# Patient Record
Sex: Male | Born: 1965
Health system: Southern US, Community
[De-identification: ages and names within clinical notes are randomized; demographics above are authoritative.]

## PROBLEM LIST (undated history)

## (undated) DIAGNOSIS — I509 Heart failure, unspecified: Secondary | ICD-10-CM

## (undated) DIAGNOSIS — G4730 Sleep apnea, unspecified: Secondary | ICD-10-CM

## (undated) DIAGNOSIS — I10 Essential (primary) hypertension: Secondary | ICD-10-CM

## (undated) DIAGNOSIS — N419 Inflammatory disease of prostate, unspecified: Secondary | ICD-10-CM

## (undated) DIAGNOSIS — F329 Major depressive disorder, single episode, unspecified: Secondary | ICD-10-CM

## (undated) DIAGNOSIS — I743 Embolism and thrombosis of arteries of the lower extremities: Secondary | ICD-10-CM

## (undated) DIAGNOSIS — J40 Bronchitis, not specified as acute or chronic: Secondary | ICD-10-CM

## (undated) DIAGNOSIS — M109 Gout, unspecified: Secondary | ICD-10-CM

## (undated) DIAGNOSIS — J302 Other seasonal allergic rhinitis: Secondary | ICD-10-CM

## (undated) DIAGNOSIS — F32A Depression, unspecified: Secondary | ICD-10-CM

## (undated) DIAGNOSIS — J45909 Unspecified asthma, uncomplicated: Secondary | ICD-10-CM

## (undated) HISTORY — DX: Unspecified asthma, uncomplicated: J45.909

## (undated) HISTORY — DX: Embolism and thrombosis of arteries of the lower extremities: I74.3

## (undated) HISTORY — DX: Sleep apnea, unspecified: G47.30

## (undated) HISTORY — DX: Morbid (severe) obesity due to excess calories: E66.01

---

## 2013-01-11 DIAGNOSIS — M25559 Pain in unspecified hip: Secondary | ICD-10-CM | POA: Insufficient documentation

## 2013-01-11 DIAGNOSIS — H6692 Otitis media, unspecified, left ear: Secondary | ICD-10-CM | POA: Insufficient documentation

## 2013-01-11 DIAGNOSIS — B070 Plantar wart: Secondary | ICD-10-CM | POA: Insufficient documentation

## 2014-07-24 ENCOUNTER — Emergency Department (HOSPITAL_COMMUNITY): Payer: Medicaid Other

## 2014-07-24 ENCOUNTER — Inpatient Hospital Stay (HOSPITAL_COMMUNITY)
Admission: EM | Admit: 2014-07-24 | Discharge: 2014-08-05 | DRG: 253 | Disposition: A | Discharge status: Home / Self Care | Payer: Medicaid Other | Attending: Internal Medicine | Admitting: Internal Medicine

## 2014-07-24 ENCOUNTER — Encounter (HOSPITAL_COMMUNITY): Payer: Self-pay

## 2014-07-24 DIAGNOSIS — I743 Embolism and thrombosis of arteries of the lower extremities: Principal | ICD-10-CM | POA: Diagnosis present

## 2014-07-24 DIAGNOSIS — E876 Hypokalemia: Secondary | ICD-10-CM | POA: Diagnosis present

## 2014-07-24 DIAGNOSIS — N419 Inflammatory disease of prostate, unspecified: Secondary | ICD-10-CM | POA: Diagnosis present

## 2014-07-24 DIAGNOSIS — M1000 Idiopathic gout, unspecified site: Secondary | ICD-10-CM

## 2014-07-24 DIAGNOSIS — J302 Other seasonal allergic rhinitis: Secondary | ICD-10-CM | POA: Diagnosis present

## 2014-07-24 DIAGNOSIS — M109 Gout, unspecified: Secondary | ICD-10-CM | POA: Diagnosis present

## 2014-07-24 DIAGNOSIS — R52 Pain, unspecified: Secondary | ICD-10-CM

## 2014-07-24 DIAGNOSIS — Z79899 Other long term (current) drug therapy: Secondary | ICD-10-CM

## 2014-07-24 DIAGNOSIS — Z7951 Long term (current) use of inhaled steroids: Secondary | ICD-10-CM

## 2014-07-24 DIAGNOSIS — R339 Retention of urine, unspecified: Secondary | ICD-10-CM | POA: Diagnosis not present

## 2014-07-24 DIAGNOSIS — N179 Acute kidney failure, unspecified: Secondary | ICD-10-CM

## 2014-07-24 DIAGNOSIS — I739 Peripheral vascular disease, unspecified: Secondary | ICD-10-CM | POA: Diagnosis present

## 2014-07-24 DIAGNOSIS — R1031 Right lower quadrant pain: Secondary | ICD-10-CM

## 2014-07-24 DIAGNOSIS — N411 Chronic prostatitis: Secondary | ICD-10-CM | POA: Insufficient documentation

## 2014-07-24 DIAGNOSIS — I2699 Other pulmonary embolism without acute cor pulmonale: Secondary | ICD-10-CM

## 2014-07-24 DIAGNOSIS — F329 Major depressive disorder, single episode, unspecified: Secondary | ICD-10-CM | POA: Diagnosis present

## 2014-07-24 DIAGNOSIS — Z6841 Body Mass Index (BMI) 40.0 and over, adult: Secondary | ICD-10-CM

## 2014-07-24 DIAGNOSIS — J40 Bronchitis, not specified as acute or chronic: Secondary | ICD-10-CM | POA: Diagnosis present

## 2014-07-24 DIAGNOSIS — G4733 Obstructive sleep apnea (adult) (pediatric): Secondary | ICD-10-CM | POA: Diagnosis present

## 2014-07-24 DIAGNOSIS — J45909 Unspecified asthma, uncomplicated: Secondary | ICD-10-CM | POA: Diagnosis present

## 2014-07-24 DIAGNOSIS — M79661 Pain in right lower leg: Secondary | ICD-10-CM | POA: Diagnosis present

## 2014-07-24 DIAGNOSIS — Z9989 Dependence on other enabling machines and devices: Secondary | ICD-10-CM

## 2014-07-24 DIAGNOSIS — R1030 Lower abdominal pain, unspecified: Secondary | ICD-10-CM | POA: Diagnosis present

## 2014-07-24 DIAGNOSIS — I10 Essential (primary) hypertension: Secondary | ICD-10-CM | POA: Diagnosis present

## 2014-07-24 HISTORY — DX: Depression, unspecified: F32.A

## 2014-07-24 HISTORY — DX: Gout, unspecified: M10.9

## 2014-07-24 HISTORY — DX: Inflammatory disease of prostate, unspecified: N41.9

## 2014-07-24 HISTORY — DX: Bronchitis, not specified as acute or chronic: J40

## 2014-07-24 HISTORY — DX: Other seasonal allergic rhinitis: J30.2

## 2014-07-24 HISTORY — DX: Major depressive disorder, single episode, unspecified: F32.9

## 2014-07-24 HISTORY — DX: Essential (primary) hypertension: I10

## 2014-07-24 LAB — COMPREHENSIVE METABOLIC PANEL
ALBUMIN: 3.5 g/dL (ref 3.5–5.2)
ALT: 21 U/L (ref 0–53)
AST: 22 U/L (ref 0–37)
Alkaline Phosphatase: 70 U/L (ref 39–117)
Anion gap: 9 (ref 5–15)
BUN: 19 mg/dL (ref 6–23)
CALCIUM: 8.6 mg/dL (ref 8.4–10.5)
CO2: 30 mmol/L (ref 19–32)
CREATININE: 1.19 mg/dL (ref 0.50–1.35)
Chloride: 103 mmol/L (ref 96–112)
GFR calc Af Amer: 81 mL/min — ABNORMAL LOW (ref >90)
GFR calc non Af Amer: 70 mL/min — ABNORMAL LOW (ref >90)
GLUCOSE: 155 mg/dL — AB (ref 70–99)
POTASSIUM: 3.2 mmol/L — AB (ref 3.5–5.1)
Sodium: 142 mmol/L (ref 135–145)
Total Bilirubin: 0.6 mg/dL (ref 0.3–1.2)
Total Protein: 7.2 g/dL (ref 6.0–8.3)

## 2014-07-24 LAB — CBC WITH DIFFERENTIAL/PLATELET
BASOS ABS: 0 10*3/uL (ref 0.0–0.1)
Basophils Relative: 0 % (ref 0–1)
Eosinophils Absolute: 0.3 10*3/uL (ref 0.0–0.7)
Eosinophils Relative: 3 % (ref 0–5)
HCT: 39.9 % (ref 39.0–52.0)
Hemoglobin: 12.3 g/dL — ABNORMAL LOW (ref 13.0–17.0)
Lymphocytes Relative: 15 % (ref 12–46)
Lymphs Abs: 1.7 10*3/uL (ref 0.7–4.0)
MCH: 27.8 pg (ref 26.0–34.0)
MCHC: 30.8 g/dL (ref 30.0–36.0)
MCV: 90.3 fL (ref 78.0–100.0)
MONO ABS: 0.8 10*3/uL (ref 0.1–1.0)
Monocytes Relative: 7 % (ref 3–12)
NEUTROS ABS: 9 10*3/uL — AB (ref 1.7–7.7)
Neutrophils Relative %: 75 % (ref 43–77)
Platelets: 242 10*3/uL (ref 150–400)
RBC: 4.42 MIL/uL (ref 4.22–5.81)
RDW: 15 % (ref 11.5–15.5)
WBC: 11.9 10*3/uL — AB (ref 4.0–10.5)

## 2014-07-24 LAB — URINE MICROSCOPIC-ADD ON

## 2014-07-24 LAB — URINALYSIS, ROUTINE W REFLEX MICROSCOPIC
GLUCOSE, UA: NEGATIVE mg/dL
Hgb urine dipstick: NEGATIVE
Ketones, ur: NEGATIVE mg/dL
LEUKOCYTES UA: NEGATIVE
Nitrite: NEGATIVE
PH: 5.5 (ref 5.0–8.0)
Protein, ur: 30 mg/dL — AB
Specific Gravity, Urine: 1.028 (ref 1.005–1.030)
Urobilinogen, UA: 1 mg/dL (ref 0.0–1.0)

## 2014-07-24 LAB — LIPASE, BLOOD: Lipase: 27 U/L (ref 11–59)

## 2014-07-24 LAB — I-STAT TROPONIN, ED: Troponin i, poc: 0 ng/mL (ref 0.00–0.08)

## 2014-07-24 MED ORDER — SODIUM CHLORIDE 0.9 % IV SOLN
Freq: Once | INTRAVENOUS | Status: AC
  Administered 2014-07-24: 22:00:00 via INTRAVENOUS

## 2014-07-24 MED ORDER — KETOROLAC TROMETHAMINE 30 MG/ML IJ SOLN
30.0000 mg | Freq: Once | INTRAMUSCULAR | Status: AC
  Administered 2014-07-24: 30 mg via INTRAVENOUS
  Filled 2014-07-24: qty 1

## 2014-07-24 MED ORDER — ONDANSETRON HCL 4 MG/2ML IJ SOLN
4.0000 mg | Freq: Once | INTRAMUSCULAR | Status: AC
  Administered 2014-07-24: 4 mg via INTRAVENOUS
  Filled 2014-07-24: qty 2

## 2014-07-24 MED ORDER — IOHEXOL 300 MG/ML  SOLN
125.0000 mL | Freq: Once | INTRAMUSCULAR | Status: AC | PRN
  Administered 2014-07-24: 125 mL via INTRAVENOUS

## 2014-07-24 NOTE — ED Notes (Signed)
Patient arrives by EMS with complaints of RLQ pain and groin pain/pain with urination.  Symptoms started last Sunday.

## 2014-07-24 NOTE — ED Notes (Signed)
Bed: WA01 Expected date:  Expected time:  Means of arrival:  Comments: EMS male abdominal pain

## 2014-07-24 NOTE — ED Notes (Signed)
Provider did not want EKG done.  Stored if other symptoms develop.

## 2014-07-24 NOTE — ED Provider Notes (Addendum)
CSN: [id number]     Arrival date & time 07/24/14  2056 History   First MD Initiated Contact with Patient 07/24/14 2104     Chief Complaint  Patient presents with  . Groin Pain  . Abdominal Pain     (Consider location/radiation/quality/duration/timing/severity/associated sxs/prior Treatment) HPI Patient presents with new right inguinal pain.  Pain began 4 days ago, without clear precipitant.  Patient is a somewhat similar episodes in the past, though with less severe pain, and without similar exacerbating factors, such as standing, bending. Pain is severe, sore, occasionally radiating inferiorly towards the scrotum. There is associated nausea, but no vomiting. No dysuria, though there is a pressure sensation. No scrotal changes. Patient does acknowledge a history of prostatitis in the distant past, denies a history of other GU pathology. Patient has had some relief with ibuprofen.  Past Medical History  Diagnosis Date  . Hypertension   . Gout   . Prostatitis   . Bronchitis   . Depression   . Seasonal allergies    History reviewed. No pertinent past surgical history. No family history on file. History  Substance Use Topics  . Smoking status: Never Smoker   . Smokeless tobacco: Not on file  . Alcohol Use: No    Review of Systems  Constitutional:       Per HPI, otherwise negative  HENT:       Per HPI, otherwise negative  Respiratory:       Per HPI, otherwise negative  Cardiovascular:       Per HPI, otherwise negative  Gastrointestinal: Positive for nausea. Negative for vomiting.  Endocrine:       Negative aside from HPI  Genitourinary:       Neg aside from HPI   Musculoskeletal:       Per HPI, otherwise negative  Skin: Negative.   Neurological: Negative for syncope.      Allergies  Review of patient's allergies indicates no known allergies.  Home Medications   Prior to Admission medications   Medication Sig Start Date End Date Taking? Authorizing Provider   allopurinol (ZYLOPRIM) 300 MG tablet Take 300 mg by mouth daily.   Yes Historical Provider, MD  amLODipine (NORVASC) 10 MG tablet Take 10 mg by mouth daily.   Yes Historical Provider, MD  desloratadine (CLARINEX) 5 MG tablet Take 5 mg by mouth daily.   Yes Historical Provider, MD  doxazosin (CARDURA) 4 MG tablet Take 4 mg by mouth daily.   Yes Historical Provider, MD  Fluticasone-Salmeterol (ADVAIR) 100-50 MCG/DOSE AEPB Inhale 1 puff into the lungs 2 (two) times daily.   Yes Historical Provider, MD  hydrochlorothiazide (HYDRODIURIL) 25 MG tablet Take 25 mg by mouth daily.   Yes Historical Provider, MD  lisinopril (PRINIVIL,ZESTRIL) 40 MG tablet Take 40 mg by mouth daily.   Yes Historical Provider, MD  metoprolol (LOPRESSOR) 100 MG tablet Take 100 mg by mouth 2 (two) times daily.   Yes Historical Provider, MD  mometasone (NASONEX) 50 MCG/ACT nasal spray Place 1 spray into the nose 2 (two) times daily.   Yes Historical Provider, MD  Multiple Vitamin (MULTIVITAMIN WITH MINERALS) TABS tablet Take 1 tablet by mouth daily.   Yes Historical Provider, MD  naproxen sodium (ANAPROX) 220 MG tablet Take 220 mg by mouth 2 (two) times daily with a meal.   Yes Historical Provider, MD  potassium chloride SA (K-DUR,KLOR-CON) 20 MEQ tablet Take 20 mEq by mouth daily.   Yes Historical Provider, MD  sertraline (ZOLOFT)  100 MG tablet Take 200 mg by mouth daily.   Yes Historical Provider, MD   BP 154/100 mmHg  Pulse 80  Temp(Src) 97.8 F (36.6 C) (Oral)  Resp 18  SpO2 97% Physical Exam  Constitutional: He is oriented to person, place, and time. He appears well-developed. No distress.  Large male resting in gurney, no distress  HENT:  Head: Normocephalic and atraumatic.  Eyes: Conjunctivae and EOM are normal.  Cardiovascular: Normal rate and regular rhythm.   Pulmonary/Chest: Effort normal. No stridor. No respiratory distress.  Abdominal: He exhibits no distension.  Genitourinary:     Musculoskeletal: He  exhibits no edema.       Legs: Neurological: He is alert and oriented to person, place, and time.  Skin: Skin is warm and dry.  Psychiatric: He has a normal mood and affect.  Nursing note and vitals reviewed.   ED Course  Procedures (including critical care time) Labs Review Labs Reviewed  CBC WITH DIFFERENTIAL/PLATELET - Abnormal; Notable for the following:    WBC 11.9 (*)    Hemoglobin 12.3 (*)    Neutro Abs 9.0 (*)    All other components within normal limits  COMPREHENSIVE METABOLIC PANEL - Abnormal; Notable for the following:    Potassium 3.2 (*)    Glucose, Bld 155 (*)    GFR calc non Af Amer 70 (*)    GFR calc Af Amer 81 (*)    All other components within normal limits  URINALYSIS, ROUTINE W REFLEX MICROSCOPIC - Abnormal; Notable for the following:    Color, Urine AMBER (*)    Bilirubin Urine SMALL (*)    Protein, ur 30 (*)    All other components within normal limits  URINE MICROSCOPIC-ADD ON - Abnormal; Notable for the following:    Bacteria, UA FEW (*)    Casts HYALINE CASTS (*)    All other components within normal limits  LIPASE, BLOOD  I-STAT TROPOININ, ED    Imaging Review Ct Abdomen Pelvis Wo Contrast  07/24/2014   CLINICAL DATA:  Right inguinal pain for 5 days.  EXAM: CT ABDOMEN AND PELVIS WITHOUT CONTRAST  TECHNIQUE: Multidetector CT imaging of the abdomen and pelvis was performed following the standard protocol without IV contrast.  COMPARISON:  None.  FINDINGS: No urinary calculi are evident. There is no hydronephrosis or ureteral dilatation. There is an upper pole right renal cyst measuring 3.3 cm, not characterized in the absence of intravenous contrast.  The appendix is normal.  There is inflammatory stranding surrounding the right common femoral artery and vein in the inguinal region, extending proximally around the right external iliac artery and vein and continuing all the way up to the level of the aortic bifurcation. This inflammatory stranding is not  accompanied by a drainable fluid collection or soft tissue gas. No adenopathy is evident. There is no hernia. No other inflammatory changes are evident elsewhere in the abdomen or pelvis.  There are grossly unremarkable unenhanced appearances of the liver, spleen, pancreas and adrenals. Mesentery and bowel appear unremarkable except for uncomplicated colonic diverticulosis. No significant abnormalities are evident in the lower chest.  IMPRESSION: 1. Inflammatory-appearing stranding around the right common femoral artery and vein in the inguinal region, extending proximally up to the level of the aortic bifurcation. This might represent a thrombophlebitis. 2. No evidence of an inguinal hernia, adenopathy or mass. 3. No urinary calculi. No hydronephrosis or ureteral dilatation. Normal appendix.   Electronically Signed   By: Rosey Bathaniel R Mitchell M.D.  On: 07/24/2014 22:01    Update: With abnormal results I discussed the patient's findings with our vascular surgery team. Dr.Chen recommends symptomatic management, without initiation of abx or heparin currently.  On repeat exam the patient appears calm. Subsequently reviewed the images myself, again discuss them with our radiology colleagues. We agreed to perform CT with contrast to better delineate whether there is intravascular occlusion or not.   Ct Pelvis W Contrast  07/25/2014   CLINICAL DATA:  Right inguinal pain.  EXAM: CT PELVIS WITH CONTRAST  TECHNIQUE: Multidetector CT imaging of the pelvis was performed using the standard protocol following the bolus administration of intravenous contrast.  CONTRAST:  OMNIPAQUE IOHEXOL 300 MG/ML  SOLN  COMPARISON:  Unenhanced CT performed earlier this evening  FINDINGS: A 65 second delay was used for contrast timing, intending to scanned during venous phase of the pelvis. Unfortunately, there is good opacification of the arteries but the veins are not opacified. There continues to be inflammatory -appearing  stranding around the right common femoral vein and right external iliac vein. This may represent a thrombophlebitis but venous patency is not conclusive given the lack of opacification of any of the veins in the area.  A few small nodes are visible in the region, likely reactive.  IMPRESSION: Inflammatory-appearing stranding opacities around the right common femoral vein. This may represent thrombophlebitis. Unfortunately we did not achieve good venous opacification to assess the patency of the vein.   Electronically Signed   By: Ellery Plunk M.D.   On: 07/25/2014 00:00    Following return of CT with contrast results I discussed this case with our radiology team again. Does not overtly appear as though there is vascular occlusion, though the study was suboptimal. With need to decisively exclude the possibility of perivascular or deep space pathology, radiology recommends additional imaging. Given the patient's hyperglycemia, decreased GFR, he does not seem eligible for repeat contrast currently, but will be admitted for hydration, repeat labs, consideration of additional imaging modalities tomorrow, possibly including MRI.   12:05 AM Patient aware of all results.  He states that he feels slightly better.   Cardiac 80 sinus rhythm normal Pulse oximetry 97% room air normal   MDM  Patient presents with ongoing right lower abdominal pain.  Patient has no history of prior abdominal intervention, though his initial evaluation suggests an inflammatory condition in the perivascular space in the right femoral area. Patient had improvement in his pain here. Given the need for definitive imaging, and the patient's ineligibility to receive additional contrast currently, the patient was admitted, with fluid resuscitation, planned repeat evaluation in the morning, consideration of additional imaging, including other available modalities to define his pathology.   Gerhard Munch, MD 07/25/14  [id number]  Gerhard Munch, MD 07/25/14 [phone]

## 2014-07-25 ENCOUNTER — Encounter (HOSPITAL_COMMUNITY): Payer: Self-pay | Admitting: Internal Medicine

## 2014-07-25 ENCOUNTER — Inpatient Hospital Stay (HOSPITAL_COMMUNITY): Payer: Medicaid Other

## 2014-07-25 DIAGNOSIS — Z9989 Dependence on other enabling machines and devices: Secondary | ICD-10-CM

## 2014-07-25 DIAGNOSIS — I739 Peripheral vascular disease, unspecified: Secondary | ICD-10-CM | POA: Diagnosis present

## 2014-07-25 DIAGNOSIS — I743 Embolism and thrombosis of arteries of the lower extremities: Secondary | ICD-10-CM | POA: Diagnosis present

## 2014-07-25 DIAGNOSIS — E876 Hypokalemia: Secondary | ICD-10-CM | POA: Diagnosis present

## 2014-07-25 DIAGNOSIS — M79661 Pain in right lower leg: Secondary | ICD-10-CM | POA: Diagnosis not present

## 2014-07-25 DIAGNOSIS — R1030 Lower abdominal pain, unspecified: Secondary | ICD-10-CM | POA: Diagnosis present

## 2014-07-25 DIAGNOSIS — J45909 Unspecified asthma, uncomplicated: Secondary | ICD-10-CM | POA: Diagnosis present

## 2014-07-25 DIAGNOSIS — Z6841 Body Mass Index (BMI) 40.0 and over, adult: Secondary | ICD-10-CM | POA: Diagnosis not present

## 2014-07-25 DIAGNOSIS — I10 Essential (primary) hypertension: Secondary | ICD-10-CM | POA: Diagnosis present

## 2014-07-25 DIAGNOSIS — N411 Chronic prostatitis: Secondary | ICD-10-CM | POA: Insufficient documentation

## 2014-07-25 DIAGNOSIS — J40 Bronchitis, not specified as acute or chronic: Secondary | ICD-10-CM | POA: Diagnosis present

## 2014-07-25 DIAGNOSIS — F329 Major depressive disorder, single episode, unspecified: Secondary | ICD-10-CM | POA: Diagnosis present

## 2014-07-25 DIAGNOSIS — Z9889 Other specified postprocedural states: Secondary | ICD-10-CM | POA: Diagnosis not present

## 2014-07-25 DIAGNOSIS — R1031 Right lower quadrant pain: Secondary | ICD-10-CM | POA: Diagnosis not present

## 2014-07-25 DIAGNOSIS — M109 Gout, unspecified: Secondary | ICD-10-CM | POA: Diagnosis present

## 2014-07-25 DIAGNOSIS — Z79899 Other long term (current) drug therapy: Secondary | ICD-10-CM | POA: Diagnosis not present

## 2014-07-25 DIAGNOSIS — M79609 Pain in unspecified limb: Secondary | ICD-10-CM

## 2014-07-25 DIAGNOSIS — G4733 Obstructive sleep apnea (adult) (pediatric): Secondary | ICD-10-CM | POA: Diagnosis present

## 2014-07-25 DIAGNOSIS — I70211 Atherosclerosis of native arteries of extremities with intermittent claudication, right leg: Secondary | ICD-10-CM | POA: Diagnosis not present

## 2014-07-25 DIAGNOSIS — N419 Inflammatory disease of prostate, unspecified: Secondary | ICD-10-CM

## 2014-07-25 DIAGNOSIS — J302 Other seasonal allergic rhinitis: Secondary | ICD-10-CM | POA: Diagnosis present

## 2014-07-25 DIAGNOSIS — M1000 Idiopathic gout, unspecified site: Secondary | ICD-10-CM

## 2014-07-25 DIAGNOSIS — Z7951 Long term (current) use of inhaled steroids: Secondary | ICD-10-CM | POA: Diagnosis not present

## 2014-07-25 DIAGNOSIS — R339 Retention of urine, unspecified: Secondary | ICD-10-CM | POA: Diagnosis not present

## 2014-07-25 DIAGNOSIS — N179 Acute kidney failure, unspecified: Secondary | ICD-10-CM | POA: Diagnosis not present

## 2014-07-25 DIAGNOSIS — I472 Ventricular tachycardia: Secondary | ICD-10-CM | POA: Diagnosis not present

## 2014-07-25 LAB — CBC
HCT: 38.5 % — ABNORMAL LOW (ref 39.0–52.0)
HEMOGLOBIN: 11.6 g/dL — AB (ref 13.0–17.0)
MCH: 27.4 pg (ref 26.0–34.0)
MCHC: 30.1 g/dL (ref 30.0–36.0)
MCV: 91 fL (ref 78.0–100.0)
PLATELETS: 253 10*3/uL (ref 150–400)
RBC: 4.23 MIL/uL (ref 4.22–5.81)
RDW: 15.1 % (ref 11.5–15.5)
WBC: 12 10*3/uL — ABNORMAL HIGH (ref 4.0–10.5)

## 2014-07-25 LAB — COMPREHENSIVE METABOLIC PANEL
ALT: 21 U/L (ref 0–53)
AST: 17 U/L (ref 0–37)
Albumin: 3.2 g/dL — ABNORMAL LOW (ref 3.5–5.2)
Alkaline Phosphatase: 68 U/L (ref 39–117)
Anion gap: 7 (ref 5–15)
BUN: 19 mg/dL (ref 6–23)
CO2: 31 mmol/L (ref 19–32)
CREATININE: 1.08 mg/dL (ref 0.50–1.35)
Calcium: 8.4 mg/dL (ref 8.4–10.5)
Chloride: 104 mmol/L (ref 96–112)
GFR calc non Af Amer: 79 mL/min — ABNORMAL LOW (ref >90)
Glucose, Bld: 107 mg/dL — ABNORMAL HIGH (ref 70–99)
Potassium: 3.7 mmol/L (ref 3.5–5.1)
Sodium: 142 mmol/L (ref 135–145)
Total Bilirubin: 0.5 mg/dL (ref 0.3–1.2)
Total Protein: 6.5 g/dL (ref 6.0–8.3)

## 2014-07-25 LAB — APTT: aPTT: 38 seconds — ABNORMAL HIGH (ref 24–37)

## 2014-07-25 LAB — PROTIME-INR
INR: 1.12 (ref 0.00–1.49)
Prothrombin Time: 14.5 seconds (ref 11.6–15.2)

## 2014-07-25 LAB — GLUCOSE, CAPILLARY: GLUCOSE-CAPILLARY: 101 mg/dL — AB (ref 70–99)

## 2014-07-25 MED ORDER — LISINOPRIL 40 MG PO TABS
40.0000 mg | ORAL_TABLET | Freq: Every day | ORAL | Status: DC
  Administered 2014-07-25 – 2014-08-03 (×9): 40 mg via ORAL
  Filled 2014-07-25 (×11): qty 1

## 2014-07-25 MED ORDER — DOXAZOSIN MESYLATE 4 MG PO TABS
4.0000 mg | ORAL_TABLET | Freq: Every day | ORAL | Status: DC
  Administered 2014-07-25 – 2014-08-04 (×9): 4 mg via ORAL
  Filled 2014-07-25 (×13): qty 1

## 2014-07-25 MED ORDER — ONDANSETRON HCL 4 MG/2ML IJ SOLN: 4.0000 mg | Freq: Three times a day (TID) | INTRAMUSCULAR | Status: DC | PRN

## 2014-07-25 MED ORDER — ALLOPURINOL 300 MG PO TABS
300.0000 mg | ORAL_TABLET | Freq: Every day | ORAL | Status: DC
  Administered 2014-07-25 – 2014-08-05 (×11): 300 mg via ORAL
  Filled 2014-07-25 (×13): qty 1

## 2014-07-25 MED ORDER — IOHEXOL 300 MG/ML  SOLN: 25.0000 mL | INTRAMUSCULAR | Status: AC

## 2014-07-25 MED ORDER — SERTRALINE HCL 100 MG PO TABS
200.0000 mg | ORAL_TABLET | Freq: Every day | ORAL | Status: DC
  Administered 2014-07-25 – 2014-08-04 (×11): 200 mg via ORAL
  Filled 2014-07-25 (×15): qty 2

## 2014-07-25 MED ORDER — ADULT MULTIVITAMIN W/MINERALS CH
1.0000 | ORAL_TABLET | Freq: Every day | ORAL | Status: DC
  Administered 2014-07-25 – 2014-08-05 (×11): 1 via ORAL
  Filled 2014-07-25 (×13): qty 1

## 2014-07-25 MED ORDER — INFLUENZA VAC SPLIT QUAD 0.5 ML IM SUSY
0.5000 mL | PREFILLED_SYRINGE | INTRAMUSCULAR | Status: AC
  Administered 2014-07-26: 0.5 mL via INTRAMUSCULAR
  Filled 2014-07-25 (×2): qty 0.5

## 2014-07-25 MED ORDER — LORATADINE 10 MG PO TABS
10.0000 mg | ORAL_TABLET | Freq: Every day | ORAL | Status: DC
  Administered 2014-07-25 – 2014-08-05 (×12): 10 mg via ORAL
  Filled 2014-07-25 (×13): qty 1

## 2014-07-25 MED ORDER — FLUTICASONE PROPIONATE 50 MCG/ACT NA SUSP
1.0000 | Freq: Every day | NASAL | Status: DC
  Administered 2014-07-25 – 2014-08-04 (×9): 1 via NASAL
  Filled 2014-07-25 (×3): qty 16

## 2014-07-25 MED ORDER — SODIUM CHLORIDE 0.9 % IV SOLN
INTRAVENOUS | Status: DC
  Administered 2014-07-25 – 2014-07-28 (×3): via INTRAVENOUS

## 2014-07-25 MED ORDER — MORPHINE SULFATE 2 MG/ML IJ SOLN
2.0000 mg | INTRAMUSCULAR | Status: DC | PRN
  Administered 2014-07-25 – 2014-07-28 (×4): 2 mg via INTRAVENOUS
  Filled 2014-07-25 (×4): qty 1

## 2014-07-25 MED ORDER — IOHEXOL 350 MG/ML SOLN
125.0000 mL | Freq: Once | INTRAVENOUS | Status: AC | PRN
  Administered 2014-07-25: 125 mL via INTRAVENOUS

## 2014-07-25 MED ORDER — SODIUM CHLORIDE 0.9 % IV SOLN
INTRAVENOUS | Status: AC
  Administered 2014-07-25: 02:00:00 via INTRAVENOUS

## 2014-07-25 MED ORDER — HYDRALAZINE HCL 20 MG/ML IJ SOLN: 5.0000 mg | INTRAMUSCULAR | Status: DC | PRN

## 2014-07-25 MED ORDER — METOPROLOL TARTRATE 100 MG PO TABS
100.0000 mg | ORAL_TABLET | Freq: Two times a day (BID) | ORAL | Status: DC
  Administered 2014-07-25 – 2014-08-02 (×16): 100 mg via ORAL
  Filled 2014-07-25 (×23): qty 1

## 2014-07-25 MED ORDER — HEPARIN SODIUM (PORCINE) 5000 UNIT/ML IJ SOLN
5000.0000 [IU] | Freq: Three times a day (TID) | INTRAMUSCULAR | Status: DC
  Administered 2014-07-25 – 2014-07-27 (×7): 5000 [IU] via SUBCUTANEOUS
  Filled 2014-07-25 (×11): qty 1

## 2014-07-25 MED ORDER — AMLODIPINE BESYLATE 10 MG PO TABS
10.0000 mg | ORAL_TABLET | Freq: Every day | ORAL | Status: DC
  Administered 2014-07-25 – 2014-08-03 (×9): 10 mg via ORAL
  Filled 2014-07-25 (×11): qty 1

## 2014-07-25 MED ORDER — MOMETASONE FURO-FORMOTEROL FUM 100-5 MCG/ACT IN AERO
2.0000 | INHALATION_SPRAY | Freq: Two times a day (BID) | RESPIRATORY_TRACT | Status: DC
  Administered 2014-07-25 – 2014-08-04 (×18): 2 via RESPIRATORY_TRACT
  Filled 2014-07-25 (×4): qty 8.8

## 2014-07-25 NOTE — H&P (Addendum)
[facility] History and Physical  Rickey Calderon ZOX:[id number] DOB: June 05, 1966 DOA: 07/24/2014  Referring physician: ED physician PCP: No PCP Per Patient  Specialists:   Chief Complaint:  Right groin pain  HPI: Rickey Calderon is a 49 y.o. male with past medical history hypertension, gout, chronic prostatitis, bronchitis, depression, seasonal allergy, OSA, who presents with right groin pain.  Patient reports that he started having right groin pain 4 days ago. It is constant, 8 out of 10 in severity, radiating to the scrotum. It is aggravated by coughing or straining. Patient does not have fever or chills. He has mild nausea, no vomiting or diarrhea.  Patient also reports having tenderness over right calf area, without swelling. No recent history of long distance traveling.  Of note, patient has chronic prostatitis. He was treated with antibiotics a year ago, but his symptoms has never resolved completely. He still has dysuria, but no discharge. Still has pain over scrotum area pain upon coughing or abdominal straining. Patient denies fever, chills, fatigue, headaches, cough, chest pain, SOB, skin rashes, joint pain or leg swelling.  Work up in the ED demonstrates inflammatory-appearing stranding opacities around the right common femoral vein. This may represent thrombophlebitis per radiologist. Unfortunately was uable to achieve good venous opacification to assess the patency of the vein per radiologist. Leukocytosis with WBC 11.9. Troponin negative. Urinalysis negative. Potassium 3.2. Patient is admitted to inpatient for further evaluation and treatment.  Review of Systems: As presented in the history of presenting illness, rest negative.  Where does patient live?  Currently lives in hotel (just moved out from apartment due to lease expired) Can patient participate in ADLs? Yes  Allergy: No Known Allergies  Past Medical History  Diagnosis Date  . Hypertension   . Gout   .  Prostatitis   . Bronchitis   . Depression   . Seasonal allergies     History reviewed. No pertinent past surgical history.  Social History:  reports that he has never smoked. He does not have any smokeless tobacco history on file. He reports that he does not drink alcohol. His drug history is not on file.  Family History:  Family History  Problem Relation Age of Onset  . Diabetes Father      Prior to Admission medications   Medication Sig Start Date End Date Taking? Authorizing Provider  allopurinol (ZYLOPRIM) 300 MG tablet Take 300 mg by mouth daily.   Yes Historical Provider, MD  amLODipine (NORVASC) 10 MG tablet Take 10 mg by mouth daily.   Yes Historical Provider, MD  desloratadine (CLARINEX) 5 MG tablet Take 5 mg by mouth daily.   Yes Historical Provider, MD  doxazosin (CARDURA) 4 MG tablet Take 4 mg by mouth daily.   Yes Historical Provider, MD  Fluticasone-Salmeterol (ADVAIR) 100-50 MCG/DOSE AEPB Inhale 1 puff into the lungs 2 (two) times daily.   Yes Historical Provider, MD  hydrochlorothiazide (HYDRODIURIL) 25 MG tablet Take 25 mg by mouth daily.   Yes Historical Provider, MD  lisinopril (PRINIVIL,ZESTRIL) 40 MG tablet Take 40 mg by mouth daily.   Yes Historical Provider, MD  metoprolol (LOPRESSOR) 100 MG tablet Take 100 mg by mouth 2 (two) times daily.   Yes Historical Provider, MD  mometasone (NASONEX) 50 MCG/ACT nasal spray Place 1 spray into the nose 2 (two) times daily.   Yes Historical Provider, MD  Multiple Vitamin (MULTIVITAMIN WITH MINERALS) TABS tablet Take 1 tablet by mouth daily.   Yes Historical Provider, MD  naproxen sodium (  ANAPROX) 220 MG tablet Take 220 mg by mouth 2 (two) times daily with a meal.   Yes Historical Provider, MD  potassium chloride SA (K-DUR,KLOR-CON) 20 MEQ tablet Take 20 mEq by mouth daily.   Yes Historical Provider, MD  sertraline (ZOLOFT) 100 MG tablet Take 200 mg by mouth daily.   Yes Historical Provider, MD    Physical Exam: Filed  Vitals:   07/24/14 2100 07/24/14 2326 07/25/14 0043  BP: 154/100 140/86 145/92  Pulse: 80 71 73  Temp: 97.8 F (36.6 C)  98.9 F (37.2 C)  TempSrc: Oral  Oral  Resp: SpO2: 97% 94% 97%   General: Not in acute distress HEENT:       Eyes: PERRL, EOMI, no scleral icterus       ENT: No discharge from the ears and nose, no pharynx injection, no tonsillar enlargement.        Neck: No JVD, no bruit, no mass felt. Cardiac: S1/S2, RRR, No murmurs, No gallops or rubs Pulm: Good air movement bilaterally. Clear to auscultation bilaterally. No rales, wheezing, rhonchi or rubs. Abd: Soft, nondistended, nontender, no rebound pain, no organomegaly, BS present GU:  There is tenderness over the right groin area. No obvious abnormalities over scrotum and penis. Ext: No edema bilaterally. 2+DP/PT pulse bilaterally. There is tenderness over right calf area, no swelling.  Musculoskeletal: No joint deformities, erythema, or stiffness, ROM full Skin: No rashes.  Neuro: Alert and oriented X3, cranial nerves II-XII grossly intact, muscle strength 5/5 in all extremeties, sensation to light touch intact.  Psych: Patient is not psychotic, no suicidal or hemocidal ideation.  Labs on Admission:  Basic Metabolic Panel:  Recent Labs Lab 07/24/14 2110  NA 142  K 3.2*  CL 103  CO2 30  GLUCOSE 155*  BUN 19  CREATININE 1.19  CALCIUM 8.6   Liver Function Tests:  Recent Labs Lab 07/24/14 2110  AST 22  ALT 21  ALKPHOS 70  BILITOT 0.6  PROT 7.2  ALBUMIN 3.5    Recent Labs Lab 07/24/14 2110  LIPASE 27   No results for input(s): AMMONIA in the last 168 hours. CBC:  Recent Labs Lab 07/24/14 2110  WBC 11.9*  NEUTROABS 9.0*  HGB 12.3*  HCT 39.9  MCV 90.3  PLT 242   Cardiac Enzymes: No results for input(s): CKTOTAL, CKMB, CKMBINDEX, TROPONINI in the last 168 hours.  BNP (last 3 results) No results for input(s): PROBNP in the last 8760 hours. CBG: No results for input(s):  GLUCAP in the last 168 hours.  Radiological Exams on Admission: Ct Abdomen Pelvis Wo Contrast  07/24/2014   CLINICAL DATA:  Right inguinal pain for 5 days.  EXAM: CT ABDOMEN AND PELVIS WITHOUT CONTRAST  TECHNIQUE: Multidetector CT imaging of the abdomen and pelvis was performed following the standard protocol without IV contrast.  COMPARISON:  None.  FINDINGS: No urinary calculi are evident. There is no hydronephrosis or ureteral dilatation. There is an upper pole right renal cyst measuring 3.3 cm, not characterized in the absence of intravenous contrast.  The appendix is normal.  There is inflammatory stranding surrounding the right common femoral artery and vein in the inguinal region, extending proximally around the right external iliac artery and vein and continuing all the way up to the level of the aortic bifurcation. This inflammatory stranding is not accompanied by a drainable fluid collection or soft tissue gas. No adenopathy is evident. There is no hernia. No other inflammatory changes are  evident elsewhere in the abdomen or pelvis.  There are grossly unremarkable unenhanced appearances of the liver, spleen, pancreas and adrenals. Mesentery and bowel appear unremarkable except for uncomplicated colonic diverticulosis. No significant abnormalities are evident in the lower chest.  IMPRESSION: 1. Inflammatory-appearing stranding around the right common femoral artery and vein in the inguinal region, extending proximally up to the level of the aortic bifurcation. This might represent a thrombophlebitis. 2. No evidence of an inguinal hernia, adenopathy or mass. 3. No urinary calculi. No hydronephrosis or ureteral dilatation. Normal appendix.   Electronically Signed   By: Ellery Plunkaniel R Mitchell M.D.   On: 07/24/2014 22:01   Ct Pelvis W Contrast  07/25/2014   CLINICAL DATA:  Right inguinal pain.  EXAM: CT PELVIS WITH CONTRAST  TECHNIQUE: Multidetector CT imaging of the pelvis was performed using the standard  protocol following the bolus administration of intravenous contrast.  CONTRAST:  125mL OMNIPAQUE IOHEXOL 300 MG/ML  SOLN  COMPARISON:  Unenhanced CT performed earlier this evening  FINDINGS: A 65 second delay was used for contrast timing, intending to scanned during venous phase of the pelvis. Unfortunately, there is good opacification of the arteries but the veins are not opacified. There continues to be inflammatory -appearing stranding around the right common femoral vein and right external iliac vein. This may represent a thrombophlebitis but venous patency is not conclusive given the lack of opacification of any of the veins in the area.  A few small nodes are visible in the region, likely reactive.  IMPRESSION: Inflammatory-appearing stranding opacities around the right common femoral vein. This may represent thrombophlebitis. Unfortunately we did not achieve good venous opacification to assess the patency of the vein.   Electronically Signed   By: Ellery Plunkaniel R Mitchell M.D.   On: 07/25/2014 00:00    EKG: not done  Assessment/Plan Principal Problem:   Groin pain Active Problems:   Hypokalemia   Right calf pain   Hypertension   Gout   Prostatitis   Bronchitis   Seasonal allergies   OSA on CPAP   Right groin pain: CT-abd/pelve showed possible thrombophlebitis in right common femoral vein, but not very confirmative. ED discussed with vascular surgeon, Dr. Imogene Burnhen, who  suggested to repeat image (CT or MRI with contrast) for definitive diagnosis. Since patient already received contrast today, will hydrate patient tonight and repeat imagine tomorrow after AM BMP. Per Dr. Imogene Burnhen, no need to start antibiotics or anticoagulant at this moment.  -Admit patient to MedSurg bed -IVF: NS 125 cc/h and hold HCTZ  -Symptomatic treatment: Zofran for nausea and morphine for pain - BMP in AM  Chronic prostatitis: Treated with antibiotics in the past. still has dysuria, no discharge. -get urine culture, blood  culture -HIV ab -GC/chalamydia Prob  Hypertension:  -continue lisinopril, amlodipine, metoprolol -Hold HCTZ since patient needs fluid - hydralazine prn  Cout: Stable -Continue allopurinol -Hold naproxen tonight to protect renal fx after constrast  Tenderness over right calf area: -Lower extremity Doppler to rule out DVT  Hypokalemia: Potassium 6.2 -Repleted  Bronchitis: Patient carries diagnosis of bronchitis. It is most likely due to asthma. Patient described that he was diagnosed with bronchitis as he was a child. He often has wheezing. Currently he is using Advair inhaler at home. He does not have cough, shortness of breath. -Continue home medications.  OSA: -CPAP   DVT ppx: SQ Heparin      Code Status: Full code Family Communication: None at bed side.      Disposition Plan:  Admit to inpatient   Date of Service 07/25/2014    Lorretta Harp [facility] Pager [phone]  If 7PM-7AM, please contact night-coverage [email or web address] Password [email or web address] 07/25/2014, 1:12 AM

## 2014-07-25 NOTE — Progress Notes (Signed)
[facility]  Reviewed admission note, examined patient and discussed plan.   Principal Problem:   Groin pain - possibility of thrombophlebitis in right groin- CT unclear- plan to repeat CT with contrast vs MRI after hydration as recommended by vasc surgery  Active Problems:   Hypokalemia - resolved    Right calf pain - lower extermity duplex has been ordered by the admitting physician    Hypertension - cont Lisinopril, Doxazosin, Amlodipine, Metoprolol and Hydralazine PRN    Gout - cont Allopurinol    Prostatitis, chronic - c/o dysuria- GC Chlamydia probe ordered by admitting doctor- further f/u as outpt    OSA on CPAP   Calvert CantorSaima Uldine Fuster, MD

## 2014-07-25 NOTE — Progress Notes (Signed)
VASCULAR LAB PRELIMINARY  PRELIMINARY  PRELIMINARY  PRELIMINARY  Right lower extremity venous duplex completed.    Preliminary report:  Right:  No evidence of DVT, superficial thrombosis, or Baker's cyst.  Rickey Calderon, RVS 07/25/2014, 4:01 PM

## 2014-07-25 NOTE — Progress Notes (Signed)
Pt stated that he would self administer CPAP when ready for bed.  Current settings are Auto CPAP 8-20 CMH20 via [facility].  Pt instructed to call RT if any complications should arise.  RT to monitor and assess as needed.

## 2014-07-25 NOTE — Progress Notes (Signed)
Provided patient with the affordable care act enrollment phone number and informed him he had until 07/27/14 to enroll for health insurance as stated on the website [email or web address].  Barrie LymeVance, Fremon Zacharia E RN 10:05 PM 07/25/2014

## 2014-07-25 NOTE — Progress Notes (Signed)
Pt stated that he would self administer CPAP when ready for bed.  Current settings are Auto CPAP 8-20 CMh20 via [facility].  Pt advised to call is any assistance is required.  RT to monitor and assess as needed.

## 2014-07-25 NOTE — Care Management Note (Addendum)
    Page 1 of 2   08/05/2014     4:29:38 PM CARE MANAGEMENT NOTE 08/05/2014  Patient:  Rickey Calderon,Rickey Calderon   Account Number:  [id number]  Date Initiated:  07/25/2014  Documentation initiated by:  Lorenda IshiharaPEELE,SUZANNE  Subjective/Objective Assessment:   49 yo male admitted with groin/abd pain. PTA lived at home with spouse, currently living in hotel, [place]     Action/Plan:   Home when stable   Anticipated DC Date:  08/06/2014   Anticipated DC Plan:  HOME/SELF CARE  In-house referral  Clinical Social Leisure centre managerWorker  Financial Counselor      DC Planning Services  Medication Assistance  CM consult  PCP issues      Choice offered to / List presented to:     DME arranged  [facility]      DME agency  [facility]        Status of service:  Completed, signed off Medicare Important Message given?   (If response is "NO", the following Medicare IM given date fields will be blank) Date Medicare IM given:   Medicare IM given by:   Date Additional Medicare IM given:   Additional Medicare IM given by:    Discharge Disposition:  HOME/SELF CARE  Per UR Regulation:  Reviewed for med. necessity/level of care/duration of stay  If discussed at Long Length of Stay Meetings, dates discussed:   08/05/2014    Comments:  08/05/14- 1130- Donn PieriniKristi Reyan Helle RN, BSN [phone] Pt for d/c home today- f/u appointment and [facility] 08/07/14 at 9:00 adn 10:00 respectively- spoke with pt at bedside- and given info on clinic and appointments also discussed meds- per pt he has enough meds to get him by until the appointment on Thurs. have explained to pt that he can take his scripts to the clinic to get filled either when he is discharged or on Thurs. when he goes to the appointment- explained if there where any new meds pt needed to go ahead and go by clinic- pt informed that CM is unable to assist with any pain meds. - [facility]- RW to be delivered to room prior to d/c.  07-25-14 Lorenda IshiharaSuzanne Peele RN CM 1100 Spoke with patient at bedside. [place], currently living in hotel with spouse and son. Son has autism and ADD, has disability and on Medicaid. Looking for resources for son, CSW consulted. Discussed with patient medications and PCP coverage. Provided patient with resources for primary care. Patient states he has some medications but has been taking antihypertensive every other day. Takes several antihypertensive meds, inhaler, gout meds, and Zoloft. Provided patient with coupons for most meds, some where $4 without coupons, discussed Match program. Patient would pay $21 with Match vs $35 with coupons provided. Match is only available every 12 months. Patient to review material and discuss with MD to decide how he would like to proceed with medication needs. Will continue to follow for d/c needs.

## 2014-07-26 ENCOUNTER — Ambulatory Visit (HOSPITAL_COMMUNITY)
Admit: 2014-07-26 | Discharge: 2014-07-26 | Disposition: A | Discharge status: Home / Self Care | Payer: Medicaid Other | Source: Ambulatory Visit | Attending: Internal Medicine | Admitting: Internal Medicine

## 2014-07-26 DIAGNOSIS — I776 Arteritis, unspecified: Secondary | ICD-10-CM

## 2014-07-26 LAB — URINE CULTURE: Colony Count: 6000

## 2014-07-26 LAB — GLUCOSE, CAPILLARY: Glucose-Capillary: 86 mg/dL (ref 70–99)

## 2014-07-26 MED ORDER — GADOBENATE DIMEGLUMINE 529 MG/ML IV SOLN: 20.0000 mL | Freq: Once | INTRAVENOUS | Status: AC | PRN

## 2014-07-26 NOTE — Progress Notes (Signed)
RT went to patient room to check with patient about CPAP. Patient was not ready yet and stated he would self administer. RT added water to water chamber for humidification. RT checked settings. Settings are auto ( 8-20 cmH2O). RT will assess and monitor as needed.

## 2014-07-26 NOTE — Progress Notes (Signed)
[facility] Progress Note   Rickey Calderon VWU:[id number] DOB: 20-Dec-1965 DOA: 07/24/2014 PCP: No PCP Per Patient  Brief narrative: Rickey Calderon is a 49 y.o. male admitted for right groin pain.    Subjective: The patient states that the pain in his right groin is less today than it was yesterday. His last dose of pain medication was 8 PM last night. He has no other complaints.  Assessment/Plan:  Groin pain/mild leukocytosis - possibility of thrombophlebitis in right groin- CT unclear-  repeat CT with contrast performed however it is still difficult to determine where the inflammatory changes are arising from. I have reviewed the films with vascular surgery and with radiology and it has recommended that an MRI be performed. As a scanner for his weight is not available at [facility] the patient is going to be transferred to [facility].  -According to the patient the pain has slightly improved today and he is not asking for pain medications. He has not ambulated either today to note if the pain is worse with ambulation.  Active Problems:  Hypokalemia - resolved   Right calf pain - lower extermity duplex is negative   Hypertension - cont Lisinopril, Doxazosin, Amlodipine, Metoprolol and Hydralazine PRN   Gout - cont Allopurinol   Prostatitis, chronic - No complaint anal or dysuria today   OSA on CPAP     Code Status: Full code Family Communication:  Disposition Plan: Home when stable DVT prophylaxis: Heparin  Consultants: None- phone discussions with vascular surgery only  Procedures: None  Antibiotics: Anti-infectives    None         Objective: Filed Weights   07/25/14 0117  Weight: 170 kg (374 lb 12.5 oz)    Intake/Output Summary (Last 24 hours) at 07/26/14 1329 Last data filed at 07/26/14 1000  Gross per 24 hour  Intake 1439.84 ml  Output   1700 ml  Net -260.16 ml     Vitals Filed Vitals:   07/25/14 2119 07/26/14 0525 07/26/14 0909  07/26/14 1100  BP: 145/93 131/90  134/80  Pulse: 67 57  69  Temp: 98.6 F (37 C) 98.4 F (36.9 C)  98.1 F (36.7 C)  TempSrc: Axillary Axillary  Oral  Resp: 18 21    Height:      Weight:      SpO2: 98% 94% 96% 94%    Exam: General: Weight alert oriented 3 No acute respiratory distress Lungs: Clear to auscultation bilaterally without wheezes or crackles Cardiovascular: Regular rate and rhythm without murmur gallop or rub normal S1 and S2 Abdomen: Mild tenderness in right groin without any other noted abnormality, nondistended, soft, bowel sounds positive, no rebound, no ascites, no appreciable mass Extremities: No significant cyanosis, clubbing, or edema bilateral lower extremities  Data Reviewed: Basic Metabolic Panel:  Recent Labs Lab 07/24/14 2110 07/25/14 0313  NA 142 142  K 3.2* 3.7  CL 103 104  CO2 30 31  GLUCOSE 155* 107*  BUN 19 19  CREATININE 1.19 1.08  CALCIUM 8.6 8.4   Liver Function Tests:  Recent Labs Lab 07/24/14 2110 07/25/14 0313  AST 22 17  ALT 21 21  ALKPHOS 70 68  BILITOT 0.6 0.5  PROT 7.2 6.5  ALBUMIN 3.5 3.2*    Recent Labs Lab 07/24/14 2110  LIPASE 27   No results for input(s): AMMONIA in the last 168 hours. CBC:  Recent Labs Lab 07/24/14 2110 07/25/14 0157  WBC 11.9* 12.0*  NEUTROABS 9.0*  --  HGB 12.3* 11.6*  HCT 39.9 38.5*  MCV 90.3 91.0  PLT 242 253   Cardiac Enzymes: No results for input(s): CKTOTAL, CKMB, CKMBINDEX, TROPONINI in the last 168 hours. BNP (last 3 results) No results for input(s): PROBNP in the last 8760 hours. CBG:  Recent Labs Lab 07/25/14 0802 07/26/14 0750  GLUCAP 101* 86    No results found for this or any previous visit (from the past 240 hour(s)).   Studies:  Recent x-ray studies have been reviewed in detail by the Attending Physician  Scheduled Meds:  Scheduled Meds: . allopurinol  300 mg Oral Daily  . amLODipine  10 mg Oral Daily  . doxazosin  4 mg Oral Daily  .  fluticasone  1 spray Each Nare Daily  . heparin  5,000 Units Subcutaneous 3 times per day  . lisinopril  40 mg Oral Daily  . loratadine  10 mg Oral Daily  . metoprolol  100 mg Oral BID  . mometasone-formoterol  2 puff Inhalation BID  . multivitamin with minerals  1 tablet Oral Daily  . sertraline  200 mg Oral Daily   Continuous Infusions: . sodium chloride 10 mL/hr at 07/25/14 2314    Time spent on care of this patient: 35 min   Leonard Hendler, MD 07/26/2014, 1:29 PM  LOS: 2 days   [facility] Office  [phone] Pager - Text Page per [email or web address]  If 7PM-7AM, please contact night-coverage [email or web address]

## 2014-07-26 NOTE — Clinical Social Work Psychosocial (Signed)
Clinical Social Work Department BRIEF PSYCHOSOCIAL ASSESSMENT 07/26/2014  Patient:  Rickey Calderon,Rickey Calderon     Account Number:  [id number]     Admit date:  07/24/2014  Clinical Social Worker:  Rickey BubaKujawa,Rian Koon, CLINICAL SOCIAL WORKER  Date/Time:  07/26/2014 03:57 PM  Referred by:  Physician  Date Referred:  07/25/2014 Referred for  Other - See comment  Other - See comment   Other Referral:   Interview type:  Patient Other interview type:    PSYCHOSOCIAL DATA Living Status:  FAMILY Admitted from facility:   Level of care:   Primary support name:  Rickey Calderon Primary support relationship to patient:  SPOUSE Degree of support available:   medium, pt's wife does not work but has congestive heart failure    CURRENT CONCERNS  Other Concerns:    SOCIAL WORK ASSESSMENT / PLAN CSW prompted pt to discuss history and need for services  CSW provided active listening and explored pt's support system in the community  CSW discussed services that are available to pt and his family in the community   Assessment/plan status:  [facility] to [facility] Other assessment/ plan:   Information/referral to community resources:   CSW will provide pt's wife with a list of doctors that are available to provide medication management to his son who has Autism and ADHD    PATIENT'S/FAMILY'S RESPONSE TO PLAN OF CARE: Pt very pleasant and cooperative with CSW  Pt [place] with a promise of an apartment only to come and have apartment state that the residents were not leaving the apartment and they could not accommodate him with another one  Pt stated that since December 14th he, his wife and son have been living in a hotel ([facility]).  Pt stated that his wife does not work and home schools his son.  Pt stated that his wife has congestive heart failure and is going to school on line.  Pt stated that his son has autism and adhd and can be aggressive so they have home schooled him  for the past year.  Pt stated that he currently has no vehicle to get around and no job and that he was  looking for employment before being admitted to the hospital. Pt stated that it has been hard for him and his wife because they are not experts with son's diagnoses and he is becoming aggressive with both him and his wife   Rickey Bubaegina Zale Marcotte, LCSW [facility] Clinical Social Worker - Weekend Coverage cell #: [phone]

## 2014-07-27 DIAGNOSIS — I70221 Atherosclerosis of native arteries of extremities with rest pain, right leg: Secondary | ICD-10-CM

## 2014-07-27 LAB — SEDIMENTATION RATE: Sed Rate: 51 mm/hr — ABNORMAL HIGH (ref 0–16)

## 2014-07-27 LAB — HEPARIN LEVEL (UNFRACTIONATED): HEPARIN UNFRACTIONATED: 0.26 [IU]/mL — AB (ref 0.30–0.70)

## 2014-07-27 MED ORDER — HEPARIN BOLUS VIA INFUSION
2500.0000 [IU] | Freq: Once | INTRAVENOUS | Status: AC
  Administered 2014-07-27: 2500 [IU] via INTRAVENOUS
  Filled 2014-07-27 (×2): qty 2500

## 2014-07-27 MED ORDER — HEPARIN (PORCINE) IN NACL 100-0.45 UNIT/ML-% IJ SOLN
1700.0000 [IU]/h | INTRAMUSCULAR | Status: DC
  Filled 2014-07-27 (×3): qty 250

## 2014-07-27 MED ORDER — HEPARIN (PORCINE) IN NACL 100-0.45 UNIT/ML-% IJ SOLN
1900.0000 [IU]/h | INTRAMUSCULAR | Status: DC
  Administered 2014-07-28 (×3): 1900 [IU]/h via INTRAVENOUS
  Filled 2014-07-27 (×2): qty 250

## 2014-07-27 NOTE — Progress Notes (Addendum)
[facility] Progress Note   Rickey Calderon ZOX:[id number] DOB: 1966/03/08 DOA: 07/24/2014 PCP: No PCP Per Patient  Brief narrative: Rickey Calderon is a 49 y.o. male admitted for right groin pain.    Subjective: Continues to have right hip pain.   Assessment/Plan:  Groin pain/mild leukocytosis - possibility of thrombophlebitis with thrombus in right groin- have asked for vascular surgery- will start Heparin infusion -According to the patient the pain has slightly improved   Active Problems:  Hypokalemia - resolved   Right calf pain - lower extermity duplex is negative   Hypertension - cont Lisinopril, Doxazosin, Amlodipine, Metoprolol and Hydralazine PRN   Gout - cont Allopurinol   Prostatitis, chronic - No complaint of dysuria    OSA on CPAP     Code Status: Full code Family Communication:  Disposition Plan: Home when stable DVT prophylaxis: Heparin  Consultants: None- phone discussions with vascular surgery only  Procedures: None  Antibiotics: Anti-infectives    None         Objective: Filed Weights   07/25/14 0117  Weight: 170 kg (374 lb 12.5 oz)    Intake/Output Summary (Last 24 hours) at 07/27/14 1359 Last data filed at 07/27/14 0908  Gross per 24 hour  Intake 557.17 ml  Output   1175 ml  Net -617.83 ml     Vitals Filed Vitals:   07/26/14 2230 07/27/14 0604 07/27/14 0829 07/27/14 1033  BP: 138/95 145/98  130/90  Pulse: 77 78  65  Temp: 99.3 F (37.4 C) 98 F (36.7 C)    TempSrc: Oral Oral    Resp: 18 18    Height:      Weight:      SpO2: 92% 94% 100%     Exam: General: Weight alert oriented 3 No acute respiratory distress Lungs: Clear to auscultation bilaterally without wheezes or crackles Cardiovascular: Regular rate and rhythm without murmur gallop or rub normal S1 and S2 Abdomen: Mild tenderness in right groin without any other noted abnormality, nondistended, soft, bowel sounds positive, no rebound, no  ascites, no appreciable mass Extremities: No significant cyanosis, clubbing, or edema bilateral lower extremities  Data Reviewed: Basic Metabolic Panel:  Recent Labs Lab 07/24/14 2110 07/25/14 0313  NA 142 142  K 3.2* 3.7  CL 103 104  CO2 30 31  GLUCOSE 155* 107*  BUN 19 19  CREATININE 1.19 1.08  CALCIUM 8.6 8.4   Liver Function Tests:  Recent Labs Lab 07/24/14 2110 07/25/14 0313  AST 22 17  ALT 21 21  ALKPHOS 70 68  BILITOT 0.6 0.5  PROT 7.2 6.5  ALBUMIN 3.5 3.2*    Recent Labs Lab 07/24/14 2110  LIPASE 27   No results for input(s): AMMONIA in the last 168 hours. CBC:  Recent Labs Lab 07/24/14 2110 07/25/14 0157  WBC 11.9* 12.0*  NEUTROABS 9.0*  --   HGB 12.3* 11.6*  HCT 39.9 38.5*  MCV 90.3 91.0  PLT 242 253   Cardiac Enzymes: No results for input(s): CKTOTAL, CKMB, CKMBINDEX, TROPONINI in the last 168 hours. BNP (last 3 results) No results for input(s): PROBNP in the last 8760 hours. CBG:  Recent Labs Lab 07/25/14 0802 07/26/14 0750  GLUCAP 101* 86    Recent Results (from the past 240 hour(s))  Culture, blood (routine x 2)     Status: None (Preliminary result)   Collection Time: 07/25/14  1:19 AM  Result Value Ref Range Status   Specimen Description BLOOD RIGHT HAND  Final  Special Requests BOTTLES DRAWN AEROBIC ONLY 3CC  Final   Culture   Final           BLOOD CULTURE RECEIVED NO GROWTH TO DATE CULTURE WILL BE HELD FOR 5 DAYS BEFORE ISSUING A FINAL NEGATIVE REPORT Performed at [facility]    Report Status PENDING  Incomplete  Culture, blood (routine x 2)     Status: None (Preliminary result)   Collection Time: 07/25/14  1:54 AM  Result Value Ref Range Status   Specimen Description BLOOD RIGHT ARM  Final   Special Requests BOTTLES DRAWN AEROBIC ONLY 10CC  Final   Culture   Final           BLOOD CULTURE RECEIVED NO GROWTH TO DATE CULTURE WILL BE HELD FOR 5 DAYS BEFORE ISSUING A FINAL NEGATIVE REPORT Performed at [facility]    Report Status PENDING  Incomplete  Urine culture     Status: None   Collection Time: 07/25/14  8:25 AM  Result Value Ref Range Status   Specimen Description URINE, CLEAN CATCH  Final   Special Requests NONE  Final   Colony Count   Final    6,000 COLONIES/ML Performed at [facility]    Culture   Final    INSIGNIFICANT GROWTH Performed at [facility]    Report Status 07/26/2014 FINAL  Final     Studies:  Recent x-ray studies have been reviewed in detail by the Attending Physician  Scheduled Meds:  Scheduled Meds: . allopurinol  300 mg Oral Daily  . amLODipine  10 mg Oral Daily  . doxazosin  4 mg Oral Daily  . fluticasone  1 spray Each Nare Daily  . heparin  5,000 Units Subcutaneous 3 times per day  . lisinopril  40 mg Oral Daily  . loratadine  10 mg Oral Daily  . metoprolol  100 mg Oral BID  . mometasone-formoterol  2 puff Inhalation BID  . multivitamin with minerals  1 tablet Oral Daily  . sertraline  200 mg Oral Daily   Continuous Infusions: . sodium chloride 10 mL/hr at 07/25/14 2314    Time spent on care of this patient: 35 min   Diangelo Radel, MD 07/27/2014, 1:59 PM  LOS: 3 days   [facility] Office  [phone] Pager - Text Page per [email or web address]  If 7PM-7AM, please contact night-coverage [email or web address]

## 2014-07-27 NOTE — Progress Notes (Signed)
PHARMACY - HEPARIN (brief note)  IV heparin infusing @ 1700 units/hr for treatment of possible SFA thrombus  Heparin level = 0.26 (Goal 0.3-0.7) No complications of therapy noted  Plan:  Increase heparin to 1900 units/hr            Check heparin level 6 hr after rate increase  Terrilee FilesLeann Laquesha Holcomb, PharmD 07/27/14 @ 22:56

## 2014-07-27 NOTE — Consult Note (Signed)
Consult Note  Patient name: Rickey Calderon MRN: [id number] DOB: 11-23-1965 Sex: male  Consulting Physician:  Hospital Service  Reason for Consult:  Chief Complaint  Patient presents with  . Groin Pain  . Abdominal Pain    HISTORY OF PRESENT ILLNESS: This is a 49 yo obese male who began having right groin pain last Sunday.  He stated that he also had calf cramping when walking approximately 1 block.  It is constant, 8 out of 10 in severity, radiating to the scrotum. It is aggravated by coughing or straining.  Work up in the ED demonstrates inflammatory-appearing stranding opacities around the right common femoral vein. This may represent thrombophlebitis per radiologist. Unfortunately was uable to achieve good venous opacification to assess the patency of the vein per radiologist. Leukocytosis with WBC 11.9. Troponin negative. Urinalysis negative.  Vascular u/s was negative for DVT.  MRI for myositis suggests arteritis of right femoral vessels and possible thrombus in proximal SFA.  Heparin has been started.  Patient has history HTN and gout as well as morbid obesity.  He takes OTC ibuprofen occasionally.  He is a non-smoker  Past Medical History  Diagnosis Date  . Hypertension   . Gout   . Prostatitis   . Bronchitis   . Depression   . Seasonal allergies     History reviewed. No pertinent past surgical history.  History   Social History  . Marital Status: Married    Spouse Name: N/A    Number of Children: N/A  . Years of Education: N/A   Occupational History  . Not on file.   Social History Main Topics  . Smoking status: Never Smoker   . Smokeless tobacco: Not on file  . Alcohol Use: No  . Drug Use: Not on file  . Sexual Activity: Not on file   Other Topics Concern  . Not on file   Social History Narrative    Family History  Problem Relation Age of Onset  . Diabetes Father     Allergies as of 07/24/2014  . (No Known Allergies)    No current  facility-administered medications on file prior to encounter.   No current outpatient prescriptions on file prior to encounter.     REVIEW OF SYSTEMS: See HPI, o/w negative  PHYSICAL EXAMINATION: General: The patient appears their stated age.  Vital signs are BP 132/82 mmHg  Pulse 64  Temp(Src) 98.5 F (36.9 C) (Oral)  Resp 18  Ht $R'5\' 7"'rP$  (1.702 m)  Wt 374 lb 12.5 oz (170 kg)  BMI 58.69 kg/m2  SpO2 96% Pulmonary: Respirations are non-labored HEENT:  No gross abnormalities Abdomen: Soft and non-tender  Musculoskeletal: There are no major deformities.   Neurologic: No focal weakness or paresthesias are detected, Skin: There are no ulcer or rashes noted. Psychiatric: The patient has normal affect. Cardiovascular: There is a regular rate and rhythm without significant murmur appreciated.  Palpable femoral pulses, can not palpate right pedal  Diagnostic Studies: I have reviewed the following with radiology:  MRI: 1. 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 2. Possible thrombus in the proximal SFA with indeterminate degree of occlusion. Correlate with ABIs and clinical symptomatology.  CTA: 1. There is suboptimal venous opacification. There is persistent expansion at the junction of the right common femoral vein and right external iliac vein with surrounding inflammatory changes, which may reflect thrombophlebitis     Assessment:  Possible arteritis vs  thrombus in right femoral vessels Plan: Imaging to date has been sub-optimal, likely secondary to body habitus. I am concerned that the patienet has an occlusive process in his right groin.  I think the best way to further evaluate this is with angiography via a left femoral approach.  I discussed this with the patient.  If a percutaneous treatment is available, this would be ideal, given the patients risk from a groin incision.  [facility] be [facility] for  angiography.  I am going to send of a CRP and ESR.  Continue heparin.     Eldridge Abrahams, M.D. [facility] Office: [phone] Pager:  [phone]

## 2014-07-27 NOTE — Progress Notes (Signed)
ANTICOAGULATION CONSULT NOTE - Initial Consult  Pharmacy Consult for heparin Indication: possible proximal SFA thrombus  No Known Allergies  Patient Measurements: Height: 5\' 7"  (170.2 cm) Weight: (!) 374 lb 12.5 oz (170 kg) IBW/kg (Calculated) : 66.1 Heparin Dosing Weight: 108.8kg  Vital Signs: Temp: 98 F (36.7 C) (01/31 0604) Temp Source: Oral (01/31 0604) BP: 130/90 mmHg (01/31 1033) Pulse Rate: 65 (01/31 1033)  Labs:  Recent Labs  07/24/14 2110 07/25/14 0154 07/25/14 0157 07/25/14 0313  HGB 12.3*  --  11.6*  --   HCT 39.9  --  38.5*  --   PLT 242  --  253  --   APTT  --  38*  --   --   LABPROT  --  14.5  --   --   INR  --  1.12  --   --   CREATININE 1.19  --   --  1.08    Estimated Creatinine Clearance: 126 mL/min (by C-G formula based on Cr of 1.08).   Medical History: Past Medical History  Diagnosis Date  . Hypertension   . Gout   . Prostatitis   . Bronchitis   . Depression   . Seasonal allergies     Medications:  Scheduled:  . allopurinol  300 mg Oral Daily  . amLODipine  10 mg Oral Daily  . doxazosin  4 mg Oral Daily  . fluticasone  1 spray Each Nare Daily  . lisinopril  40 mg Oral Daily  . loratadine  10 mg Oral Daily  . metoprolol  100 mg Oral BID  . mometasone-formoterol  2 puff Inhalation BID  . multivitamin with minerals  1 tablet Oral Daily  . sertraline  200 mg Oral Daily   Infusions:  . sodium chloride 10 mL/hr at 07/25/14 2314    Assessment: 48 yoM, obese, admitted 1/29 w R groin pain. CT angio performed on admit showed possible thrombophlebitis. MRI showed possible SFA thrombus. Pharmacy is consulted to dose heparin for VTE.  Patient not on anticoagulation PTA  Baseline INR 1.12  Patient currently on heparin 5000 units SQ q8h for VTE prophylaxis, last dose given 1/31 at 0610  hgb slightly low on 1/29, platelets WNL  No bleeding has been noted  CrCl > 100 ml/min  Goal of Therapy:  Heparin level 0.3-0.7  units/ml Monitor platelets by anticoagulation protocol: Yes   Plan:  - heparin bolus via infusion 2500 units  - giving 1/2 of calculated loading dose due to heparin bolus this AM - heparin gtt to start at 1700 units/hr - 6 hour heparin level - daily heparin level and CBC - follow-up plans for long-term anticoagulation - pharmacy will follow up daily  Thank you for the consult.  Ross LudwigJesse Bogdan Vivona Akers, PharmD, BCPS Pager: [phone] Pharmacy: [phone] 07/27/2014 2:18 PM

## 2014-07-27 NOTE — Progress Notes (Signed)
Pt states he does not need any assistance with CPAP or mask application. Sterile water has already been placed in the humidifier. CPAP is set on auto titrate 8-20cmH2O on room air via ffm. Pt was encouraged to contact RT for assistance throughout the night if needed. RT will continue to monitor as needed.

## 2014-07-27 NOTE — Plan of Care (Signed)
Problem: Phase I Progression Outcomes Goal: Initial discharge plan identified Outcome: Not Progressing [facility].  Results pending.  MD requesting surgical consult.  Unable to plan d/c disposition until dx/tx determined.

## 2014-07-28 ENCOUNTER — Encounter (HOSPITAL_COMMUNITY)
Admission: EM | Disposition: A | Discharge status: Home / Self Care | Payer: Self-pay | Source: Home / Self Care | Attending: Internal Medicine

## 2014-07-28 DIAGNOSIS — I70211 Atherosclerosis of native arteries of extremities with intermittent claudication, right leg: Secondary | ICD-10-CM

## 2014-07-28 HISTORY — PX: LOWER EXTREMITY ANGIOGRAM: SHX5508

## 2014-07-28 HISTORY — PX: ABDOMINAL AORTAGRAM: SHX5454

## 2014-07-28 LAB — CBC
HCT: 40 % (ref 39.0–52.0)
HCT: 39.2 % (ref 39.0–52.0)
HEMOGLOBIN: 12 g/dL — AB (ref 13.0–17.0)
HEMOGLOBIN: 12 g/dL — AB (ref 13.0–17.0)
MCH: 27 pg (ref 26.0–34.0)
MCH: 27.1 pg (ref 26.0–34.0)
MCHC: 30 g/dL (ref 30.0–36.0)
MCHC: 30.6 g/dL (ref 30.0–36.0)
MCV: 90.1 fL (ref 78.0–100.0)
MCV: 88.5 fL (ref 78.0–100.0)
PLATELETS: 252 10*3/uL (ref 150–400)
Platelets: 272 10*3/uL (ref 150–400)
RBC: 4.44 MIL/uL (ref 4.22–5.81)
RBC: 4.43 MIL/uL (ref 4.22–5.81)
RDW: 14.7 % (ref 11.5–15.5)
RDW: 14.7 % (ref 11.5–15.5)
WBC: 9.8 10*3/uL (ref 4.0–10.5)
WBC: 8 10*3/uL (ref 4.0–10.5)

## 2014-07-28 LAB — BASIC METABOLIC PANEL
Anion gap: 7 (ref 5–15)
BUN: 14 mg/dL (ref 6–23)
CALCIUM: 8.8 mg/dL (ref 8.4–10.5)
CO2: 32 mmol/L (ref 19–32)
Chloride: 101 mmol/L (ref 96–112)
Creatinine, Ser: 1.12 mg/dL (ref 0.50–1.35)
GFR calc Af Amer: 87 mL/min — ABNORMAL LOW (ref >90)
GFR, EST NON AFRICAN AMERICAN: 75 mL/min — AB (ref >90)
GLUCOSE: 113 mg/dL — AB (ref 70–99)
POTASSIUM: 4.1 mmol/L (ref 3.5–5.1)
Sodium: 140 mmol/L (ref 135–145)

## 2014-07-28 LAB — HIV ANTIBODY (ROUTINE TESTING W REFLEX): HIV Screen 4th Generation wRfx: NONREACTIVE

## 2014-07-28 LAB — HEPARIN LEVEL (UNFRACTIONATED): HEPARIN UNFRACTIONATED: 0.5 [IU]/mL (ref 0.30–0.70)

## 2014-07-28 LAB — C-REACTIVE PROTEIN: CRP: 6.6 mg/dL — AB (ref <0.60)

## 2014-07-28 LAB — CREATININE, SERUM
Creatinine, Ser: 1.17 mg/dL (ref 0.50–1.35)
GFR calc Af Amer: 83 mL/min — ABNORMAL LOW (ref >90)
GFR, EST NON AFRICAN AMERICAN: 72 mL/min — AB (ref >90)

## 2014-07-28 LAB — GLUCOSE, CAPILLARY
Glucose-Capillary: 89 mg/dL (ref 70–99)
Glucose-Capillary: 104 mg/dL — ABNORMAL HIGH (ref 70–99)

## 2014-07-28 SURGERY — ABDOMINAL AORTAGRAM

## 2014-07-28 MED ORDER — HEPARIN (PORCINE) IN NACL 2-0.9 UNIT/ML-% IJ SOLN
INTRAMUSCULAR | Status: AC
  Filled 2014-07-28: qty 1000

## 2014-07-28 MED ORDER — ASPIRIN 81 MG PO CHEW
81.0000 mg | CHEWABLE_TABLET | Freq: Every day | ORAL | Status: DC
  Administered 2014-07-29 – 2014-08-05 (×7): 81 mg via ORAL
  Filled 2014-07-28 (×8): qty 1

## 2014-07-28 MED ORDER — ENOXAPARIN SODIUM 40 MG/0.4ML ~~LOC~~ SOLN: 40.0000 mg | SUBCUTANEOUS | Status: DC

## 2014-07-28 MED ORDER — MIDAZOLAM HCL 2 MG/2ML IJ SOLN
INTRAMUSCULAR | Status: AC
  Filled 2014-07-28: qty 2

## 2014-07-28 MED ORDER — FENTANYL CITRATE 0.05 MG/ML IJ SOLN
INTRAMUSCULAR | Status: AC
  Filled 2014-07-28: qty 2

## 2014-07-28 MED ORDER — ONDANSETRON HCL 4 MG/2ML IJ SOLN: 4.0000 mg | Freq: Four times a day (QID) | INTRAMUSCULAR | Status: DC | PRN

## 2014-07-28 MED ORDER — SODIUM CHLORIDE 0.9 % IV SOLN: INTRAVENOUS | Status: AC

## 2014-07-28 MED ORDER — LIDOCAINE HCL (PF) 1 % IJ SOLN
INTRAMUSCULAR | Status: AC
  Filled 2014-07-28: qty 30

## 2014-07-28 MED ORDER — ACETAMINOPHEN 325 MG PO TABS: 650.0000 mg | ORAL_TABLET | ORAL | Status: DC | PRN

## 2014-07-28 MED ORDER — ENOXAPARIN SODIUM 40 MG/0.4ML ~~LOC~~ SOLN
40.0000 mg | SUBCUTANEOUS | Status: DC
  Administered 2014-07-29 – 2014-07-31 (×3): 40 mg via SUBCUTANEOUS
  Filled 2014-07-28 (×4): qty 0.4

## 2014-07-28 NOTE — Progress Notes (Signed)
[facility] PROGRESS NOTE  Rickey Calderon ZOX:[id number] DOB: 1966-01-11 DOA: 07/24/2014 PCP: No PCP Per Patient  49 y/o obese male with OSA on CPAP,HTN, gout, and chr prostatitis admitted with rt groin pain and claudication. Pt transferred from [facility] to [facility] on 2/1 for Abdominal aortogram.   Assessment/Plan: Rt groin pain with claudication  placed on IV heparin. Seen by vascular ad underwent abdominal aortogram showing patent renal, bilateral common iliac, ext iliac and b/l hypogastric arteries. Flush occlusion of rt superficial femoral artery noted.  Occluded DP on right. Unclear if this is acute or chronic.  Dr Myra Gianotti discussed findings with Dr Butler Denmark and recommended 2D echo and CT angio of the chest. -dc IV heparin -pain control  HTN Continue amlodipine and metoprolol.  Gout  continue allopurinol  OSA  continue bedtime CPAP  DVT prophylaxis: sq lovenox  Diet: regular  Code Status: full Family Communication: none at bedside Disposition Plan: home once w/up completed   Consultants:  Vascular sx  Procedures:  Abdominal aortogram  Antibiotics:  none  HPI/Subjective: Seen and examined after returning from vascular lab. Denies any groin pain or claudication  Objective: Filed Vitals:   07/28/14 1307  BP: 165/106  Pulse:   Temp:   Resp:     Intake/Output Summary (Last 24 hours) at 07/28/14 1523 Last data filed at 07/28/14 0600  Gross per 24 hour  Intake   1038 ml  Output   2675 ml  Net  -1637 ml   Filed Weights   07/25/14 0117  Weight: 170 kg (374 lb 12.5 oz)    Exam:   General:  Middle aged obese male in NAD   HEENT: no pallor, moist mucosa  Chest: clear b/l, no added sounds  CVS: NS1&S2, no murmurs  GI: soft, NT, ND BS+  Ext: warm, no edema, absent rt Distal pulse  CNS: alert and oriented.       Data Reviewed: Basic Metabolic Panel:  Recent Labs Lab 07/24/14 2110 07/25/14 0313 07/28/14 0514  NA 142 142 140  K 3.2* 3.7  4.1  CL 103 104 101  CO2 30 31 32  GLUCOSE 155* 107* 113*  BUN CREATININE 1.19 1.08 1.12  CALCIUM 8.6 8.4 8.8   Liver Function Tests:  Recent Labs Lab 07/24/14 2110 07/25/14 0313  AST 22 17  ALT 21 21  ALKPHOS 70 68  BILITOT 0.6 0.5  PROT 7.2 6.5  ALBUMIN 3.5 3.2*    Recent Labs Lab 07/24/14 2110  LIPASE 27   No results for input(s): AMMONIA in the last 168 hours. CBC:  Recent Labs Lab 07/24/14 2110 07/25/14 0157 07/28/14 0514  WBC 11.9* 12.0* 9.8  NEUTROABS 9.0*  --   --   HGB 12.3* 11.6* 12.0*  HCT 39.9 38.5* 40.0  MCV 90.3 91.0 90.1  PLT 242 253 272   Cardiac Enzymes: No results for input(s): CKTOTAL, CKMB, CKMBINDEX, TROPONINI in the last 168 hours. BNP (last 3 results) No results for input(s): BNP in the last 8760 hours.  ProBNP (last 3 results) No results for input(s): PROBNP in the last 8760 hours.  CBG:  Recent Labs Lab 07/25/14 0802 07/26/14 0750 07/27/14 0737 07/28/14 0745  GLUCAP 101* 86 89 104*    Recent Results (from the past 240 hour(s))  Culture, blood (routine x 2)     Status: None (Preliminary result)   Collection Time: 07/25/14  1:19 AM  Result Value Ref Range Status   Specimen Description BLOOD RIGHT HAND  Final   Special Requests BOTTLES DRAWN AEROBIC ONLY 3CC  Final   Culture   Final           BLOOD CULTURE RECEIVED NO GROWTH TO DATE CULTURE WILL BE HELD FOR 5 DAYS BEFORE ISSUING A FINAL NEGATIVE REPORT Performed at [facility]    Report Status PENDING  Incomplete  Culture, blood (routine x 2)     Status: None (Preliminary result)   Collection Time: 07/25/14  1:54 AM  Result Value Ref Range Status   Specimen Description BLOOD RIGHT ARM  Final   Special Requests BOTTLES DRAWN AEROBIC ONLY 10CC  Final   Culture   Final           BLOOD CULTURE RECEIVED NO GROWTH TO DATE CULTURE WILL BE HELD FOR 5 DAYS BEFORE ISSUING A FINAL NEGATIVE REPORT Performed at [facility]    Report Status PENDING   Incomplete  Urine culture     Status: None   Collection Time: 07/25/14  8:25 AM  Result Value Ref Range Status   Specimen Description URINE, CLEAN CATCH  Final   Special Requests NONE  Final   Colony Count   Final    6,000 COLONIES/ML Performed at [facility]    Culture   Final    INSIGNIFICANT GROWTH Performed at [facility]    Report Status 07/26/2014 FINAL  Final     Studies: Mr Hip Right W Wo Contrast  07/27/2014   CLINICAL DATA:  Right groin pain. Edematous/inflammatory process around the common femoral vessels on CT.  EXAM: MRI OF THE RIGHT HIP WITHOUT AND WITH CONTRAST  TECHNIQUE: Multiplanar, multisequence MR imaging was performed both before and after administration of intravenous contrast.  CONTRAST:  20 mL MultiHance IV  COMPARISON:  CT 07/25/2014 and earlier studies  FINDINGS: Bones: Unremarkable  Articular cartilage and labrum  Articular cartilage:  Negative  Labrum:  Negative  Joint or bursal effusion  Joint effusion:  None  Bursae:  Negative  Muscles and tendons  Muscles and tendons:  Negative  Other findings  There is eccentric wall thickening in the distal common femoral artery, and more marked circumferential wall thickening in the proximal superficial femoral artery. There are surrounding edematous/inflammatory changes with circumferential perivascular enhancement after IV contrast administration. Signal in the proximal SFA suggesting intraluminal thrombus or plaque, with an indeterminate degree of luminal occlusion. Normal flow void in the adjacent common femoral vein.  IMPRESSION: 1. Negative right hip. 2. 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 3. Possible thrombus in the proximal SFA with indeterminate degree of occlusion. Correlate with ABIs and clinical symptomatology.   Electronically Signed   By: Oley Balmaniel  Hassell M.D.   On: 07/27/2014 13:04    Scheduled Meds: . allopurinol  300 mg Oral Daily  .  amLODipine  10 mg Oral Daily  . aspirin  81 mg Oral Daily  . doxazosin  4 mg Oral Daily  . [START ON 07/29/2014] enoxaparin (LOVENOX) injection  40 mg Subcutaneous Q24H  . fluticasone  1 spray Each Nare Daily  . lisinopril  40 mg Oral Daily  . loratadine  10 mg Oral Daily  . metoprolol  100 mg Oral BID  . mometasone-formoterol  2 puff Inhalation BID  . multivitamin with minerals  1 tablet Oral Daily  . sertraline  200 mg Oral Daily   Continuous Infusions: . sodium chloride 10 mL/hr at 07/28/14 0635  . sodium chloride  Time spent: 25 minutes    Rickey Calderon  [facility] Pager [phone] If 7PM-7AM, please contact night-coverage at [email or web address], password [facility] 07/28/2014, 3:23 PM  LOS: 4 days

## 2014-07-28 NOTE — H&P (View-Only) (Signed)
Consult Note  Patient name: Rickey Calderon MRN: [id number] DOB: 1965/12/04 Sex: male  Consulting Physician:  Hospital Service  Reason for Consult:  Chief Complaint  Patient presents with  . Groin Pain  . Abdominal Pain    HISTORY OF PRESENT ILLNESS: This is a 49 yo obese male who began having right groin pain last Sunday.  He stated that he also had calf cramping when walking approximately 1 block.  It is constant, 8 out of 10 in severity, radiating to the scrotum. It is aggravated by coughing or straining.  Work up in the ED demonstrates inflammatory-appearing stranding opacities around the right common femoral vein. This may represent thrombophlebitis per radiologist. Unfortunately was uable to achieve good venous opacification to assess the patency of the vein per radiologist. Leukocytosis with WBC 11.9. Troponin negative. Urinalysis negative.  Vascular u/s was negative for DVT.  MRI for myositis suggests arteritis of right femoral vessels and possible thrombus in proximal SFA.  Heparin has been started.  Patient has history HTN and gout as well as morbid obesity.  He takes OTC ibuprofen occasionally.  He is a non-smoker  Past Medical History  Diagnosis Date  . Hypertension   . Gout   . Prostatitis   . Bronchitis   . Depression   . Seasonal allergies     History reviewed. No pertinent past surgical history.  History   Social History  . Marital Status: Married    Spouse Name: N/A    Number of Children: N/A  . Years of Education: N/A   Occupational History  . Not on file.   Social History Main Topics  . Smoking status: Never Smoker   . Smokeless tobacco: Not on file  . Alcohol Use: No  . Drug Use: Not on file  . Sexual Activity: Not on file   Other Topics Concern  . Not on file   Social History Narrative    Family History  Problem Relation Age of Onset  . Diabetes Father     Allergies as of 07/24/2014  . (No Known Allergies)    No current  facility-administered medications on file prior to encounter.   No current outpatient prescriptions on file prior to encounter.     REVIEW OF SYSTEMS: See HPI, o/w negative  PHYSICAL EXAMINATION: General: The patient appears their stated age.  Vital signs are BP 132/82 mmHg  Pulse 64  Temp(Src) 98.5 F (36.9 C) (Oral)  Resp 18  Ht $R'5\' 7"'BI$  (1.702 m)  Wt 374 lb 12.5 oz (170 kg)  BMI 58.69 kg/m2  SpO2 96% Pulmonary: Respirations are non-labored HEENT:  No gross abnormalities Abdomen: Soft and non-tender  Musculoskeletal: There are no major deformities.   Neurologic: No focal weakness or paresthesias are detected, Skin: There are no ulcer or rashes noted. Psychiatric: The patient has normal affect. Cardiovascular: There is a regular rate and rhythm without significant murmur appreciated.  Palpable femoral pulses, can not palpate right pedal  Diagnostic Studies: I have reviewed the following with radiology:  MRI: 1. 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 2. Possible thrombus in the proximal SFA with indeterminate degree of occlusion. Correlate with ABIs and clinical symptomatology.  CTA: 1. There is suboptimal venous opacification. There is persistent expansion at the junction of the right common femoral vein and right external iliac vein with surrounding inflammatory changes, which may reflect thrombophlebitis     Assessment:  Possible arteritis vs  thrombus in right femoral vessels Plan: Imaging to date has been sub-optimal, likely secondary to body habitus. I am concerned that the patienet has an occlusive process in his right groin.  I think the best way to further evaluate this is with angiography via a left femoral approach.  I discussed this with the patient.  If a percutaneous treatment is available, this would be ideal, given the patients risk from a groin incision.  He will be transferred to [facility] tomorrow for  angiography.  I am going to send of a CRP and ESR.  Continue heparin.     Eldridge Abrahams, M.D. [facility] [place] Office: [phone] Pager:  [phone]

## 2014-07-28 NOTE — Progress Notes (Signed)
Doppler Left PT and Left DP noted post sheath pull.

## 2014-07-28 NOTE — Progress Notes (Signed)
Utilization review completed.  

## 2014-07-28 NOTE — Progress Notes (Signed)
ANTICOAGULATION CONSULT NOTE - Follow Up  Pharmacy Consult for heparin Indication: possible proximal SFA thrombus  No Known Allergies  Patient Measurements: Height: 5\' 7"  (170.2 cm) Weight: (!) 374 lb 12.5 oz (170 kg) IBW/kg (Calculated) : 66.1 Heparin Dosing Weight: 108.8kg  Vital Signs: Temp: 98.5 F (36.9 C) (02/01 0545) Temp Source: Oral (02/01 0545) BP: 132/94 mmHg (02/01 0545) Pulse Rate: 57 (02/01 0545)  Labs:  Recent Labs  07/27/14 2212 07/28/14 0514  HGB  --  12.0*  HCT  --  40.0  PLT  --  272  HEPARINUNFRC 0.26* 0.50    Estimated Creatinine Clearance: 126 mL/min (by C-G formula based on Cr of 1.08).   Medical History: Past Medical History  Diagnosis Date  . Hypertension   . Gout   . Prostatitis   . Bronchitis   . Depression   . Seasonal allergies     Medications:  Scheduled:  . allopurinol  300 mg Oral Daily  . amLODipine  10 mg Oral Daily  . doxazosin  4 mg Oral Daily  . fluticasone  1 spray Each Nare Daily  . lisinopril  40 mg Oral Daily  . loratadine  10 mg Oral Daily  . metoprolol  100 mg Oral BID  . mometasone-formoterol  2 puff Inhalation BID  . multivitamin with minerals  1 tablet Oral Daily  . sertraline  200 mg Oral Daily   Infusions:  . sodium chloride 10 mL/hr at 07/27/14 1900  . heparin 1,900 Units/hr (07/28/14 0502)    Assessment: 48 yoM, obese, admitted 1/29 w R groin pain. CT angio performed on admit showed possible thrombophlebitis. MRI showed possible SFA thrombus. Pharmacy is consulted to dose heparin for VTE. . Goal of Therapy:  Heparin level 0.3-0.7 units/ml Monitor platelets by anticoagulation protocol: Yes   Today, 07/28/2014 Heparin level therapeutic on 1900 units/hr No reported bleeding Hgb slightly low but stable Pltc WNL Plans noted [facility]  Plan:  1. Continue present heparin rate (1900 units/hr) 2. Confirmatory heparin level today at 11am 3. Follow heparin level, CBC  daily. 4. Await findings from angiography.  Elie Goodyandy Wilberth Damon, PharmD, BCPS Pager: [phone] 07/28/2014  6:21 AM

## 2014-07-28 NOTE — Op Note (Signed)
   PATIENT: Rickey Calderon   MRN: [id number] DOB: [birth year]    DATE OF PROCEDURE: 07/28/2014  INDICATIONS: Rickey Calderon is a 49 y.o. male with a history of right groin pain and also right leg claudication. He presents for arteriography given that the CT scan was suboptimal and visualizing the right common femoral artery and superficial femoral artery.  PROCEDURE:  1. Ultrasound-guided access to the left common femoral artery 2. Aortogram with bilateral iliac arteriogram 3. Selective catheterization of the right external iliac artery with right lower extremity runoff 4. Retrograde left femoral arteriogram with left lower extremity runoff  SURGEON: Di Kindlehristopher S. Edilia Boickson, MD, FACS  ANESTHESIA: local with sedation   EBL: minimal  TECHNIQUE: The patient was taken to the peripheral vascular lab and received 1 mg of Versed and 50 g of fentanyl. Both groins were prepped and draped in usual sterile fashion. This pain as had been taped superiorly. Under ultrasound guidance, after the skin was anesthetized, the left common femoral artery was cannulated with a micropuncture needle and a micropuncture sheath introduced over the wire. I then advanced a versa core wire through the micropuncture sheath and the micropuncture sheath was removed. I then advanced and straight catheter over the wire and exchanged the versa core wire for an Amplatz wire. A 5 French sheath was then placed over the Amplatz wire. A pigtail catheter was positioned at the L1 vertebral body and flush aortogram obtained. The catheter was in position above the aortic bifurcation and an oblique iliac projection was obtained.  The pigtail catheter was then exchanged for a crossover catheter which was positioned into the right common iliac artery. An angled Glidewire was advanced into the external iliac artery and then the crossover catheter exchanged for a straight catheter. Selective right external iliac arteriograms obtained with right lower  extremity runoff this was done in a stepped fashion. The straight catheter was then removed and a left femoral arteriogram obtained with left lower extremity runoff. Again this was done in a stepped fashion. I think ablation of the procedure. The patient was transferred to the holding area for removal of the sheath.  FINDINGS:  1. There are single renal arteries bilaterally with no significant renal artery stenosis identified. The infrarenal aorta, bilateral common iliac arteries, bilateral external iliac arteries, and bilateral hypogastric arteries are patent. 2. On the right side, which is the side of concern, the common femoral and deep femoral artery are patent. There is a flush occlusion of the right superficial femoral artery. There is reconstitution of the superficial femoral artery above the adductor canal. There are extensive collaterals suggesting that this is a chronic occlusion. The popliteal artery is patent. There is an abrupt occlusion of the proximal anterior tibial artery and peroneal artery proximally. The tibial peroneal trunk and posterior tibial artery are widely patent. There is reconstitution of the peroneal artery and anterior tibial artery distally which didn't fill retrograde. The dorsalis pedis on the right is occluded. 3. On the left side, the common femoral, deep femoral, superficial femoral, and popliteal arteries are patent. The anterior tibial, tibial peroneal trunk, peroneal arteries are widely patent. There is moderate diffuse disease throughout the left posterior tibial artery.  Waverly Ferrarihristopher Dickson, MD, FACS [facility]  DATE OF DICTATION:   07/28/2014

## 2014-07-28 NOTE — Progress Notes (Signed)
Sheath removed from Left Femoral Artery. Manual pressure held for 20 minutes. Hemostasis achieved. VSS. 4x4 and tegaderm applied to Left Femoral Site. Post procedure bleeding precautions reviewed with patient. Site Level 0. Patient states no pain at this time. Will continue to monitor

## 2014-07-28 NOTE — Progress Notes (Signed)
[facility],RN on 2W. Patient resting comfortably at this time. Patient states no pain. VSS. Lt femoral artery site WNL. Patient being transferred to 2W20 at this time.

## 2014-07-28 NOTE — Interval H&P Note (Signed)
History and Physical Interval Note:  07/28/2014 9:31 AM  Rickey Calderon  has presented today for surgery, with the diagnosis of pvd  The various methods of treatment have been discussed with the patient and family. After consideration of risks, benefits and other options for treatment, the patient has consented to  Procedure(s): ABDOMINAL AORTAGRAM (N/A) as a surgical intervention .  The patient's history has been reviewed, patient examined, no change in status, stable for surgery.  I have reviewed the patient's chart and labs.  Questions were answered to the patient's satisfaction.     Tayshaun Kroh S

## 2014-07-28 NOTE — Progress Notes (Signed)
Patient arrived to Cath Lab holding area. [facility]. Patient resting comfortably at this time. Patient states no pain. VSS. 15F sheath in Left Femoral Artery. Site WNL.

## 2014-07-28 NOTE — Progress Notes (Signed)
Patient transferred to Cath Lab via [facility]. Report given to Cath Lab and [facility]. Belongings packed and sent with patient. No concerns voiced at time of transfer.

## 2014-07-29 ENCOUNTER — Encounter (HOSPITAL_COMMUNITY): Payer: Self-pay | Admitting: Vascular Surgery

## 2014-07-29 DIAGNOSIS — I472 Ventricular tachycardia: Secondary | ICD-10-CM

## 2014-07-29 LAB — CBC
HEMATOCRIT: 39.2 % (ref 39.0–52.0)
Hemoglobin: 12.1 g/dL — ABNORMAL LOW (ref 13.0–17.0)
MCH: 27.6 pg (ref 26.0–34.0)
MCHC: 30.9 g/dL (ref 30.0–36.0)
MCV: 89.5 fL (ref 78.0–100.0)
Platelets: 252 10*3/uL (ref 150–400)
RBC: 4.38 MIL/uL (ref 4.22–5.81)
RDW: 14.7 % (ref 11.5–15.5)
WBC: 10.2 10*3/uL (ref 4.0–10.5)

## 2014-07-29 LAB — GLUCOSE, CAPILLARY: GLUCOSE-CAPILLARY: 89 mg/dL (ref 70–99)

## 2014-07-29 MED ORDER — PERFLUTREN LIPID MICROSPHERE
1.0000 mL | INTRAVENOUS | Status: AC | PRN
  Administered 2014-07-29: 3 mL via INTRAVENOUS
  Filled 2014-07-29: qty 10

## 2014-07-29 NOTE — Progress Notes (Signed)
  Echocardiogram 2D Echocardiogram has been performed.  Cathie BeamsGREGORY, Jacqlyn Marolf 07/29/2014, 2:44 PM

## 2014-07-29 NOTE — Progress Notes (Signed)
    Subjective  -   No complaints   Physical Exam:  Cannulation site is without complication Respirations nonlabored Abdomen soft       Assessment/Plan:    Embolic workup is in progress.  2-D echo has been completed but not finalized.  CT angiogram of the chest has been ordered but we'll delay for hydration given Carlyon ProwsAngie Graham yesterday.  After reviewing the images yesterday, I'm concerned that there may be acute thrombus.  The complicating issue is the patient's body habitus and risk of wound complications if a groin incision is performed.  After reviewing the images, I will consider attempting thrombectomy of the anterior tibial artery which would be an incision on the lateral side of the leg below the knee, to try to improve his blood flow.  If this is the ultimate plan, and will likely be on Thursday or Friday  BRABHAM IV, V. WELLS 07/29/2014 3:44 PM --  Filed Vitals:   07/29/14 1502  BP: 109/77  Pulse: 67  Temp: 98.4 F (36.9 C)  Resp: 20    Intake/Output Summary (Last 24 hours) at 07/29/14 1544 Last data filed at 07/29/14 1245  Gross per 24 hour  Intake    720 ml  Output      0 ml  Net    720 ml     Laboratory CBC    Component Value Date/Time   WBC 10.2 07/29/2014 0446   HGB 12.1* 07/29/2014 0446   HCT 39.2 07/29/2014 0446   PLT 252 07/29/2014 0446    BMET    Component Value Date/Time   NA 140 07/28/2014 0514   K 4.1 07/28/2014 0514   CL 101 07/28/2014 0514   CO2 32 07/28/2014 0514   GLUCOSE 113* 07/28/2014 0514   BUN 14 07/28/2014 0514   CREATININE 1.17 07/28/2014 1500   CALCIUM 8.8 07/28/2014 0514   GFRNONAA 72* 07/28/2014 1500   GFRAA 83* 07/28/2014 1500    COAG Lab Results  Component Value Date   INR 1.12 07/25/2014   No results found for: PTT  Antibiotics Anti-infectives    None       V. Charlena CrossWells Brabham IV, M.D. [facility] Office: [phone] Pager:  [phone]

## 2014-07-29 NOTE — Progress Notes (Signed)
[facility] PROGRESS NOTE  Rickey Calderon AVW:[id number] DOB: [birth year] DOA: 07/24/2014 PCP: No PCP Per Patient, recently moved to [place] from [place]   Brief narrative: 49 y/o obese male with OSA on CPAP,HTN, gout, and chr prostatitis admitted with rt groin pain and claudication. [facility] for Abdominal a[facility].   Assessment/Plan: Rt groin pain with claudication  placed on IV heparin on admission. vascular following underwent abdominal aortogram showing patent renal, bilateral common iliac, ext iliac and b/l hypogastric arteries. Flush occlusion of rt superficial femoral artery noted.  Occluded DP on right. Unclear if this is acute vs chronic.  Dr Myra GianottiBrabham will review the imaging from aortogram again today to see if there are fresh clots.. recommended 2D echo and CT angio of the chest. -dc IV heparin -prn pain control.  HTN Continue amlodipine and metoprolol.  Gout  continue allopurinol  OSA  continue bedtime CPAP  Obesity  counseled on weight loss and exercise.    DVT prophylaxis: sq lovenox  Diet: regular  Code Status: full Family Communication: none at bedside Disposition Plan: home once w/up completed   Consultants:  Vascular sx  Procedures:  Abdominal aortogram 2/1  CTangio abd and pelvis 1/29   MRI hip1/31  Antibiotics:  none  HPI/Subjective: Seen and examined . Reports mild rt groin pain and occasional claudication.   Objective: Filed Vitals:   07/29/14 0451  BP: 128/87  Pulse: 61  Temp: 97.6 F (36.4 C)  Resp: 20    Intake/Output Summary (Last 24 hours) at 07/29/14 1325 Last data filed at 07/28/14 1645  Gross per 24 hour  Intake    240 ml  Output      0 ml  Net    240 ml   Filed Weights   07/25/14 0117  Weight: 170 kg (374 lb 12.5 oz)    Exam:   General:  Middle aged obese male in NAD   HEENT: no pallor, moist mucosa, supple neck  Chest: clear b/l, no added sounds  CVS: NS1&S2, no  murmurs  GI: soft, NT, ND BS+  Ext: warm, no edema, absent rt Distal pulse  CNS: alert and oriented.     Data Reviewed: Basic Metabolic Panel:  Recent Labs Lab 07/24/14 2110 07/25/14 0313 07/28/14 0514 07/28/14 1500  NA 142 142 140  --   K 3.2* 3.7 4.1  --   CL 103 104 101  --   CO2 30 31 32  --   GLUCOSE 155* 107* 113*  --   BUN 19 19 14   --   CREATININE 1.19 1.08 1.12 1.17  CALCIUM 8.6 8.4 8.8  --    Liver Function Tests:  Recent Labs Lab 07/24/14 2110 07/25/14 0313  AST 22 17  ALT 21 21  ALKPHOS 70 68  BILITOT 0.6 0.5  PROT 7.2 6.5  ALBUMIN 3.5 3.2*    Recent Labs Lab 07/24/14 2110  LIPASE 27   No results for input(s): AMMONIA in the last 168 hours. CBC:  Recent Labs Lab 07/24/14 2110 07/25/14 0157 07/28/14 0514 07/28/14 1500 07/29/14 0446  WBC 11.9* 12.0* 9.8 8.0 10.2  NEUTROABS 9.0*  --   --   --   --   HGB 12.3* 11.6* 12.0* 12.0* 12.1*  HCT 39.9 38.5* 40.0 39.2 39.2  MCV 90.3 91.0 90.1 88.5 89.5  PLT 242 253 272 252 252   Cardiac Enzymes: No results for input(s): CKTOTAL, CKMB, CKMBINDEX, TROPONINI in the last 168 hours. BNP (  last 3 results) No results for input(s): BNP in the last 8760 hours.  ProBNP (last 3 results) No results for input(s): PROBNP in the last 8760 hours.  CBG:  Recent Labs Lab 07/25/14 0802 07/26/14 0750 07/27/14 0737 07/28/14 0745 07/29/14 0733  GLUCAP 101* 86 89 104* 89    Recent Results (from the past 240 hour(s))  Culture, blood (routine x 2)     Status: None (Preliminary result)   Collection Time: 07/25/14  1:19 AM  Result Value Ref Range Status   Specimen Description BLOOD RIGHT HAND  Final   Special Requests BOTTLES DRAWN AEROBIC ONLY 3CC  Final   Culture   Final           BLOOD CULTURE RECEIVED NO GROWTH TO DATE CULTURE WILL BE HELD FOR 5 DAYS BEFORE ISSUING A FINAL NEGATIVE REPORT Performed at [facility]    Report Status PENDING  Incomplete  Culture, blood (routine x 2)      Status: None (Preliminary result)   Collection Time: 07/25/14  1:54 AM  Result Value Ref Range Status   Specimen Description BLOOD RIGHT ARM  Final   Special Requests BOTTLES DRAWN AEROBIC ONLY 10CC  Final   Culture   Final           BLOOD CULTURE RECEIVED NO GROWTH TO DATE CULTURE WILL BE HELD FOR 5 DAYS BEFORE ISSUING A FINAL NEGATIVE REPORT Performed at [facility]    Report Status PENDING  Incomplete  Urine culture     Status: None   Collection Time: 07/25/14  8:25 AM  Result Value Ref Range Status   Specimen Description URINE, CLEAN CATCH  Final   Special Requests NONE  Final   Colony Count   Final    6,000 COLONIES/ML Performed at [facility]    Culture   Final    INSIGNIFICANT GROWTH Performed at [facility]    Report Status 07/26/2014 FINAL  Final     Studies: No results found.  Scheduled Meds: . allopurinol  300 mg Oral Daily  . amLODipine  10 mg Oral Daily  . aspirin  81 mg Oral Daily  . doxazosin  4 mg Oral Daily  . enoxaparin (LOVENOX) injection  40 mg Subcutaneous Q24H  . fluticasone  1 spray Each Nare Daily  . lisinopril  40 mg Oral Daily  . loratadine  10 mg Oral Daily  . metoprolol  100 mg Oral BID  . mometasone-formoterol  2 puff Inhalation BID  . multivitamin with minerals  1 tablet Oral Daily  . sertraline  200 mg Oral Daily   Continuous Infusions: . sodium chloride 10 mL/hr at 07/28/14 1191     Time spent: 25 minutes    Eddie North  [facility] Pager [phone] If 7PM-7AM, please contact night-coverage at [email or web address], password [facility] 07/29/2014, 1:25 PM  LOS: 5 days

## 2014-07-30 ENCOUNTER — Encounter (HOSPITAL_COMMUNITY): Payer: Self-pay | Admitting: Radiology

## 2014-07-30 ENCOUNTER — Inpatient Hospital Stay (HOSPITAL_COMMUNITY): Payer: Medicaid Other

## 2014-07-30 LAB — GLUCOSE, CAPILLARY: GLUCOSE-CAPILLARY: 111 mg/dL — AB (ref 70–99)

## 2014-07-30 LAB — CBC
HEMATOCRIT: 38.2 % — AB (ref 39.0–52.0)
HEMOGLOBIN: 11.8 g/dL — AB (ref 13.0–17.0)
MCH: 27.2 pg (ref 26.0–34.0)
MCHC: 30.9 g/dL (ref 30.0–36.0)
MCV: 88 fL (ref 78.0–100.0)
PLATELETS: 263 10*3/uL (ref 150–400)
RBC: 4.34 MIL/uL (ref 4.22–5.81)
RDW: 14.7 % (ref 11.5–15.5)
WBC: 8.9 10*3/uL (ref 4.0–10.5)

## 2014-07-30 MED ORDER — DEXTROSE 5 % IV SOLN
3.0000 g | INTRAVENOUS | Status: DC
  Filled 2014-07-30: qty 3000

## 2014-07-30 MED ORDER — IOHEXOL 350 MG/ML SOLN
100.0000 mL | Freq: Once | INTRAVENOUS | Status: AC | PRN
  Administered 2014-07-30: 100 mL via INTRAVENOUS

## 2014-07-30 MED ORDER — DEXTROSE 5 % IV SOLN
3.0000 g | INTRAVENOUS | Status: AC
  Administered 2014-08-01: 3 g via INTRAVENOUS
  Filled 2014-07-30: qty 3000

## 2014-07-30 MED ORDER — CEFAZOLIN SODIUM 1-5 GM-% IV SOLN
1.0000 g | INTRAVENOUS | Status: DC
  Filled 2014-07-30: qty 50

## 2014-07-30 NOTE — Progress Notes (Signed)
Considering right leg thrombectomy on Friday.  Will discuss with patient on Thursday.  CTA of chest was PE protocol.  Aorta not well visualized, ut no obvious source of thrombus.  Echo normal.  Durene CalWells Camrin Lapre

## 2014-07-30 NOTE — Progress Notes (Signed)
[facility] PROGRESS NOTE  Howell Groesbeck ZOX:[id number] DOB: [birth year] DOA: 07/24/2014 PCP: No PCP Per Patient, recently moved to [place] from    Brief narrative: 49 y/o obese male with OSA on CPAP,HTN, gout, and chr prostatitis admitted with rt groin pain and claudication. Pt transferred from [facility] to [facility] on 2/1 for Abdominal aortogram.   Assessment/Plan: Rt groin pain with claudication  - placed on IV heparin on admission. vascular following underwent abdominal aortogram showing patent renal, bilateral common iliac, ext iliac and b/l hypogastric arteries.  occlusion of rt superficial femoral artery noted.  -Occluded DP on right. Unclear if this is acute vs chronic. - 2-D echo showing EF 60%, CT chest was PE protocol, so I will start was not well visualized, but no obvious source of thrombus. -off IV heparin - for right leg thrombectomy on Friday -prn pain control.  HTN Continue amlodipine and metoprolol.  Gout  continue allopurinol  OSA  continue bedtime CPAP  Obesity  counseled on weight loss and exercise.    DVT prophylaxis: sq lovenox  Diet: regular  Code Status: full Family Communication: none at bedside Disposition Plan: home once w/up completed   Consultants:  Vascular sx  Procedures:  Abdominal aortogram 2/1  CTangio abd and pelvis 1/29   MRI hip1/31  Antibiotics:  none  HPI/Subjective: Seen and examined . Reports mild rt groin pain and occasional claudication.   Objective: Filed Vitals:   07/30/14 1355  BP: 122/85  Pulse: 62  Temp: 97.8 F (36.6 C)  Resp: 16    Intake/Output Summary (Last 24 hours) at 07/30/14 1421 Last data filed at 07/30/14 1300  Gross per 24 hour  Intake    720 ml  Output      0 ml  Net    720 ml   Filed Weights   07/25/14 0117  Weight: 170 kg (374 lb 12.5 oz)    Exam:   General:  Middle aged obese male in NAD   HEENT: no pallor, moist mucosa, supple neck  Chest: clear b/l, no added  sounds  CVS: NS1&S2, no murmurs  GI: soft, NT, ND BS+  Ext: warm, no edema, absent rt Distal pulse  CNS: alert and oriented.     Data Reviewed: Basic Metabolic Panel:  Recent Labs Lab 07/24/14 2110 07/25/14 0313 07/28/14 0514 07/28/14 1500  NA 142 142 140  --   K 3.2* 3.7 4.1  --   CL 103 104 101  --   CO2 30 31 32  --   GLUCOSE 155* 107* 113*  --   BUN --   CREATININE 1.19 1.08 1.12 1.17  CALCIUM 8.6 8.4 8.8  --    Liver Function Tests:  Recent Labs Lab 07/24/14 2110 07/25/14 0313  AST 22 17  ALT 21 21  ALKPHOS 70 68  BILITOT 0.6 0.5  PROT 7.2 6.5  ALBUMIN 3.5 3.2*    Recent Labs Lab 07/24/14 2110  LIPASE 27   No results for input(s): AMMONIA in the last 168 hours. CBC:  Recent Labs Lab 07/24/14 2110 07/25/14 0157 07/28/14 0514 07/28/14 1500 07/29/14 0446 07/30/14 0310  WBC 11.9* 12.0* 9.8 8.0 10.2 8.9  NEUTROABS 9.0*  --   --   --   --   --   HGB 12.3* 11.6* 12.0* 12.0* 12.1* 11.8*  HCT 39.9 38.5* 40.0 39.2 39.2 38.2*  MCV 90.3 91.0 90.1 88.5 89.5 88.0  PLT 242 253 272 252 252 263  Cardiac Enzymes: No results for input(s): CKTOTAL, CKMB, CKMBINDEX, TROPONINI in the last 168 hours. BNP (last 3 results) No results for input(s): BNP in the last 8760 hours.  ProBNP (last 3 results) No results for input(s): PROBNP in the last 8760 hours.  CBG:  Recent Labs Lab 07/26/14 0750 07/27/14 0737 07/28/14 0745 07/29/14 0733 07/30/14 0548  GLUCAP 86 89 104* 89 111*    Recent Results (from the past 240 hour(s))  Culture, blood (routine x 2)     Status: None (Preliminary result)   Collection Time: 07/25/14  1:19 AM  Result Value Ref Range Status   Specimen Description BLOOD RIGHT HAND  Final   Special Requests BOTTLES DRAWN AEROBIC ONLY 3CC  Final   Culture   Final           BLOOD CULTURE RECEIVED NO GROWTH TO DATE CULTURE WILL BE HELD FOR 5 DAYS BEFORE ISSUING A FINAL NEGATIVE REPORT Performed at [facility]     Report Status PENDING  Incomplete  Culture, blood (routine x 2)     Status: None (Preliminary result)   Collection Time: 07/25/14  1:54 AM  Result Value Ref Range Status   Specimen Description BLOOD RIGHT ARM  Final   Special Requests BOTTLES DRAWN AEROBIC ONLY 10CC  Final   Culture   Final           BLOOD CULTURE RECEIVED NO GROWTH TO DATE CULTURE WILL BE HELD FOR 5 DAYS BEFORE ISSUING A FINAL NEGATIVE REPORT Performed at [facility]    Report Status PENDING  Incomplete  Urine culture     Status: None   Collection Time: 07/25/14  8:25 AM  Result Value Ref Range Status   Specimen Description URINE, CLEAN CATCH  Final   Special Requests NONE  Final   Colony Count   Final    6,000 COLONIES/ML Performed at [facility]    Culture   Final    INSIGNIFICANT GROWTH Performed at [facility]    Report Status 07/26/2014 FINAL  Final     Studies: Ct Angio Chest Pe W/cm &/or Wo Cm  07/30/2014   CLINICAL DATA:  Shortness of breath with exertion. Groin pain. Thrombus of pulmonary vein.  EXAM: CT ANGIOGRAPHY CHEST WITH CONTRAST  TECHNIQUE: Multidetector CT imaging of the chest was performed using the standard protocol during bolus administration of intravenous contrast. Multiplanar CT image reconstructions and MIPs were obtained to evaluate the vascular anatomy.  CONTRAST:  100mL OMNIPAQUE IOHEXOL 350 MG/ML SOLN  COMPARISON:  None.  FINDINGS: Significant patient motion artifact, suboptimal contrast bolus timing, and soft tissue attenuation from body habitus limits evaluation. There are no filling defects within the main or lobar branches. The subsegmental branches are not well assessed.  There is multi chamber cardiomegaly. There is likely mild aneurysmal dilatation of the mid ascending aorta, measuring 3.8- 4.0 cm, however accurate caliber placement is difficult due to motion through this region. There is no pleural or pericardial effusion. There is no mediastinal or hilar  adenopathy.  Right and left lower lobe linear atelectasis. No consolidation to suggest pneumonia.  No acute abnormality in the included upper abdomen. There is degenerative change in the mid lower thoracic spine.  Review of the MIP images confirms the above findings.  IMPRESSION: 1. Limited assessment demonstrates no central pulmonary embolus. Please note the segmental in subsegmental branches are are not well assessed. 2. Cardiomegaly. Suspect mild aneurysmal dilatation of the ascending aorta, up to 4  cm. Recommend annual imaging followup by CTA or MRA. This recommendation follows 2010 ACCF/AHA/AATS/ACR/ASA/SCA/SCAI/SIR/STS/SVM Guidelines for the Diagnosis and Management of Patients with Thoracic Aortic Disease. Circulation. 2010; 121: Z610-R604   Electronically Signed   By: Rubye Oaks M.D.   On: 07/30/2014 01:45    Scheduled Meds: . allopurinol  300 mg Oral Daily  . amLODipine  10 mg Oral Daily  . aspirin  81 mg Oral Daily  . [START ON 07/31/2014]  ceFAZolin (ANCEF) IV  1 g Intravenous On Call  . doxazosin  4 mg Oral Daily  . enoxaparin (LOVENOX) injection  40 mg Subcutaneous Q24H  . fluticasone  1 spray Each Nare Daily  . lisinopril  40 mg Oral Daily  . loratadine  10 mg Oral Daily  . metoprolol  100 mg Oral BID  . mometasone-formoterol  2 puff Inhalation BID  . multivitamin with minerals  1 tablet Oral Daily  . sertraline  200 mg Oral Daily   Continuous Infusions: . sodium chloride 10 mL/hr at 07/28/14 5409     Time spent: 25 minutes    [facility], [place]  [facility] Pager [phone] If 7PM-7AM, please contact night-coverage at [email or web address], password [facility] 07/30/2014, 2:21 PM  LOS: 6 days

## 2014-07-31 DIAGNOSIS — R1030 Lower abdominal pain, unspecified: Secondary | ICD-10-CM

## 2014-07-31 LAB — CBC
HCT: 37.9 % — ABNORMAL LOW (ref 39.0–52.0)
Hemoglobin: 11.7 g/dL — ABNORMAL LOW (ref 13.0–17.0)
MCH: 27.1 pg (ref 26.0–34.0)
MCHC: 30.9 g/dL (ref 30.0–36.0)
MCV: 87.7 fL (ref 78.0–100.0)
Platelets: 261 10*3/uL (ref 150–400)
RBC: 4.32 MIL/uL (ref 4.22–5.81)
RDW: 14.7 % (ref 11.5–15.5)
WBC: 10.5 10*3/uL (ref 4.0–10.5)

## 2014-07-31 LAB — BASIC METABOLIC PANEL
Anion gap: 7 (ref 5–15)
BUN: 15 mg/dL (ref 6–23)
CHLORIDE: 100 mmol/L (ref 96–112)
CO2: 31 mmol/L (ref 19–32)
Calcium: 8.8 mg/dL (ref 8.4–10.5)
Creatinine, Ser: 1.23 mg/dL (ref 0.50–1.35)
GFR calc non Af Amer: 67 mL/min — ABNORMAL LOW (ref >90)
GFR, EST AFRICAN AMERICAN: 78 mL/min — AB (ref >90)
Glucose, Bld: 100 mg/dL — ABNORMAL HIGH (ref 70–99)
POTASSIUM: 4 mmol/L (ref 3.5–5.1)
Sodium: 138 mmol/L (ref 135–145)

## 2014-07-31 LAB — PROTIME-INR
INR: 1.11 (ref 0.00–1.49)
Prothrombin Time: 14.4 seconds (ref 11.6–15.2)

## 2014-07-31 LAB — CULTURE, BLOOD (ROUTINE X 2)
Culture: NO GROWTH
Culture: NO GROWTH

## 2014-07-31 LAB — GLUCOSE, CAPILLARY: GLUCOSE-CAPILLARY: 100 mg/dL — AB (ref 70–99)

## 2014-07-31 LAB — MRSA PCR SCREENING: MRSA by PCR: NEGATIVE

## 2014-07-31 MED ORDER — ENOXAPARIN SODIUM 40 MG/0.4ML ~~LOC~~ SOLN: 40.0000 mg | SUBCUTANEOUS | Status: DC

## 2014-07-31 NOTE — Progress Notes (Signed)
Pt. States he can place cpap on himself. RT informed pt. To notify if he needed any assistance. 

## 2014-07-31 NOTE — Progress Notes (Signed)
Subjective  -   No acute events overnight. Denies ischemic rest pain in his right leg.  He has been able to walk.  He continues to have right groin pain    Physical Exam:  Perfused right lower extremity. Right groin tenderness Abdomen soft      Assessment/Plan:   I had a lengthy discussion with the patient regarding the blood flow to his right leg.  Some aspects of his workup suggests that this is a acute process.  Angiography showed multiple collaterals which would be suggestive of a more chronic process.  I discussed with the patient that our best attempt to restore blood flow to his leg would be to attempt a femoral embolectomy.  Because of his large pannus, I would like to avoid making a groin incision.  I suspect that I can make a above-knee popliteal artery incision and performed embolectomy via a retrograde approach.  In addition the patient has what looks like acute thrombus in his right anterior tibial artery.  I should be able to get this out from an above-knee incision.  I did discuss that when I get into the operating room that the clot may appear chronic.  At this the case I would likely not be able to improve blood flow to his right leg.  If it is an acute process he will likely need anticoagulation.  This is been scheduled for Friday around lunchtime.  Ferrah Panagopoulos IV, V. WELLS 07/31/2014 10:20 AM --  Filed Vitals:   07/31/14 0408  BP: 125/84  Pulse: 58  Temp: 97.9 F (36.6 C)  Resp: 16    Intake/Output Summary (Last 24 hours) at 07/31/14 1020 Last data filed at 07/31/14 0759  Gross per 24 hour  Intake    240 ml  Output      0 ml  Net    240 ml     Laboratory CBC    Component Value Date/Time   WBC 10.5 07/31/2014 0421   HGB 11.7* 07/31/2014 0421   HCT 37.9* 07/31/2014 0421   PLT 261 07/31/2014 0421    BMET    Component Value Date/Time   NA 138 07/31/2014 0421   K 4.0 07/31/2014 0421   CL 100 07/31/2014 0421   CO2 31 07/31/2014 0421   GLUCOSE  100* 07/31/2014 0421   BUN 15 07/31/2014 0421   CREATININE 1.23 07/31/2014 0421   CALCIUM 8.8 07/31/2014 0421   GFRNONAA 67* 07/31/2014 0421   GFRAA 78* 07/31/2014 0421    COAG Lab Results  Component Value Date   INR 1.11 07/31/2014   INR 1.12 07/25/2014   No results found for: PTT  Antibiotics Anti-infectives    Start     Dose/Rate Route Frequency Ordered Stop   08/01/14 0600  ceFAZolin (ANCEF) 3 g in dextrose 5 % 50 mL IVPB    Comments:  Send with pt to OR   3 g160 mL/hr over 30 Minutes Intravenous On call 07/30/14 1503 08/02/14 0600   07/31/14 0600  ceFAZolin (ANCEF) IVPB 1 g/50 mL premix  Status:  Discontinued    Comments:  Send with pt to OR   1 g100 mL/hr over 30 Minutes Intravenous On call 07/30/14 1244 07/30/14 1435   07/31/14 0600  ceFAZolin (ANCEF) 3 g in dextrose 5 % 50 mL IVPB  Status:  Discontinued    Comments:  Send with pt to OR   3 g160 mL/hr over 30 Minutes Intravenous On call 07/30/14 1435 07/30/14 1503  Eldridge Abrahams, M.D. [facility] Office: [phone] Pager:  [phone]

## 2014-07-31 NOTE — Progress Notes (Signed)
[facility] PROGRESS NOTE  Rickey Calderon ZOX:[id number] DOB: 19-Oct-1965 DOA: 07/24/2014 PCP: No PCP Per Patient, recently moved to [place] from    Brief narrative: 49 y/o obese male with OSA on CPAP,HTN, gout, and chr prostatitis admitted with rt groin pain and claudication. [facility] for Abdominal a[facility]ogram.   Assessment/Plan: Rt groin pain with claudication  - placed on IV heparin on admission. vascular following underwent abdominal aortogram showing patent renal, bilateral common iliac, ext iliac and b/l hypogastric arteries.  occlusion of rt superficial femoral artery noted.  -Occluded DP on right. Unclear if this is acute vs chronic. - 2-D echo showing EF 60%, CT chest was PE protocol, though was not well visualized, but no obvious source of thrombus. -off IV heparin - for right leg thrombectomy/embolectomy on Friday -prn pain control.  HTN Continue amlodipine and metoprolol.  Gout  continue allopurinol  OSA  continue bedtime CPAP  Obesity  counseled on weight loss and exercise.    DVT prophylaxis: sq lovenox  Diet: regular  Code Status: full Family Communication: none at bedside Disposition Plan: home once w/up completed   Consultants:  Vascular sx  Procedures:  Abdominal aortogram 2/1  CTangio abd and pelvis 1/29   MRI hip1/31  Antibiotics:  none  HPI/Subjective: Seen and examined . Reports mild rt groin pain and occasional claudication.   Objective: Filed Vitals:   07/31/14 1417  BP: 129/80  Pulse: 61  Temp: 97.7 F (36.5 C)  Resp: 18    Intake/Output Summary (Last 24 hours) at 07/31/14 1420 Last data filed at 07/31/14 1417  Gross per 24 hour  Intake    480 ml  Output      0 ml  Net    480 ml   Filed Weights   07/25/14 0117  Weight: 170 kg (374 lb 12.5 oz)    Exam:   General:  Middle aged obese male in NAD   HEENT: no pallor, moist mucosa, supple neck  Chest: clear b/l, no added  sounds  CVS: NS1&S2, no murmurs  GI: soft, NT, ND BS+  Ext: warm, no edema, absent rt Distal pulse  CNS: alert and oriented.     Data Reviewed: Basic Metabolic Panel:  Recent Labs Lab 07/24/14 2110 07/25/14 0313 07/28/14 0514 07/28/14 1500 07/31/14 0421  NA 142 142 140  --  138  K 3.2* 3.7 4.1  --  4.0  CL 103 104 101  --  100  CO2 30 31 32  --  31  GLUCOSE 155* 107* 113*  --  100*  BUN 19 19 14   --  15  CREATININE 1.19 1.08 1.12 1.17 1.23  CALCIUM 8.6 8.4 8.8  --  8.8   Liver Function Tests:  Recent Labs Lab 07/24/14 2110 07/25/14 0313  AST 22 17  ALT 21 21  ALKPHOS 70 68  BILITOT 0.6 0.5  PROT 7.2 6.5  ALBUMIN 3.5 3.2*    Recent Labs Lab 07/24/14 2110  LIPASE 27   No results for input(s): AMMONIA in the last 168 hours. CBC:  Recent Labs Lab 07/24/14 2110  07/28/14 0514 07/28/14 1500 07/29/14 0446 07/30/14 0310 07/31/14 0421  WBC 11.9*  < > 9.8 8.0 10.2 8.9 10.5  NEUTROABS 9.0*  --   --   --   --   --   --   HGB 12.3*  < > 12.0* 12.0* 12.1* 11.8* 11.7*  HCT 39.9  < > 40.0 39.2 39.2  38.2* 37.9*  MCV 90.3  < > 90.1 88.5 89.5 88.0 87.7  PLT 242  < > 272 252 252 263 261  < > = values in this interval not displayed. Cardiac Enzymes: No results for input(s): CKTOTAL, CKMB, CKMBINDEX, TROPONINI in the last 168 hours. BNP (last 3 results) No results for input(s): BNP in the last 8760 hours.  ProBNP (last 3 results) No results for input(s): PROBNP in the last 8760 hours.  CBG:  Recent Labs Lab 07/27/14 0737 07/28/14 0745 07/29/14 0733 07/30/14 0548 07/31/14 0613  GLUCAP 89 104* 89 111* 100*    Recent Results (from the past 240 hour(s))  Culture, blood (routine x 2)     Status: None   Collection Time: 07/25/14  1:19 AM  Result Value Ref Range Status   Specimen Description BLOOD RIGHT HAND  Final   Special Requests BOTTLES DRAWN AEROBIC ONLY 3CC  Final   Culture   Final    NO GROWTH 5 DAYS Performed at [facility]     Report Status 07/31/2014 FINAL  Final  Culture, blood (routine x 2)     Status: None   Collection Time: 07/25/14  1:54 AM  Result Value Ref Range Status   Specimen Description BLOOD RIGHT ARM  Final   Special Requests BOTTLES DRAWN AEROBIC ONLY 10CC  Final   Culture   Final    NO GROWTH 5 DAYS Performed at [facility]    Report Status 07/31/2014 FINAL  Final  Urine culture     Status: None   Collection Time: 07/25/14  8:25 AM  Result Value Ref Range Status   Specimen Description URINE, CLEAN CATCH  Final   Special Requests NONE  Final   Colony Count   Final    6,000 COLONIES/ML Performed at [facility]    Culture   Final    INSIGNIFICANT GROWTH Performed at [facility]    Report Status 07/26/2014 FINAL  Final     Studies: Ct Angio Chest Pe W/cm &/or Wo Cm  07/30/2014   CLINICAL DATA:  Shortness of breath with exertion. Groin pain. Thrombus of pulmonary vein.  EXAM: CT ANGIOGRAPHY CHEST WITH CONTRAST  TECHNIQUE: Multidetector CT imaging of the chest was performed using the standard protocol during bolus administration of intravenous contrast. Multiplanar CT image reconstructions and MIPs were obtained to evaluate the vascular anatomy.  CONTRAST:  OMNIPAQUE IOHEXOL 350 MG/ML SOLN  COMPARISON:  None.  FINDINGS: Significant patient motion artifact, suboptimal contrast bolus timing, and soft tissue attenuation from body habitus limits evaluation. There are no filling defects within the main or lobar branches. The subsegmental branches are not well assessed.  There is multi chamber cardiomegaly. There is likely mild aneurysmal dilatation of the mid ascending aorta, measuring 3.8- 4.0 cm, however accurate caliber placement is difficult due to motion through this region. There is no pleural or pericardial effusion. There is no mediastinal or hilar adenopathy.  Right and left lower lobe linear atelectasis. No consolidation to suggest pneumonia.  No acute  abnormality in the included upper abdomen. There is degenerative change in the mid lower thoracic spine.  Review of the MIP images confirms the above findings.  IMPRESSION: 1. Limited assessment demonstrates no central pulmonary embolus. Please note the segmental in subsegmental branches are are not well assessed. 2. Cardiomegaly. Suspect mild aneurysmal dilatation of the ascending aorta, up to 4 cm. Recommend annual imaging followup by CTA or MRA. This recommendation follows 2010 ACCF/AHA/AATS/ACR/ASA/SCA/SCAI/SIR/STS/SVM  Guidelines for the Diagnosis and Management of Patients with Thoracic Aortic Disease. Circulation. 2010; 121: Z610-R604   Electronically Signed   By: Rubye Oaks M.D.   On: 07/30/2014 01:45    Scheduled Meds: . allopurinol  300 mg Oral Daily  . amLODipine  10 mg Oral Daily  . aspirin  81 mg Oral Daily  . [START ON 08/01/2014]  ceFAZolin (ANCEF) IV  3 g Intravenous On Call  . doxazosin  4 mg Oral Daily  . enoxaparin (LOVENOX) injection  40 mg Subcutaneous Q24H  . fluticasone  1 spray Each Nare Daily  . lisinopril  40 mg Oral Daily  . loratadine  10 mg Oral Daily  . metoprolol  100 mg Oral BID  . mometasone-formoterol  2 puff Inhalation BID  . multivitamin with minerals  1 tablet Oral Daily  . sertraline  200 mg Oral Daily   Continuous Infusions: . sodium chloride 10 mL/hr at 07/28/14 5409     Time spent: 25 minutes    [facility], Aziah Brostrom  [facility] Pager [phone] If 7PM-7AM, please contact night-coverage at [email or web address], password [facility] 07/31/2014, 2:20 PM  LOS: 7 days

## 2014-08-01 ENCOUNTER — Inpatient Hospital Stay (HOSPITAL_COMMUNITY): Payer: Medicaid Other | Admitting: Certified Registered Nurse Anesthetist

## 2014-08-01 ENCOUNTER — Encounter (HOSPITAL_COMMUNITY)
Admission: EM | Disposition: A | Discharge status: Home / Self Care | Payer: Self-pay | Source: Home / Self Care | Attending: Internal Medicine

## 2014-08-01 ENCOUNTER — Encounter (HOSPITAL_COMMUNITY): Payer: Self-pay | Admitting: Certified Registered Nurse Anesthetist

## 2014-08-01 DIAGNOSIS — I82431 Acute embolism and thrombosis of right popliteal vein: Secondary | ICD-10-CM

## 2014-08-01 DIAGNOSIS — I82491 Acute embolism and thrombosis of other specified deep vein of right lower extremity: Secondary | ICD-10-CM

## 2014-08-01 DIAGNOSIS — I82411 Acute embolism and thrombosis of right femoral vein: Secondary | ICD-10-CM

## 2014-08-01 HISTORY — PX: THROMBECTOMY FEMORAL ARTERY: SHX6406

## 2014-08-01 LAB — BASIC METABOLIC PANEL
Anion gap: 9 (ref 5–15)
BUN: 19 mg/dL (ref 6–23)
CO2: 29 mmol/L (ref 19–32)
CREATININE: 1.25 mg/dL (ref 0.50–1.35)
Calcium: 8.8 mg/dL (ref 8.4–10.5)
Chloride: 102 mmol/L (ref 96–112)
GFR calc Af Amer: 77 mL/min — ABNORMAL LOW (ref >90)
GFR, EST NON AFRICAN AMERICAN: 66 mL/min — AB (ref >90)
Glucose, Bld: 95 mg/dL (ref 70–99)
Potassium: 4.2 mmol/L (ref 3.5–5.1)
SODIUM: 140 mmol/L (ref 135–145)

## 2014-08-01 LAB — CBC
HCT: 37.5 % — ABNORMAL LOW (ref 39.0–52.0)
Hemoglobin: 11.6 g/dL — ABNORMAL LOW (ref 13.0–17.0)
MCH: 27.4 pg (ref 26.0–34.0)
MCHC: 30.9 g/dL (ref 30.0–36.0)
MCV: 88.4 fL (ref 78.0–100.0)
Platelets: 260 10*3/uL (ref 150–400)
RBC: 4.24 MIL/uL (ref 4.22–5.81)
RDW: 14.9 % (ref 11.5–15.5)
WBC: 9.7 10*3/uL (ref 4.0–10.5)

## 2014-08-01 LAB — GLUCOSE, CAPILLARY: Glucose-Capillary: 124 mg/dL — ABNORMAL HIGH (ref 70–99)

## 2014-08-01 LAB — POCT ACTIVATED CLOTTING TIME
ACTIVATED CLOTTING TIME: 146 s
Activated Clotting Time: 140 seconds

## 2014-08-01 SURGERY — THROMBECTOMY, ARTERY, FEMORAL
Anesthesia: General | Site: Leg Lower | Laterality: Right

## 2014-08-01 MED ORDER — LACTATED RINGERS IV SOLN
INTRAVENOUS | Status: DC | PRN
  Administered 2014-08-01: 12:00:00 via INTRAVENOUS

## 2014-08-01 MED ORDER — WARFARIN VIDEO
Freq: Once | Status: AC
  Administered 2014-08-02: 15:00:00

## 2014-08-01 MED ORDER — SUCCINYLCHOLINE CHLORIDE 20 MG/ML IJ SOLN
INTRAMUSCULAR | Status: DC | PRN
  Administered 2014-08-01: 100 mg via INTRAVENOUS

## 2014-08-01 MED ORDER — LABETALOL HCL 5 MG/ML IV SOLN
10.0000 mg | INTRAVENOUS | Status: DC | PRN
  Filled 2014-08-01: qty 4

## 2014-08-01 MED ORDER — OXYCODONE HCL 5 MG PO TABS
5.0000 mg | ORAL_TABLET | ORAL | Status: DC | PRN
  Administered 2014-08-01 – 2014-08-02 (×3): 5 mg via ORAL
  Administered 2014-08-02 – 2014-08-05 (×6): 10 mg via ORAL
  Filled 2014-08-01 (×2): qty 2
  Filled 2014-08-01 (×6): qty 1
  Filled 2014-08-01 (×2): qty 2
  Filled 2014-08-01: qty 1

## 2014-08-01 MED ORDER — HEPARIN (PORCINE) IN NACL 100-0.45 UNIT/ML-% IJ SOLN
1900.0000 [IU]/h | INTRAMUSCULAR | Status: DC
  Administered 2014-08-01 – 2014-08-02 (×3): 1900 [IU]/h via INTRAVENOUS
  Filled 2014-08-01 (×7): qty 250

## 2014-08-01 MED ORDER — FENTANYL CITRATE 0.05 MG/ML IJ SOLN
25.0000 ug | INTRAMUSCULAR | Status: DC | PRN
  Administered 2014-08-01: 50 ug via INTRAVENOUS

## 2014-08-01 MED ORDER — WARFARIN SODIUM 10 MG PO TABS
10.0000 mg | ORAL_TABLET | Freq: Once | ORAL | Status: AC
  Administered 2014-08-01: 10 mg via ORAL
  Filled 2014-08-01: qty 1

## 2014-08-01 MED ORDER — METOPROLOL TARTRATE 1 MG/ML IV SOLN
2.0000 mg | INTRAVENOUS | Status: DC | PRN
Start: 2014-08-01 — End: 2014-08-05

## 2014-08-01 MED ORDER — DOCUSATE SODIUM 100 MG PO CAPS
100.0000 mg | ORAL_CAPSULE | Freq: Every day | ORAL | Status: DC
  Administered 2014-08-02 – 2014-08-05 (×4): 100 mg via ORAL
  Filled 2014-08-01 (×5): qty 1

## 2014-08-01 MED ORDER — OXYCODONE HCL 5 MG/5ML PO SOLN: 5.0000 mg | Freq: Once | ORAL | Status: DC | PRN

## 2014-08-01 MED ORDER — FENTANYL CITRATE 0.05 MG/ML IJ SOLN
INTRAMUSCULAR | Status: AC
  Filled 2014-08-01: qty 5

## 2014-08-01 MED ORDER — GLUCAGON HCL RDNA (DIAGNOSTIC) 1 MG IJ SOLR
INTRAMUSCULAR | Status: DC | PRN
  Administered 2014-08-01: 1 mg via INTRAVENOUS
  Administered 2014-08-01: 3 mg via INTRAVENOUS
  Administered 2014-08-01: 1 mg via INTRAVENOUS

## 2014-08-01 MED ORDER — MIDAZOLAM HCL 2 MG/2ML IJ SOLN
INTRAMUSCULAR | Status: AC
  Filled 2014-08-01: qty 2

## 2014-08-01 MED ORDER — ROCURONIUM BROMIDE 100 MG/10ML IV SOLN: INTRAVENOUS | Status: DC | PRN

## 2014-08-01 MED ORDER — NEOSTIGMINE METHYLSULFATE 10 MG/10ML IV SOLN
INTRAVENOUS | Status: DC | PRN
  Administered 2014-08-01: 3 mg via INTRAVENOUS

## 2014-08-01 MED ORDER — PHENOL 1.4 % MT LIQD: 1.0000 | OROMUCOSAL | Status: DC | PRN

## 2014-08-01 MED ORDER — ROCURONIUM BROMIDE 100 MG/10ML IV SOLN
INTRAVENOUS | Status: DC | PRN
  Administered 2014-08-01: 20 mg via INTRAVENOUS
  Administered 2014-08-01: 30 mg via INTRAVENOUS

## 2014-08-01 MED ORDER — ONDANSETRON HCL 4 MG/2ML IJ SOLN
INTRAMUSCULAR | Status: DC | PRN
  Administered 2014-08-01: 4 mg via INTRAVENOUS

## 2014-08-01 MED ORDER — MAGNESIUM SULFATE 2 GM/50ML IV SOLN
2.0000 g | Freq: Every day | INTRAVENOUS | Status: DC | PRN
  Filled 2014-08-01: qty 50

## 2014-08-01 MED ORDER — PROPOFOL 10 MG/ML IV BOLUS
INTRAVENOUS | Status: DC | PRN
Start: 2014-08-01 — End: 2014-08-01
  Administered 2014-08-01: 40 mg via INTRAVENOUS
  Administered 2014-08-01: 110 mg via INTRAVENOUS
  Administered 2014-08-01: 40 mg via INTRAVENOUS

## 2014-08-01 MED ORDER — POTASSIUM CHLORIDE CRYS ER 20 MEQ PO TBCR: 20.0000 meq | EXTENDED_RELEASE_TABLET | Freq: Every day | ORAL | Status: DC | PRN

## 2014-08-01 MED ORDER — ATROPINE SULFATE 0.4 MG/ML IJ SOLN
INTRAMUSCULAR | Status: DC | PRN
  Administered 2014-08-01: .3 mg via INTRAVENOUS
  Administered 2014-08-01 (×2): .5 mg via INTRAVENOUS
  Administered 2014-08-01: 0.2 mg via INTRAVENOUS

## 2014-08-01 MED ORDER — GLYCOPYRROLATE 0.2 MG/ML IJ SOLN
INTRAMUSCULAR | Status: DC | PRN
  Administered 2014-08-01: 0.6 mg via INTRAVENOUS
  Administered 2014-08-01 (×3): 0.2 mg via INTRAVENOUS

## 2014-08-01 MED ORDER — MIDAZOLAM HCL 5 MG/5ML IJ SOLN
INTRAMUSCULAR | Status: DC | PRN
  Administered 2014-08-01: 2 mg via INTRAVENOUS

## 2014-08-01 MED ORDER — ALUM & MAG HYDROXIDE-SIMETH 200-200-20 MG/5ML PO SUSP: 15.0000 mL | ORAL | Status: DC | PRN

## 2014-08-01 MED ORDER — GLUCAGON HCL RDNA (DIAGNOSTIC) 1 MG IJ SOLR
INTRAMUSCULAR | Status: AC
  Filled 2014-08-01: qty 1

## 2014-08-01 MED ORDER — COUMADIN BOOK
Freq: Once | Status: AC
  Administered 2014-08-02: 15:00:00
  Filled 2014-08-01 (×2): qty 1

## 2014-08-01 MED ORDER — HEPARIN SODIUM (PORCINE) 5000 UNIT/ML IJ SOLN
INTRAMUSCULAR | Status: DC | PRN
  Administered 2014-08-01: 500 mL

## 2014-08-01 MED ORDER — IOHEXOL 300 MG/ML  SOLN
INTRAMUSCULAR | Status: DC | PRN
  Administered 2014-08-01: 50 mL via INTRAVENOUS
  Administered 2014-08-01: 10 mL via INTRAVENOUS

## 2014-08-01 MED ORDER — PANTOPRAZOLE SODIUM 40 MG PO TBEC
40.0000 mg | DELAYED_RELEASE_TABLET | Freq: Every day | ORAL | Status: DC
  Administered 2014-08-01 – 2014-08-05 (×5): 40 mg via ORAL
  Filled 2014-08-01 (×5): qty 1

## 2014-08-01 MED ORDER — OXYCODONE HCL 5 MG PO TABS: 5.0000 mg | ORAL_TABLET | Freq: Once | ORAL | Status: DC | PRN

## 2014-08-01 MED ORDER — WARFARIN - PHARMACIST DOSING INPATIENT
Freq: Every day | Status: DC
  Administered 2014-08-04: 18:00:00

## 2014-08-01 MED ORDER — LIDOCAINE HCL (CARDIAC) 20 MG/ML IV SOLN
INTRAVENOUS | Status: DC | PRN
  Administered 2014-08-01: 50 mg via INTRAVENOUS
  Administered 2014-08-01: 50 mg via INTRATRACHEAL

## 2014-08-01 MED ORDER — FENTANYL CITRATE 0.05 MG/ML IJ SOLN
INTRAMUSCULAR | Status: DC | PRN
  Administered 2014-08-01: 50 ug via INTRAVENOUS
  Administered 2014-08-01: 100 ug via INTRAVENOUS
  Administered 2014-08-01: 50 ug via INTRAVENOUS

## 2014-08-01 MED ORDER — SODIUM CHLORIDE 0.9 % IV SOLN
INTRAVENOUS | Status: DC
  Administered 2014-08-01 – 2014-08-03 (×3): via INTRAVENOUS

## 2014-08-01 MED ORDER — EPHEDRINE SULFATE 50 MG/ML IJ SOLN
INTRAMUSCULAR | Status: DC | PRN
  Administered 2014-08-01 (×2): 10 mg via INTRAVENOUS
  Administered 2014-08-01: 25 mg via INTRAVENOUS
  Administered 2014-08-01: 15 mg via INTRAVENOUS

## 2014-08-01 MED ORDER — SODIUM CHLORIDE 0.9 % IV SOLN: 500.0000 mL | Freq: Once | INTRAVENOUS | Status: AC | PRN

## 2014-08-01 MED ORDER — FENTANYL CITRATE 0.05 MG/ML IJ SOLN
INTRAMUSCULAR | Status: AC
  Filled 2014-08-01: qty 2

## 2014-08-01 MED ORDER — GUAIFENESIN-DM 100-10 MG/5ML PO SYRP
15.0000 mL | ORAL_SOLUTION | ORAL | Status: DC | PRN
  Administered 2014-08-04: 15 mL via ORAL
  Filled 2014-08-01: qty 15

## 2014-08-01 MED ORDER — DEXTROSE 5 % IV SOLN
1.5000 g | Freq: Two times a day (BID) | INTRAVENOUS | Status: AC
  Administered 2014-08-01 – 2014-08-02 (×2): 1.5 g via INTRAVENOUS
  Filled 2014-08-01 (×2): qty 1.5

## 2014-08-01 MED ORDER — HEPARIN SODIUM (PORCINE) 1000 UNIT/ML IJ SOLN
INTRAMUSCULAR | Status: DC | PRN
  Administered 2014-08-01: 10000 [IU] via INTRAVENOUS
  Administered 2014-08-01: 2000 [IU] via INTRAVENOUS
  Administered 2014-08-01 (×2): 5000 [IU] via INTRAVENOUS

## 2014-08-01 MED ORDER — LACTATED RINGERS IV SOLN
INTRAVENOUS | Status: DC
  Administered 2014-08-01: 11:00:00 via INTRAVENOUS

## 2014-08-01 MED ORDER — 0.9 % SODIUM CHLORIDE (POUR BTL) OPTIME
TOPICAL | Status: DC | PRN
  Administered 2014-08-01: 1000 mL

## 2014-08-01 SURGICAL SUPPLY — 72 items
BAG BANDED W/RUBBER/TAPE 36X54 (MISCELLANEOUS) ×3 IMPLANT
BANDAGE ELASTIC 4 VELCRO ST LF (GAUZE/BANDAGES/DRESSINGS) IMPLANT
BANDAGE ESMARK 6X9 LF (GAUZE/BANDAGES/DRESSINGS) IMPLANT
BNDG ESMARK 6X9 LF (GAUZE/BANDAGES/DRESSINGS)
CANISTER SUCTION 2500CC (MISCELLANEOUS) ×3 IMPLANT
CATH BEACON 5.038 65CM KMP-01 (CATHETERS) ×3 IMPLANT
CATH EMB 3FR 80CM (CATHETERS) ×3 IMPLANT
CATH EMB 4FR 80CM (CATHETERS) ×3 IMPLANT
CLIP TI MEDIUM 24 (CLIP) ×3 IMPLANT
CLIP TI WIDE RED SMALL 24 (CLIP) ×3 IMPLANT
COVER BACK TABLE 60X90IN (DRAPES) ×3 IMPLANT
CUFF TOURNIQUET SINGLE 24IN (TOURNIQUET CUFF) IMPLANT
CUFF TOURNIQUET SINGLE 34IN LL (TOURNIQUET CUFF) IMPLANT
CUFF TOURNIQUET SINGLE 44IN (TOURNIQUET CUFF) IMPLANT
DRAIN CHANNEL 15F RND FF W/TCR (WOUND CARE) IMPLANT
DRAPE X-RAY CASS 24X20 (DRAPES) IMPLANT
DRSG COVADERM 4X10 (GAUZE/BANDAGES/DRESSINGS) IMPLANT
DRSG COVADERM 4X8 (GAUZE/BANDAGES/DRESSINGS) IMPLANT
ELECT BLADE 4.0 EZ CLEAN MEGAD (MISCELLANEOUS) ×3
ELECT REM PT RETURN 9FT ADLT (ELECTROSURGICAL) ×3
ELECTRODE BLDE 4.0 EZ CLN MEGD (MISCELLANEOUS) ×1 IMPLANT
ELECTRODE REM PT RTRN 9FT ADLT (ELECTROSURGICAL) ×1 IMPLANT
EVACUATOR SILICONE 100CC (DRAIN) IMPLANT
GAUZE SPONGE 4X4 16PLY XRAY LF (GAUZE/BANDAGES/DRESSINGS) ×3 IMPLANT
GLOVE BIO SURGEON STRL SZ 6.5 (GLOVE) ×8 IMPLANT
GLOVE BIO SURGEONS STRL SZ 6.5 (GLOVE) ×4
GLOVE BIOGEL PI IND STRL 6.5 (GLOVE) ×1 IMPLANT
GLOVE BIOGEL PI IND STRL 7.0 (GLOVE) ×1 IMPLANT
GLOVE BIOGEL PI IND STRL 7.5 (GLOVE) ×3 IMPLANT
GLOVE BIOGEL PI INDICATOR 6.5 (GLOVE) ×2
GLOVE BIOGEL PI INDICATOR 7.0 (GLOVE) ×2
GLOVE BIOGEL PI INDICATOR 7.5 (GLOVE) ×6
GLOVE ECLIPSE 6.5 STRL STRAW (GLOVE) ×6 IMPLANT
GLOVE SURG SS PI 7.5 STRL IVOR (GLOVE) ×9 IMPLANT
GOWN STRL REUS W/ TWL LRG LVL3 (GOWN DISPOSABLE) ×5 IMPLANT
GOWN STRL REUS W/ TWL XL LVL3 (GOWN DISPOSABLE) ×2 IMPLANT
GOWN STRL REUS W/TWL LRG LVL3 (GOWN DISPOSABLE) ×10
GOWN STRL REUS W/TWL XL LVL3 (GOWN DISPOSABLE) ×4
HEMOSTAT SNOW SURGICEL 2X4 (HEMOSTASIS) IMPLANT
KIT BASIN OR (CUSTOM PROCEDURE TRAY) ×3 IMPLANT
KIT ROOM TURNOVER OR (KITS) ×3 IMPLANT
LIQUID BAND (GAUZE/BANDAGES/DRESSINGS) ×3 IMPLANT
MARKER GRAFT CORONARY BYPASS (MISCELLANEOUS) IMPLANT
NS IRRIG 1000ML POUR BTL (IV SOLUTION) ×6 IMPLANT
PACK PERIPHERAL VASCULAR (CUSTOM PROCEDURE TRAY) ×3 IMPLANT
PAD ARMBOARD 7.5X6 YLW CONV (MISCELLANEOUS) ×6 IMPLANT
PADDING CAST COTTON 6X4 STRL (CAST SUPPLIES) IMPLANT
POLYURETHANE IRRIGATION OCCLUSION CATHETER ×3 IMPLANT
PROBE PENCIL 8 MHZ STRL DISP (MISCELLANEOUS) ×3 IMPLANT
PROTECTION STATION PRESSURIZED (MISCELLANEOUS) ×3
SET COLLECT BLD 21X3/4 12 (NEEDLE) IMPLANT
SHIELD RADPAD SCOOP 12X17 (MISCELLANEOUS) ×6 IMPLANT
STATION PROTECTION PRESSURIZED (MISCELLANEOUS) ×1 IMPLANT
STOPCOCK 4 WAY LG BORE MALE ST (IV SETS) IMPLANT
SUT ETHILON 3 0 PS 1 (SUTURE) IMPLANT
SUT PROLENE 5 0 C 1 24 (SUTURE) ×12 IMPLANT
SUT PROLENE 6 0 BV (SUTURE) ×3 IMPLANT
SUT PROLENE 7 0 BV 1 (SUTURE) IMPLANT
SUT SILK 2 0 SH (SUTURE) IMPLANT
SUT SILK 3 0 (SUTURE)
SUT SILK 3-0 18XBRD TIE 12 (SUTURE) IMPLANT
SUT VIC AB 2-0 CT1 27 (SUTURE) ×4
SUT VIC AB 2-0 CT1 TAPERPNT 27 (SUTURE) ×2 IMPLANT
SUT VIC AB 3-0 SH 27 (SUTURE) ×2
SUT VIC AB 3-0 SH 27X BRD (SUTURE) ×1 IMPLANT
SUT VICRYL 4-0 PS2 18IN ABS (SUTURE) ×3 IMPLANT
SYRINGE 3CC LL L/F (MISCELLANEOUS) ×3 IMPLANT
TRAY FOLEY CATH 16FRSI W/METER (SET/KITS/TRAYS/PACK) ×3 IMPLANT
TUBING EXTENTION W/L.L. (IV SETS) IMPLANT
UNDERPAD 30X30 INCONTINENT (UNDERPADS AND DIAPERS) ×3 IMPLANT
WATER STERILE IRR 1000ML POUR (IV SOLUTION) ×3 IMPLANT
WIRE BENTSON .035X145CM (WIRE) ×3 IMPLANT

## 2014-08-01 NOTE — Progress Notes (Addendum)
  Day of Surgery Note    Subjective:  Pt is in PACU-using bedpan per RN  Filed Vitals:   08/01/14 1530  BP:   Pulse: 69  Temp:   Resp: 17     Extremities:  RN reports he has palpable right DP as well as doppler signals   Assessment/Plan:  This is a 49 y.o. male who is s/p  #1: Thrombectomy, right superficial femoral, popliteal, anterior tibial, peroneal artery #2: Above knee right popliteal artery exposure #3: Right lower extremity angiogram was selected images of the anterior tibial and peroneal artery with first order catheter placement   -pt using bedpan for BM-pt's nurse reports pt has palpable right DP as well as doppler signals are present -start heparin - no bolus -transfer to 3 [facility] if bed available-if no bed available-[facility]   Samantha Rhyne, PA-C 08/01/2014 3:37 PM   Spoke with Hospitalist.  Patient will need to be d/c'd home on long term anti-coagulation.  He will also need an outpatient hematology appointment.  Ok to d/c once pain is controlled.  WElls [employer] CommunicationsBRabham

## 2014-08-01 NOTE — Interval H&P Note (Signed)
History and Physical Interval Note:  08/01/2014 11:01 AM  Rickey Calderon  has presented today for surgery, with the diagnosis of Thrombus Right Anterior Tibial Artery  I74.3  The various methods of treatment have been discussed with the patient and family. After consideration of risks, benefits and other options for treatment, the patient has consented to  Procedure(s): THROMBECTOMY RIGHT LEG; per VWB-may use fluoro (Right) as a surgical intervention .  The patient's history has been reviewed, patient examined, no change in status, stable for surgery.  I have reviewed the patient's chart and labs.  Questions were answered to the patient's satisfaction.     Isac Lincks IV, V. WELLS

## 2014-08-01 NOTE — H&P (View-Only) (Signed)
    Subjective  -   No complaints   Physical Exam:  Cannulation site is without complication Respirations nonlabored Abdomen soft       Assessment/Plan:    Embolic workup is in progress.  2-D echo has been completed but not finalized.  CT angiogram of the chest has been ordered but we'll delay for hydration given Carlyon ProwsAngie Graham yesterday.  After reviewing the images yesterday, I'm concerned that there may be acute thrombus.  The complicating issue is the patient's body habitus and risk of wound complications if a groin incision is performed.  After reviewing the images, I will consider attempting thrombectomy of the anterior tibial artery which would be an incision on the lateral side of the leg below the knee, to try to improve his blood flow.  If this is the ultimate plan, and will likely be on Thursday or Friday  Jacey Eckerson IV, V. WELLS 07/29/2014 3:44 PM --  Filed Vitals:   07/29/14 1502  BP: 109/77  Pulse: 67  Temp: 98.4 F (36.9 C)  Resp: 20    Intake/Output Summary (Last 24 hours) at 07/29/14 1544 Last data filed at 07/29/14 1245  Gross per 24 hour  Intake    720 ml  Output      0 ml  Net    720 ml     Laboratory CBC    Component Value Date/Time   WBC 10.2 07/29/2014 0446   HGB 12.1* 07/29/2014 0446   HCT 39.2 07/29/2014 0446   PLT 252 07/29/2014 0446    BMET    Component Value Date/Time   NA 140 07/28/2014 0514   K 4.1 07/28/2014 0514   CL 101 07/28/2014 0514   CO2 32 07/28/2014 0514   GLUCOSE 113* 07/28/2014 0514   BUN 14 07/28/2014 0514   CREATININE 1.17 07/28/2014 1500   CALCIUM 8.8 07/28/2014 0514   GFRNONAA 72* 07/28/2014 1500   GFRAA 83* 07/28/2014 1500    COAG Lab Results  Component Value Date   INR 1.12 07/25/2014   No results found for: PTT  Antibiotics Anti-infectives    None       V. Charlena CrossWells Gabriele Loveland IV, M.D. [facility] [place] Office: [phone] Pager:  [phone]

## 2014-08-01 NOTE — Progress Notes (Signed)
Called Dr. Maple HudsonMoser, current vital signs given in order for this nurse to know to give PO Lopressor 100mg , hold for now per Dr. Maple HudsonMoser.

## 2014-08-01 NOTE — Transfer of Care (Signed)
Immediate Anesthesia Transfer of Care Note  Patient: Rickey Calderon  Procedure(s) Performed: Procedure(s): THROMBECTOMY RIGHT LEG SFA, Anterior tibial, and perineal arteries with intraoperative arteriograms (Right)  Patient Location: PACU  Anesthesia Type:General  Level of Consciousness: sedated  Airway & Oxygen Therapy: Patient Spontanous Breathing and Patient connected to face mask oxygen  Post-op Assessment: Report given to RN and Post -op Vital signs reviewed and stable  Post vital signs: Reviewed and stable  Last Vitals:  Filed Vitals:   08/01/14 1504  BP: 136/88  Pulse: 65  Temp: 36.4 C  Resp: 16    Complications: No apparent anesthesia complications

## 2014-08-01 NOTE — Anesthesia Preprocedure Evaluation (Addendum)
Anesthesia Evaluation  Patient identified by MRN, date of birth, ID band Patient awake    Reviewed: Allergy & Precautions, NPO status , Patient's Chart, lab work & pertinent test results  History of Anesthesia Complications Negative for: history of anesthetic complications  Airway Mallampati: III  TM Distance: >3 FB Neck ROM: Full    Dental  (+) Teeth Intact, Dental Advisory Given   Pulmonary asthma , sleep apnea and Continuous Positive Airway Pressure Ventilation ,  breath sounds clear to auscultation        Cardiovascular hypertension, Pt. on medications and Pt. on home beta blockers - angina- CHF Rhythm:Regular Rate:Normal     Neuro/Psych Depression negative neurological ROS     GI/Hepatic negative GI ROS, Neg liver ROS,   Endo/Other  Morbid obesity  Renal/GU negative Renal ROS     Musculoskeletal   Abdominal   Peds  Hematology   Anesthesia Other Findings   Reproductive/Obstetrics                            Anesthesia Physical Anesthesia Plan  ASA: III  Anesthesia Plan: General   Post-op Pain Management:    Induction: Intravenous  Airway Management Planned: Oral ETT  Additional Equipment: None  Intra-op Plan:   Post-operative Plan: Extubation in OR and Possible Post-op intubation/ventilation  Informed Consent: I have reviewed the patients History and Physical, chart, labs and discussed the procedure including the risks, benefits and alternatives for the proposed anesthesia with the patient or authorized representative who has indicated his/her understanding and acceptance.   Dental advisory given  Plan Discussed with: CRNA and Surgeon  Anesthesia Plan Comments:        Anesthesia Quick Evaluation

## 2014-08-01 NOTE — Op Note (Signed)
Patient name: Rickey Calderon MRN: [id number] DOB: [birth year] Sex: male  07/24/2014 - 08/01/2014 Pre-operative Diagnosis: Right lower extremity thrombus Post-operative diagnosis:  Same Surgeon:  Jorge NyBRABHAM IV, V. WELLS Assistants:  Doreatha MassedSamantha Rhyne Procedure:   #1: Thrombectomy, right superficial femoral, popliteal, anterior tibial, peroneal artery   #2: Above knee right popliteal artery exposure   #3: Right lower extremity angiogram was selected images of the anterior tibial and peroneal artery with first order catheter placement Anesthesia:  Gen. Blood Loss:  See anesthesia record Specimens:  Thrombus, right leg  Findings:  Relatively fresh, acute thrombus was evacuated from the superficial femoral artery without significant difficulty.  I used an over-the-wire Fogarty catheter to selectively remove clot from the anterior tibial and peroneal artery.  Selective images of the anterior tibial and peroneal artery were obtained.  Indications:  The patient presented somewhat atypically with a one-week history of right groin pain.  Subsequent workup revealed thrombus within his right lower extremity.  This appeared to be relatively chronic on and she oh, based on collaterals that were present.  However there did appear to be a possible acute component.  Therefore discussed with the patient proceeding with operative exploration to see if we can reestablish blood flow to his right leg.  At no point in time was his right leg frankly ischemic.  Procedure:  The patient was identified in the holding area and [facility]  The patient was then placed supine on the table. general anesthesia was administered.  The patient was prepped and draped in the usual sterile fashion.  A time out was called and antibiotics were administered.  An incision on the medial side of the leg above the knee was made.  Cautery was used to divide subcutaneous tissue down to the fascia which was opened sharply.  I then explored  the popliteal fossa and identified the popliteal artery.  This was circumferentially dissected free and encircled with Vesseloops.  The artery was approximately 8 mm and without any significant plaque.  The patient was fully heparinized.  I monitored his heparin levels with a ACTs.  Despite multiple boluses, we had difficulty getting the patient therapeutic.  I made a transverse arteriotomy on the popliteal artery.  A #4 Fogarty catheter was advanced retrograde into the iliac arterial system and thrombectomy was performed.  Long subacute/acute thrombus was evacuated.  Excellent inflow was established.  A negative past was then made.  Next, I used a Kumpe catheter to navigate into the peroneal artery.  A over-the-wire thrombectomy was performed.  The balloon appeared to injure the artery leaving some small pseudoaneurysm present.  This was confirmed with imaging with catheter in the peroneal artery.  I then navigated the wire into the anterior tibial artery.  Selective images with the catheter in the anterior tibial artery was then performed.  Over-the-wire thrombectomy was performed here.  The clot appeared to get stuck in the popliteal artery which ultimately was able to be evacuated with a #3 Fogarty catheter.  Good antegrade and retrograde bleeding was confirmed.  The arteriotomy was closed with running 5-0 Prolene.  When interrogated with Doppler ultrasound, I did not like the signal and therefore I reoccluded the artery and opened the arteriotomy.  I passed Fogarty catheters proximal distal.  No clot was evacuated and again there was excellent bleeding.  The arteriotomy was then closed transversely with 5-0 Prolene.  I did not give any protamine.  Once hemostasis was satisfactory after confirming that  there was a pulse within the popliteal artery, the fascia was closed with 2-0 Vicryl the subcutaneous tissue was closed with 2 layers of 2-0 Vicryl the skin was closed with 4-0 Vicryl.  Dermabond was  applied.  Disposition:  To PACU in stable condition.   Juleen China, M.D. [facility] [place] Office: [phone] Pager:  [phone]

## 2014-08-01 NOTE — Progress Notes (Addendum)
ANTICOAGULATION CONSULT NOTE - Initial Consult  Pharmacy Consult for Heparin Indication: s/p thrombectomy 2/5 of R leg thrombus  No Known Allergies  Patient Measurements: Height: 5\' 7"  (170.2 cm) Weight: (!) 374 lb 12.5 oz (170 kg) IBW/kg (Calculated) : 66.1 Heparin Dosing Weight: 109 kg  Vital Signs: Temp: 97.6 F (36.4 C) (02/05 1504) BP: 114/80 mmHg (02/05 1645) Pulse Rate: 58 (02/05 1700)  Labs:  Recent Labs  07/30/14 0310 07/31/14 0421 08/01/14 0508  HGB 11.8* 11.7* 11.6*  HCT 38.2* 37.9* 37.5*  PLT 263 261 260  LABPROT  --  14.4  --   INR  --  1.11  --   CREATININE  --  1.23 1.25    Estimated Creatinine Clearance: 108.9 mL/min (by C-G formula based on Cr of 1.25).   Medical History: Past Medical History  Diagnosis Date  . Hypertension   . Gout   . Prostatitis   . Bronchitis   . Depression   . Seasonal allergies     Medications:  Prescriptions prior to admission  Medication Sig Dispense Refill Last Dose  . allopurinol (ZYLOPRIM) 300 MG tablet Take 300 mg by mouth daily.   07/23/2014 at Unknown time  . amLODipine (NORVASC) 10 MG tablet Take 10 mg by mouth daily.   07/23/2014 at Unknown time  . desloratadine (CLARINEX) 5 MG tablet Take 5 mg by mouth daily.   07/23/2014 at Unknown time  . doxazosin (CARDURA) 4 MG tablet Take 4 mg by mouth daily.   07/23/2014 at Unknown time  . Fluticasone-Salmeterol (ADVAIR) 100-50 MCG/DOSE AEPB Inhale 1 puff into the lungs 2 (two) times daily.   Past Week at Unknown time  . hydrochlorothiazide (HYDRODIURIL) 25 MG tablet Take 25 mg by mouth daily.   07/23/2014 at Unknown time  . lisinopril (PRINIVIL,ZESTRIL) 40 MG tablet Take 40 mg by mouth daily.   07/23/2014 at Unknown time  . metoprolol (LOPRESSOR) 100 MG tablet Take 100 mg by mouth 2 (two) times daily.   07/23/2014 at 2400  . mometasone (NASONEX) 50 MCG/ACT nasal spray Place 1 spray into the nose 2 (two) times daily.   07/21/2014 at unknown  . Multiple Vitamin (MULTIVITAMIN  WITH MINERALS) TABS tablet Take 1 tablet by mouth daily.   07/23/2014 at Unknown time  . naproxen sodium (ANAPROX) 220 MG tablet Take 220 mg by mouth 2 (two) times daily with a meal.   07/23/2014 at Unknown time  . potassium chloride SA (K-DUR,KLOR-CON) 20 MEQ tablet Take 20 mEq by mouth daily.   07/23/2014 at Unknown time  . sertraline (ZOLOFT) 100 MG tablet Take 200 mg by mouth daily.   07/23/2014 at Unknown time    Assessment: 49 y.o. obese male known to pharmacy from previous heparin dosing for suspected R leg thrombus. S/p thrombectomy 2/5. Order to restart heparin post-op in PACU with no bolus. CBC stable.  Pt with therapeutic heparin level previous this admission on 1900 units/hr.  Goal of Therapy:  Heparin level 0.3-0.7 units/ml Monitor platelets by anticoagulation protocol: Yes   Plan:  Heparin 1900 units/hr. No bolus. Will f/u 6 hour heparin level Daily heparin level and CBC  Christoper Fabianaron Archita Lomeli, PharmD, BCPS Clinical pharmacist, pager [phone] 08/01/2014,5:02 PM   Addendum: Order for coumadin to start tonight. Baseline INR 1.11 on 2/4.  Plan: Coumadin 10mg  po tonight Daily INR  Christoper Fabianaron Teala Daffron, PharmD, BCPS Clinical pharmacist, pager [phone] 08/01/2014 5:53 PM

## 2014-08-01 NOTE — Anesthesia Postprocedure Evaluation (Signed)
  Anesthesia Post-op Note  Patient: Rickey Calderon  Procedure(s) Performed: Procedure(s): THROMBECTOMY RIGHT LEG SFA, Anterior tibial, and perineal arteries with intraoperative arteriograms (Right)  Patient Location: PACU  Anesthesia Type:General  Level of Consciousness: awake  Airway and Oxygen Therapy: Patient Spontanous Breathing  Post-op Pain: mild  Post-op Assessment: Post-op Vital signs reviewed, Patient's Cardiovascular Status Stable, Respiratory Function Stable, Patent Airway, No signs of Nausea or vomiting and Pain level controlled  Post-op Vital Signs: Reviewed and stable  Last Vitals:  Filed Vitals:   08/01/14 1726  BP: 139/79  Pulse: 68  Temp: 36.6 C  Resp: 16    Complications: No apparent anesthesia complications

## 2014-08-01 NOTE — Progress Notes (Signed)
CPAP set up at bedside, auto titrate settings, with FFM.  Patient states he is not ready to be placed on at this time. Patient encouraged to call for RT when ready for CPAP.

## 2014-08-01 NOTE — Progress Notes (Signed)
[facility] PROGRESS NOTE  Rickey Calderon WUJ:[id number] DOB: June 11, 1966 DOA: 07/24/2014 PCP: No PCP Per Patient, recently moved to [place] from    Brief narrative: 49 y/o obese male with OSA on CPAP,HTN, gout, and chr prostatitis admitted with rt groin pain and claudication. Pt transferred from [facility] to [facility] on 2/1 for Abdominal aortogram. Which did show patent renals, bilateral common iliac, external iliac and hypogastric arteries, there was occlusion of the right superficial femoral artery, and including DP on the right, initially on heparin drip as it was unclear if it was due to versus chronic, which was discontinued later per vascular surgery recommendation, patient had CT chest angiogram by mouth protocol, there was no obvious source of embolus, aorta was not well visualized, patient is being planned for femoral embolectomy by vascular surgery.   Assessment/Plan: Rt groin pain with claudication  - placed on IV heparin initially on admission. vascular following underwent abdominal aortogram showing patent renal, bilateral common iliac, ext iliac and b/l hypogastric arteries.  occlusion of rt superficial femoral artery noted.  -Occluded DP on right. Unclear if this is acute vs chronic. - 2-D echo showing EF 60%, CT chest was PE protocol, though aorta was not well visualized, but no obvious source of thrombus. -off IV heparin - for right leg thrombectomy/embolectomy on Friday -prn pain control.  HTN Continue amlodipine and metoprolol.  Gout  continue allopurinol  OSA  continue bedtime CPAP  Obesity  counseled on weight loss and exercise.    DVT prophylaxis: sq lovenox  Diet: regular  Code Status: full Family Communication: none at bedside Disposition Plan: home once w/up completed   Consultants:  Vascular sx  Procedures:  Abdominal aortogram 2/1  CTangio abd and pelvis 1/29   MRI hip1/31  Antibiotics:  none  HPI/Subjective: Seen and examined .  Reports mild rt groin pain and occasional claudication.   Objective: Filed Vitals:   08/01/14 1031  BP: 128/82  Pulse: 60  Temp:   Resp: 18    Intake/Output Summary (Last 24 hours) at 08/01/14 1104 Last data filed at 08/01/14 1008  Gross per 24 hour  Intake    480 ml  Output      0 ml  Net    480 ml   Filed Weights   07/25/14 0117  Weight: 170 kg (374 lb 12.5 oz)    Exam:   General:  Middle aged obese male in NAD   HEENT: no pallor, moist mucosa, supple neck  Chest: clear b/l, no added sounds  CVS: NS1&S2, no murmurs  GI: soft, NT, ND BS+  Ext: warm, no edema, absent rt Distal pulse  CNS: alert and oriented.     Data Reviewed: Basic Metabolic Panel:  Recent Labs Lab 07/28/14 0514 07/28/14 1500 07/31/14 0421 08/01/14 0508  NA 140  --  138 140  K 4.1  --  4.0 4.2  CL 101  --  100 102  CO2 32  --  31 29  GLUCOSE 113*  --  100* 95  BUN 14  --  15 19  CREATININE 1.12 1.17 1.23 1.25  CALCIUM 8.8  --  8.8 8.8   Liver Function Tests: No results for input(s): AST, ALT, ALKPHOS, BILITOT, PROT, ALBUMIN in the last 168 hours. No results for input(s): LIPASE, AMYLASE in the last 168 hours. No results for input(s): AMMONIA in the last 168 hours. CBC:  Recent Labs Lab 07/28/14 1500 07/29/14 0446 07/30/14 0310 07/31/14 0421 08/01/14 0508  WBC 8.0 10.2  8.9 10.5 9.7  HGB 12.0* 12.1* 11.8* 11.7* 11.6*  HCT 39.2 39.2 38.2* 37.9* 37.5*  MCV 88.5 89.5 88.0 87.7 88.4  PLT 252 252 263 261 260   Cardiac Enzymes: No results for input(s): CKTOTAL, CKMB, CKMBINDEX, TROPONINI in the last 168 hours. BNP (last 3 results) No results for input(s): BNP in the last 8760 hours.  ProBNP (last 3 results) No results for input(s): PROBNP in the last 8760 hours.  CBG:  Recent Labs Lab 07/28/14 0745 07/29/14 0733 07/30/14 0548 07/31/14 0613 08/01/14 0611  GLUCAP 104* 89 111* 100* 124*    Recent Results (from the past 240 hour(s))  Culture, blood (routine x 2)      Status: None   Collection Time: 07/25/14  1:19 AM  Result Value Ref Range Status   Specimen Description BLOOD RIGHT HAND  Final   Special Requests BOTTLES DRAWN AEROBIC ONLY 3CC  Final   Culture   Final    NO GROWTH 5 DAYS Performed at [facility]    Report Status 07/31/2014 FINAL  Final  Culture, blood (routine x 2)     Status: None   Collection Time: 07/25/14  1:54 AM  Result Value Ref Range Status   Specimen Description BLOOD RIGHT ARM  Final   Special Requests BOTTLES DRAWN AEROBIC ONLY 10CC  Final   Culture   Final    NO GROWTH 5 DAYS Performed at [facility]    Report Status 07/31/2014 FINAL  Final  Urine culture     Status: None   Collection Time: 07/25/14  8:25 AM  Result Value Ref Range Status   Specimen Description URINE, CLEAN CATCH  Final   Special Requests NONE  Final   Colony Count   Final    6,000 COLONIES/ML Performed at [facility]    Culture   Final    INSIGNIFICANT GROWTH Performed at [facility]    Report Status 07/26/2014 FINAL  Final  MRSA PCR Screening     Status: None   Collection Time: 07/31/14  8:37 PM  Result Value Ref Range Status   MRSA by PCR NEGATIVE NEGATIVE Final    Comment:        The GeneXpert MRSA Assay (FDA approved for NASAL specimens only), is one component of a comprehensive MRSA colonization surveillance program. It is not intended to diagnose MRSA infection nor to guide or monitor treatment for MRSA infections.      Studies: No results found.  Scheduled Meds: . [MAR Hold] allopurinol  300 mg Oral Daily  . [MAR Hold] amLODipine  10 mg Oral Daily  . [MAR Hold] aspirin  81 mg Oral Daily  .  ceFAZolin (ANCEF) IV  3 g Intravenous On Call  . [MAR Hold] doxazosin  4 mg Oral Daily  . [MAR Hold] enoxaparin (LOVENOX) injection  40 mg Subcutaneous Q24H  . [MAR Hold] fluticasone  1 spray Each Nare Daily  . [MAR Hold] lisinopril  40 mg Oral Daily  . [MAR Hold] loratadine  10 mg Oral  Daily  . [MAR Hold] metoprolol  100 mg Oral BID  . [MAR Hold] mometasone-formoterol  2 puff Inhalation BID  . [MAR Hold] multivitamin with minerals  1 tablet Oral Daily  . [MAR Hold] sertraline  200 mg Oral Daily   Continuous Infusions: . sodium chloride 10 mL/hr at 07/28/14 0635  . lactated ringers 10 mL/hr at 08/01/14 1031     Time spent: 25 minutes  [facility], DAWOOD  [facility] Pager [phone] If 7PM-7AM, please contact night-coverage at [email or web address], password [facility] 08/01/2014, 11:04 AM  LOS: 8 days

## 2014-08-02 DIAGNOSIS — I743 Embolism and thrombosis of arteries of the lower extremities: Principal | ICD-10-CM

## 2014-08-02 LAB — BASIC METABOLIC PANEL
Anion gap: 8 (ref 5–15)
BUN: 15 mg/dL (ref 6–23)
CO2: 28 mmol/L (ref 19–32)
CREATININE: 1.22 mg/dL (ref 0.50–1.35)
Calcium: 8.3 mg/dL — ABNORMAL LOW (ref 8.4–10.5)
Chloride: 99 mmol/L (ref 96–112)
GFR, EST AFRICAN AMERICAN: 79 mL/min — AB (ref >90)
GFR, EST NON AFRICAN AMERICAN: 68 mL/min — AB (ref >90)
Glucose, Bld: 107 mg/dL — ABNORMAL HIGH (ref 70–99)
POTASSIUM: 4.1 mmol/L (ref 3.5–5.1)
Sodium: 135 mmol/L (ref 135–145)

## 2014-08-02 LAB — GLUCOSE, CAPILLARY: GLUCOSE-CAPILLARY: 117 mg/dL — AB (ref 70–99)

## 2014-08-02 LAB — CBC
HCT: 37.7 % — ABNORMAL LOW (ref 39.0–52.0)
Hemoglobin: 11.7 g/dL — ABNORMAL LOW (ref 13.0–17.0)
MCH: 27.5 pg (ref 26.0–34.0)
MCHC: 31 g/dL (ref 30.0–36.0)
MCV: 88.7 fL (ref 78.0–100.0)
PLATELETS: 219 10*3/uL (ref 150–400)
RBC: 4.25 MIL/uL (ref 4.22–5.81)
RDW: 15 % (ref 11.5–15.5)
WBC: 12.1 10*3/uL — ABNORMAL HIGH (ref 4.0–10.5)

## 2014-08-02 LAB — HEPARIN LEVEL (UNFRACTIONATED)
HEPARIN UNFRACTIONATED: 0.25 [IU]/mL — AB (ref 0.30–0.70)
HEPARIN UNFRACTIONATED: 0.52 [IU]/mL (ref 0.30–0.70)
Heparin Unfractionated: 0.66 IU/mL (ref 0.30–0.70)

## 2014-08-02 LAB — PROTIME-INR
INR: 1.18 (ref 0.00–1.49)
Prothrombin Time: 15.1 seconds (ref 11.6–15.2)

## 2014-08-02 MED ORDER — WARFARIN SODIUM 10 MG PO TABS
10.0000 mg | ORAL_TABLET | Freq: Once | ORAL | Status: AC
  Administered 2014-08-02: 10 mg via ORAL
  Filled 2014-08-02: qty 1

## 2014-08-02 NOTE — Evaluation (Signed)
Physical Therapy Evaluation Patient Details Name: Rickey Calderon MRN: [id number] DOB: 03-Jun-1966 Today's Date: 08/02/2014   History of Present Illness  Pt is 49 y/o male admitted for s/p right thrombectomy  Superficial femoral, popliteal, anterior tibial, peroneal artery.     Clinical Impression  Patient is s/p above surgery resulting in functional limitations due to the deficits listed below (see PT Problem List). Pt moving well however will benefit from bariatric RW due to overall right LE pain.  Patient will benefit from skilled PT to increase their independence and safety with mobility to allow discharge to the venue listed below.      Follow Up Recommendations No PT follow up    Equipment Recommendations  Rolling walker with 5" wheels (bariatric ); May benefit from SW consult due to current living situation (living in hotel) or resources to assist pt   Recommendations for Other Services       Precautions / Restrictions Precautions Precautions: None      Mobility  Bed Mobility                  Transfers Overall transfer level: Needs assistance Equipment used: Rolling walker (2 wheeled) Transfers: Sit to/from Stand Sit to Stand: Min guard         General transfer comment: Minguard for safety with cues for hand and LE placement  Ambulation/Gait Ambulation/Gait assistance: Min guard Ambulation Distance (Feet): 60 Feet Assistive device: Rolling walker (2 wheeled) Gait Pattern/deviations: Step-to pattern;Decreased stride length;Shuffle;Antalgic Gait velocity: decrease      Stairs            Wheelchair Mobility    Modified Rankin (Stroke Patients Only)       Balance                                             Pertinent Vitals/Pain Pain Assessment: 0-10 Pain Score: 4  Pain Location: right LE Pain Descriptors / Indicators: Aching Pain Intervention(s): Limited activity within patient's tolerance    Home Living Family/patient  expects to be discharged to:: Other (Comment)                 Additional Comments: Currently living in hotel at this time    Prior Function Level of Independence: Independent               Hand Dominance   Dominant Hand: Right    Extremity/Trunk Assessment               Lower Extremity Assessment: RLE deficits/detail         Communication   Communication: No difficulties  Cognition Arousal/Alertness: Awake/alert Behavior During Therapy: WFL for tasks assessed/performed Overall Cognitive Status: Within Functional Limits for tasks assessed                      General Comments      Exercises        Assessment/Plan    PT Assessment Patient needs continued PT services  PT Diagnosis Difficulty walking;Acute pain;Generalized weakness   PT Problem List Decreased strength;Decreased activity tolerance;Decreased balance;Decreased mobility;Decreased knowledge of use of DME;Pain  PT Treatment Interventions DME instruction;Gait training;Stair training;Functional mobility training;Therapeutic activities;Therapeutic exercise;Balance training;Patient/family education   PT Goals (Current goals can be found in the Care Plan section) Acute Rehab PT Goals Patient Stated Goal: To go home PT Goal Formulation:  With patient Time For Goal Achievement: 08/09/14 Potential to Achieve Goals: Good    Frequency Min 3X/week   Barriers to discharge Other (comment) (Living in hotel at this time )      Co-evaluation               End of Session Equipment Utilized During Treatment: Gait belt Activity Tolerance: Patient tolerated treatment well;Patient limited by pain Patient left: in bed;with family/visitor present;with call bell/phone within reach Nurse Communication: Mobility status         Time: 1354-1420 PT Time Calculation (min) (ACUTE ONLY): 26 min   Charges:   PT Evaluation $Initial PT Evaluation Tier I: 1 Procedure PT Treatments $Gait  Training: 8-22 mins   PT G Codes:        Francelia Mclaren 08/23/2014, 2:40 PM  Jake Shark, PT DPT [phone]

## 2014-08-02 NOTE — Progress Notes (Addendum)
[facility] PROGRESS NOTE  Rickey MazeLennard Calderon WUJ:[id number] DOB: 01/11/1966 DOA: 07/24/2014 PCP: No PCP Per Patient, recently moved to [place] from [place]   Brief narrative: 49 y/o obese male with OSA on CPAP,HTN, gout, and chr prostatitis admitted with rt groin pain and claudication. [facility] for Abdominal aortogram. 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 - Patient had right lower extremity angiogram with thrombectomy of right superficial femoral, popliteal, anterior tibial and peroneal artery, with above knee right popliteal artery exposure, findings were concerning for acute embolus, so patient was started on anticoagulation with heparin drip, being started on warfarin as well.   Assessment/Plan: Rt groin pain with claudication/ acute thrombotic event of right lower extremity  - placed on IV heparin initially on admission. vascular following underwent abdominal aortogram showing patent renal, bilateral common iliac, ext iliac and b/l hypogastric arteries.  occlusion of rt superficial femoral artery noted.  -Occluded DP on right. - 2-D echo showing EF 60%, CT chest was PE protocol, though aorta was not well visualized, but no obvious source of thrombus. -off IV heparin -Patient had right lower extremity angiogram with thrombectomy of right superficial femoral, popliteal, anterior tibial and peroneal artery, with above knee right popliteal artery exposure. - findings were concerning for acute embolus, so patient was started on anticoagulation with heparin drip,  being transitioned to warfarin . - Will need hypercoagulable workup as an outpatient.  HTN Continue amlodipine and metoprolol.  Gout  continue allopurinol  OSA  continue bedtime CPAP  Obesity  counseled on weight loss and exercise.    DVT prophylaxis: sq lovenox  Diet: regular  Code Status: full Family Communication: none at bedside Disposition Plan: home when INR is therapeutic.   Consultants:  Vascular sx Dr. Myra Gianottibrabham  Procedures:  Abdominal aortogram 2/1  CTangio abd and pelvis 1/29   MRI hip1/31  Antibiotics:  none  HPI/Subjective: Seen and examined . Reports mild rt groin pain and occasional claudication.   Objective: Filed Vitals:   08/02/14 1053  BP: 106/65  Pulse:   Temp:   Resp:     Intake/Output Summary (Last 24 hours) at 08/02/14 1354 Last data filed at 08/02/14 0417  Gross per 24 hour  Intake 1603.75 ml  Output   1175 ml  Net 428.75 ml   Filed Weights   07/25/14 0117 08/01/14 1726  Weight: 170 kg (374 lb 12.5 oz) 172.1 kg (379 lb 6.6 oz)    Exam:   General:  Middle aged obese male in NAD   HEENT: no pallor, moist mucosa, supple neck  Chest: clear b/l, no added sounds  CVS: NS1&S2, no murmurs  GI: soft, NT, ND BS+  Ext: warm, no edema, incision at right upper thigh looks clean no oozing or discharge.  CNS: alert and oriented.     Data Reviewed: Basic Metabolic Panel:  Recent Labs Lab 07/28/14 0514 07/28/14 1500 07/31/14 0421 08/01/14 0508 08/02/14 0146  NA 140  --  138 140 135  K 4.1  --  4.0 4.2 4.1  CL 101  --  100 102 99  CO2 32  --  GLUCOSE 113*  --  100* 95 107*  BUN 14  --  CREATININE 1.12 1.17 1.23 1.25 1.22  CALCIUM 8.8  --  8.8 8.8 8.3*   Liver Function Tests: No results for input(s): AST, ALT, ALKPHOS, BILITOT, PROT, ALBUMIN in the last 168 hours. No results for input(s): LIPASE, AMYLASE in the last 168 hours. No results for input(s): AMMONIA in the last 168  hours. CBC:  Recent Labs Lab 07/29/14 0446 07/30/14 0310 07/31/14 0421 08/01/14 0508 08/02/14 0146  WBC 10.2 8.9 10.5 9.7 12.1*  HGB 12.1* 11.8* 11.7* 11.6* 11.7*  HCT 39.2 38.2* 37.9* 37.5* 37.7*  MCV 89.5 88.0 87.7 88.4 88.7  PLT 252 263 261 260 219   Cardiac Enzymes: No results for input(s): CKTOTAL, CKMB, CKMBINDEX, TROPONINI in the last 168 hours. BNP (last 3 results) No results for input(s): BNP in the last 8760 hours.  ProBNP (last 3 results) No results for input(s): PROBNP in the last 8760 hours.  CBG:  Recent Labs Lab 07/29/14 0733 07/30/14 0548 07/31/14 0613 08/01/14 0611 08/02/14 0743  GLUCAP 89 111* 100* 124* 117*    Recent Results (from the past 240 hour(s))  Culture, blood (routine x 2)     Status: None   Collection Time: 07/25/14  1:19 AM  Result Value Ref Range Status   Specimen Description BLOOD RIGHT HAND  Final   Special Requests BOTTLES DRAWN AEROBIC ONLY 3CC  Final   Culture   Final    NO GROWTH 5 DAYS Performed at [facility]    Report Status 07/31/2014 FINAL  Final  Culture, blood (routine x 2)     Status: None   Collection Time: 07/25/14  1:54 AM  Result Value Ref Range Status   Specimen Description BLOOD RIGHT ARM  Final   Special Requests BOTTLES DRAWN AEROBIC ONLY 10CC  Final   Culture   Final    NO GROWTH 5 DAYS Performed at [facility]    Report Status 07/31/2014 FINAL  Final  Urine culture     Status: None   Collection Time: 07/25/14  8:25 AM  Result Value Ref Range Status   Specimen Description URINE, CLEAN CATCH  Final   Special Requests NONE  Final   Colony Count   Final    6,000 COLONIES/ML Performed at [facility]    Culture   Final    INSIGNIFICANT GROWTH Performed at [facility]    Report Status 07/26/2014 FINAL  Final  MRSA PCR Screening     Status: None   Collection Time: 07/31/14  8:37 PM  Result Value Ref Range Status   MRSA by PCR NEGATIVE NEGATIVE Final    Comment:         The GeneXpert MRSA Assay (FDA approved for NASAL specimens only), is one component of a comprehensive MRSA colonization surveillance program. It is not intended to diagnose MRSA infection nor to guide or monitor treatment for MRSA infections.      Studies: No results found.  Scheduled Meds: . allopurinol  300 mg Oral Daily  . amLODipine  10 mg Oral Daily  . aspirin  81 mg Oral Daily  . coumadin book   Does not apply Once  . docusate sodium  100 mg Oral Daily  . doxazosin  4 mg Oral Daily  . fluticasone  1 spray Each Nare Daily  . lisinopril  40 mg Oral Daily  . loratadine  10 mg Oral Daily  . metoprolol  100 mg Oral BID  . mometasone-formoterol  2 puff Inhalation BID  . multivitamin with minerals  1 tablet Oral Daily  . pantoprazole  40 mg Oral Daily  . sertraline  200 mg Oral Daily  . warfarin   Does not apply Once  . Warfarin - Pharmacist Dosing Inpatient   Does not apply q1800   Continuous Infusions: . sodium chloride 75 mL/hr at 08/01/14 1814  . heparin 1,900 Units/hr (08/02/14 0931)     Time spent: 25 minutes    [facility], [place]  [facility] Pager [phone] If 7PM-7AM, please contact night-coverage at [email or web address], password [facility] 08/02/2014, 1:54 PM  LOS: 9 days

## 2014-08-02 NOTE — Progress Notes (Addendum)
ANTICOAGULATION CONSULT NOTE - Follow Up Consult  Pharmacy Consult for Heparin  Indication: s/p thrombectomy   No Known Allergies  Patient Measurements: Height: 5\' 7"  (170.2 cm) Weight: (!) 379 lb 6.6 oz (172.1 kg) IBW/kg (Calculated) : 66.1  Vital Signs: Temp: 98.1 F (36.7 C) (02/06 0950) Temp Source: Oral (02/06 0950) BP: 106/65 mmHg (02/06 1053) Pulse Rate: 73 (02/06 0950)  Labs:  Recent Labs  07/31/14 0421 08/01/14 0508 08/02/14 0146 08/02/14 1040  HGB 11.7* 11.6* 11.7*  --   HCT 37.9* 37.5* 37.7*  --   PLT 261 260 219  --   LABPROT 14.4  --  15.1  --   INR 1.11  --  1.18  --   HEPARINUNFRC  --   --  0.25* 0.52  CREATININE 1.23 1.25 1.22  --     Estimated Creatinine Clearance: 112.4 mL/min (by C-G formula based on Cr of 1.22).   . sodium chloride 75 mL/hr at 08/01/14 1814  . heparin 1,900 Units/hr (08/02/14 0931)      Assessment: 49 yo male on IV heparin for thrombosis in R femoral vessel s/p thrombectomy.   Sub-therapeutic heparin level earlier. Per RN, having IV site issues earlier and heparin was on/off for a few hours. Pt now has stable IV site.   Heparin level currently therapeutic (0.52) on 1900 units/hr.  No bleeding or complications noted.  Also started on Coumadin last night, INR 1.18.  Goal of Therapy:  Heparin level 0.3-0.7 units/ml Monitor platelets by anticoagulation protocol: Yes  INR 2-3   Plan:  -Continue heparin at 1900 units/hr. -Confirm heparin level in 6 hrs. -Daily CBC/HL -Monitor for bleeding -Coumadin 10 mg x 1 tonight. -Continue daily PT/INR.  Tad MooreJessica Demani Mcbrien, Pharm D, BCPS  Clinical Pharmacist Pager [phone]  08/02/2014 4:58 PM

## 2014-08-02 NOTE — Progress Notes (Signed)
ANTICOAGULATION CONSULT NOTE - Follow Up Consult  Pharmacy Consult for Heparin  Indication: s/p thrombectomy   No Known Allergies  Patient Measurements: Height: 5\' 7"  (170.2 cm) Weight: (!) 379 lb 6.6 oz (172.1 kg) IBW/kg (Calculated) : 66.1  Vital Signs: Temp: 97.7 F (36.5 C) (02/05 2355) Temp Source: Oral (02/05 2355) BP: 113/75 mmHg (02/05 2355) Pulse Rate: 76 (02/06 0122)  Labs:  Recent Labs  07/31/14 0421 08/01/14 0508 08/02/14 0146  HGB 11.7* 11.6* 11.7*  HCT 37.9* 37.5* 37.7*  PLT 261 260 219  LABPROT 14.4  --  15.1  INR 1.11  --  1.18  HEPARINUNFRC  --   --  0.25*  CREATININE 1.23 1.25 1.22    Estimated Creatinine Clearance: 112.4 mL/min (by C-G formula based on Cr of 1.22).   Assessment: Sub-therapeutic heparin level. Per RN, having IV site issues earlier and heparin was on/off for a few hours. Pt now has stable IV site.   Goal of Therapy:  Heparin level 0.3-0.7 units/ml Monitor platelets by anticoagulation protocol: Yes   Plan:  -Continue heparin at 1900 units/hr given previous IV site issues -1100 HL -Daily CBC/HL -Monitor for bleeding  Abran DukeLedford, Halla Chopp 08/02/2014,2:47 AM

## 2014-08-02 NOTE — Progress Notes (Signed)
ANTICOAGULATION CONSULT NOTE - Follow Up Consult  Pharmacy Consult for Heparin  Indication: s/p thrombectomy   No Known Allergies  Patient Measurements: Height: 5\' 7"  (170.2 cm) Weight: (!) 382 lb 4.8 oz (173.41 kg) IBW/kg (Calculated) : 66.1  Vital Signs: Temp: 98.2 F (36.8 C) (02/06 1446) Temp Source: Oral (02/06 1446) BP: 113/69 mmHg (02/06 1446) Pulse Rate: 72 (02/06 1446)  Labs:  Recent Labs  07/31/14 0421 08/01/14 0508 08/02/14 0146 08/02/14 1040 08/02/14 1650  HGB 11.7* 11.6* 11.7*  --   --   HCT 37.9* 37.5* 37.7*  --   --   PLT 261 260 219  --   --   LABPROT 14.4  --  15.1  --   --   INR 1.11  --  1.18  --   --   HEPARINUNFRC  --   --  0.25* 0.52 0.66  CREATININE 1.23 1.25 1.22  --   --     Estimated Creatinine Clearance: 112.9 mL/min (by C-G formula based on Cr of 1.22).   . sodium chloride 75 mL/hr at 08/01/14 1814  . heparin 1,900 Units/hr (08/02/14 0931)      Assessment: 49 yo male on IV heparin for thrombosis in R femoral vessel s/p thrombectomy.   Sub-therapeutic heparin level earlier. Per RN, having IV site issues earlier and heparin was on/off for a few hours. Pt now has stable IV site.   Heparin level currently therapeutic (0.66) on 1900 units/hr.  No bleeding or complications noted.  Also started on Coumadin last night, INR 1.18.  Goal of Therapy:  Heparin level 0.3-0.7 units/ml Monitor platelets by anticoagulation protocol: Yes  INR 2-3   Plan:  -Continue heparin at 1900 units/hr. -Confirm heparin level in 6 hrs. -Daily CBC/HL -Monitor for bleeding -Coumadin 10 mg x 1 tonight. -Continue daily PT/INR.  Tad MooreJessica Catrinia Racicot, Pharm D, BCPS  Clinical Pharmacist Pager [phone]  08/02/2014 6:00 PM

## 2014-08-02 NOTE — Progress Notes (Signed)
Placed patient on CPAP via [facility], auto settings with 3 lpm O2 bleed in.  Patient tolerating well at this time.  

## 2014-08-02 NOTE — Progress Notes (Signed)
Subjective: Interval History: none.Marland Kitchen Up in chair. Reports incisional soreness. No right foot ischemia symptoms  Objective: Vital signs in last 24 hours: Temp:  [97.5 F (36.4 C)-97.9 F (36.6 C)] 97.9 F (36.6 C) (02/06 0748) Pulse Rate:  [58-76] 73 (02/06 0748) Resp:  [15-23] 22 (02/06 0748) BP: (104-139)/(63-88) 112/68 mmHg (02/06 0748) SpO2:  [93 %-100 %] 93 % (02/06 0748) Weight:  [379 lb 6.6 oz (172.1 kg)] 379 lb 6.6 oz (172.1 kg) (02/05 1726)  Intake/Output from previous day: 02/05 0701 - 02/06 0700 In: 1843.8 [P.O.:240; I.V.:1553.8; IV Piggyback:50] Out: 1175 [Urine:1075; Blood:100] Intake/Output this shift:    Above-knee popliteal incision with no evidence of hematoma. Foot well-perfused with motor and sensory intact. Posterior tibial signal with no signal in the anterior tibial or peroneal this morning  Lab Results:  Recent Labs  08/01/14 0508 08/02/14 0146  WBC 9.7 12.1*  HGB 11.6* 11.7*  HCT 37.5* 37.7*  PLT 260 219   BMET  Recent Labs  08/01/14 0508 08/02/14 0146  NA 140 135  K 4.2 4.1  CL 102 99  CO2 29 28  GLUCOSE 95 107*  BUN 19 15  CREATININE 1.25 1.22  CALCIUM 8.8 8.3*    Studies/Results: Ct Abdomen Pelvis Wo Contrast  07/24/2014   CLINICAL DATA:  Right inguinal pain for 5 days.  EXAM: CT ABDOMEN AND PELVIS WITHOUT CONTRAST  TECHNIQUE: Multidetector CT imaging of the abdomen and pelvis was performed following the standard protocol without IV contrast.  COMPARISON:  None.  FINDINGS: No urinary calculi are evident. There is no hydronephrosis or ureteral dilatation. There is an upper pole right renal cyst measuring 3.3 cm, not characterized in the absence of intravenous contrast.  The appendix is normal.  There is inflammatory stranding surrounding the right common femoral artery and vein in the inguinal region, extending proximally around the right external iliac artery and vein and continuing all the way up to the level of the aortic bifurcation.  This inflammatory stranding is not accompanied by a drainable fluid collection or soft tissue gas. No adenopathy is evident. There is no hernia. No other inflammatory changes are evident elsewhere in the abdomen or pelvis.  There are grossly unremarkable unenhanced appearances of the liver, spleen, pancreas and adrenals. Mesentery and bowel appear unremarkable except for uncomplicated colonic diverticulosis. No significant abnormalities are evident in the lower chest.  IMPRESSION: 1. Inflammatory-appearing stranding around the right common femoral artery and vein in the inguinal region, extending proximally up to the level of the aortic bifurcation. This might represent a thrombophlebitis. 2. No evidence of an inguinal hernia, adenopathy or mass. 3. No urinary calculi. No hydronephrosis or ureteral dilatation. Normal appendix.   Electronically Signed   By: Ellery Plunk M.D.   On: 07/24/2014 22:01   Ct Angio Chest Pe W/cm &/or Wo Cm  07/30/2014   CLINICAL DATA:  Shortness of breath with exertion. Groin pain. Thrombus of pulmonary vein.  EXAM: CT ANGIOGRAPHY CHEST WITH CONTRAST  TECHNIQUE: Multidetector CT imaging of the chest was performed using the standard protocol during bolus administration of intravenous contrast. Multiplanar CT image reconstructions and MIPs were obtained to evaluate the vascular anatomy.  CONTRAST:  OMNIPAQUE IOHEXOL 350 MG/ML SOLN  COMPARISON:  None.  FINDINGS: Significant patient motion artifact, suboptimal contrast bolus timing, and soft tissue attenuation from body habitus limits evaluation. There are no filling defects within the main or lobar branches. The subsegmental branches are not well assessed.  There is multi chamber cardiomegaly.  There is likely mild aneurysmal dilatation of the mid ascending aorta, measuring 3.8- 4.0 cm, however accurate caliber placement is difficult due to motion through this region. There is no pleural or pericardial effusion. There is no  mediastinal or hilar adenopathy.  Right and left lower lobe linear atelectasis. No consolidation to suggest pneumonia.  No acute abnormality in the included upper abdomen. There is degenerative change in the mid lower thoracic spine.  Review of the MIP images confirms the above findings.  IMPRESSION: 1. Limited assessment demonstrates no central pulmonary embolus. Please note the segmental in subsegmental branches are are not well assessed. 2. Cardiomegaly. Suspect mild aneurysmal dilatation of the ascending aorta, up to 4 cm. Recommend annual imaging followup by CTA or MRA. This recommendation follows 2010 ACCF/AHA/AATS/ACR/ASA/SCA/SCAI/SIR/STS/SVM Guidelines for the Diagnosis and Management of Patients with Thoracic Aortic Disease. Circulation. 2010; 121: O130-Q657: [id number]   Electronically Signed   By: Rubye OaksMelanie  Ehinger M.D.   On: 07/30/2014 01:45   Ct Angio Pelvis W/cm &/or Wo/cm  07/25/2014   CLINICAL DATA:  Right groin pain.  EXAM: CT ANGIOGRAPHY OF PELVIS  TECHNIQUE: Multidetector CT imaging of the pelvis was performed before and during bolus injection of intravenous contrast. Multiplanar CT angiographic image reconstructions including MIPs were generated to evaluate the vascular anatomy.  CONTRAST:  125mL OMNIPAQUE IOHEXOL 350 MG/ML SOLN  COMPARISON:  07/24/2014,  FINDINGS: There is good arterial enhancement without focal stenosis. Venous opacification is suboptimal. There is persistent expansion at the junction of the right common femoral vein and right external iliac vein with perivascular fat stranding.  There is no inguinal lymphadenopathy. There are small subcentimeter bilateral inguinal lymph nodes. There is no fluid collection.  The visualized small bowel and colon are normal. There is no pelvic free fluid. The bladder is normal. There is no acute osseous abnormality. There is no lytic or sclerotic osseous lesion. There is degenerative disc disease at L3-4, L4-5 and L5-S1.  Review of the MIP images confirms  the above findings.  IMPRESSION: 1. There is suboptimal venous opacification. There is persistent expansion at the junction of the right common femoral vein and right external iliac vein with surrounding inflammatory changes, which may reflect thrombophlebitis. Evaluation with a focused right lower extremity ultrasound in the region of the right groin is recommended.   Electronically Signed   By: Elige KoHetal  Patel   On: 07/25/2014 23:55   Ct Pelvis W Contrast  07/25/2014   CLINICAL DATA:  Right inguinal pain.  EXAM: CT PELVIS WITH CONTRAST  TECHNIQUE: Multidetector CT imaging of the pelvis was performed using the standard protocol following the bolus administration of intravenous contrast.  CONTRAST:  125mL OMNIPAQUE IOHEXOL 300 MG/ML  SOLN  COMPARISON:  Unenhanced CT performed earlier this evening  FINDINGS: A 65 second delay was used for contrast timing, intending to scanned during venous phase of the pelvis. Unfortunately, there is good opacification of the arteries but the veins are not opacified. There continues to be inflammatory -appearing stranding around the right common femoral vein and right external iliac vein. This may represent a thrombophlebitis but venous patency is not conclusive given the lack of opacification of any of the veins in the area.  A few small nodes are visible in the region, likely reactive.  IMPRESSION: Inflammatory-appearing stranding opacities around the right common femoral vein. This may represent thrombophlebitis. Unfortunately we did not achieve good venous opacification to assess the patency of the vein.   Electronically Signed   By: Rosey Bathaniel R Mitchell M.D.  On: 07/25/2014 00:00   Mr Hip Right W Wo Contrast  07/27/2014   CLINICAL DATA:  Right groin pain. Edematous/inflammatory process around the common femoral vessels on CT.  EXAM: MRI OF THE RIGHT HIP WITHOUT AND WITH CONTRAST  TECHNIQUE: Multiplanar, multisequence MR imaging was performed both before and after administration  of intravenous contrast.  CONTRAST:  20 mL MultiHance IV  COMPARISON:  CT 07/25/2014 and earlier studies  FINDINGS: Bones: Unremarkable  Articular cartilage and labrum  Articular cartilage:  Negative  Labrum:  Negative  Joint or bursal effusion  Joint effusion:  None  Bursae:  Negative  Muscles and tendons  Muscles and tendons:  Negative  Other findings  There is eccentric wall thickening in the distal common femoral artery, and more marked circumferential wall thickening in the proximal superficial femoral artery. There are surrounding edematous/inflammatory changes with circumferential perivascular enhancement after IV contrast administration. Signal in the proximal SFA suggesting intraluminal thrombus or plaque, with an indeterminate degree of luminal occlusion. Normal flow void in the adjacent common femoral vein.  IMPRESSION: 1. Negative right hip. 2. 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 3. Possible thrombus in the proximal SFA with indeterminate degree of occlusion. Correlate with ABIs and clinical symptomatology.   Electronically Signed   By: Oley Balm M.D.   On: 07/27/2014 13:04   Anti-infectives: Anti-infectives    Start     Dose/Rate Route Frequency Ordered Stop   08/01/14 1900  cefUROXime (ZINACEF) 1.5 g in dextrose 5 % 50 mL IVPB     1.5 g100 mL/hr over 30 Minutes Intravenous Every 12 hours 08/01/14 1731 08/02/14 0730   08/01/14 0600  ceFAZolin (ANCEF) 3 g in dextrose 5 % 50 mL IVPB    Comments:  Send with pt to OR   3 g160 mL/hr over 30 Minutes Intravenous On call 07/30/14 1503 08/01/14 1204   07/31/14 0600  ceFAZolin (ANCEF) IVPB 1 g/50 mL premix  Status:  Discontinued    Comments:  Send with pt to OR   1 g100 mL/hr over 30 Minutes Intravenous On call 07/30/14 1244 07/30/14 1435   07/31/14 0600  ceFAZolin (ANCEF) 3 g in dextrose 5 % 50 mL IVPB  Status:  Discontinued    Comments:  Send with pt to OR   3 g160 mL/hr over 30 Minutes  Intravenous On call 07/30/14 1435 07/30/14 1503      Assessment/Plan: s/p Procedure(s): THROMBECTOMY RIGHT LEG SFA, Anterior tibial, and perineal arteries with intraoperative arteriograms (Right) Stable postop day 1. Discussed findings with patient. Explained no clear identifiable cause other than potential hypercoagulable ability for his embolus. Feel that it is safe for him to be transferred to [facility]. Converting from heparin to full Coumadin anticoagulation.   LOS: 9 days   Belvia Gotschall 08/02/2014, 9:41 AM

## 2014-08-02 NOTE — Progress Notes (Addendum)
Received orders to transfer pt to 2[facility], pt aware of transfer. No s/s of acute distress noted. Awaiting bed assignment.  1005: report called to [employer] RN on [facility].

## 2014-08-02 NOTE — Progress Notes (Signed)
Pt foley was removed per protocol. After removal  a moderate amount of puss like discharged was seen draining from urethra. Pt was cleaned up and ambulated to the chair.

## 2014-08-03 LAB — CBC
HCT: 33.9 % — ABNORMAL LOW (ref 39.0–52.0)
HEMOGLOBIN: 10.4 g/dL — AB (ref 13.0–17.0)
MCH: 27.4 pg (ref 26.0–34.0)
MCHC: 30.7 g/dL (ref 30.0–36.0)
MCV: 89.2 fL (ref 78.0–100.0)
Platelets: 228 10*3/uL (ref 150–400)
RBC: 3.8 MIL/uL — ABNORMAL LOW (ref 4.22–5.81)
RDW: 15.1 % (ref 11.5–15.5)
WBC: 12.1 10*3/uL — ABNORMAL HIGH (ref 4.0–10.5)

## 2014-08-03 LAB — BASIC METABOLIC PANEL
Anion gap: 8 (ref 5–15)
BUN: 23 mg/dL (ref 6–23)
CO2: 26 mmol/L (ref 19–32)
Calcium: 8.1 mg/dL — ABNORMAL LOW (ref 8.4–10.5)
Chloride: 100 mmol/L (ref 96–112)
Creatinine, Ser: 1.67 mg/dL — ABNORMAL HIGH (ref 0.50–1.35)
GFR calc Af Amer: 54 mL/min — ABNORMAL LOW (ref >90)
GFR, EST NON AFRICAN AMERICAN: 47 mL/min — AB (ref >90)
Glucose, Bld: 124 mg/dL — ABNORMAL HIGH (ref 70–99)
POTASSIUM: 4.3 mmol/L (ref 3.5–5.1)
SODIUM: 134 mmol/L — AB (ref 135–145)

## 2014-08-03 LAB — PROTIME-INR
INR: 1.38 (ref 0.00–1.49)
Prothrombin Time: 17.1 seconds — ABNORMAL HIGH (ref 11.6–15.2)

## 2014-08-03 LAB — GLUCOSE, CAPILLARY: Glucose-Capillary: 105 mg/dL — ABNORMAL HIGH (ref 70–99)

## 2014-08-03 LAB — HEPARIN LEVEL (UNFRACTIONATED): Heparin Unfractionated: 1.1 IU/mL — ABNORMAL HIGH (ref 0.30–0.70)

## 2014-08-03 MED ORDER — COUMADIN BOOK
Freq: Once | Status: AC
  Administered 2014-08-03: 12:00:00
  Filled 2014-08-03: qty 1

## 2014-08-03 MED ORDER — HEPARIN (PORCINE) IN NACL 100-0.45 UNIT/ML-% IJ SOLN
1700.0000 [IU]/h | INTRAMUSCULAR | Status: DC
  Administered 2014-08-03: 1700 [IU]/h via INTRAVENOUS
  Filled 2014-08-03 (×2): qty 250

## 2014-08-03 MED ORDER — ENOXAPARIN SODIUM 150 MG/ML ~~LOC~~ SOLN
180.0000 mg | Freq: Two times a day (BID) | SUBCUTANEOUS | Status: DC
  Administered 2014-08-03 – 2014-08-04 (×2): 180 mg via SUBCUTANEOUS
  Filled 2014-08-03 (×3): qty 2

## 2014-08-03 MED ORDER — WARFARIN SODIUM 2.5 MG PO TABS
12.5000 mg | ORAL_TABLET | Freq: Once | ORAL | Status: AC
  Administered 2014-08-03: 12.5 mg via ORAL
  Filled 2014-08-03: qty 1

## 2014-08-03 MED ORDER — METOPROLOL TARTRATE 50 MG PO TABS
75.0000 mg | ORAL_TABLET | Freq: Two times a day (BID) | ORAL | Status: DC
  Administered 2014-08-03: 75 mg via ORAL
  Administered 2014-08-04: 25 mg via ORAL
  Administered 2014-08-04 – 2014-08-05 (×2): 75 mg via ORAL
  Filled 2014-08-03 (×5): qty 1

## 2014-08-03 NOTE — Progress Notes (Signed)
Small amount of drainage noted from incision site, serous. Incision site cleansed with betadine and dry dressing applied. Will continue to monitor.

## 2014-08-03 NOTE — Progress Notes (Signed)
Pt voided 350 ml. Post void bladder scan performed,  40 ml urine present. Md made aware. Will continue to monitor.

## 2014-08-03 NOTE — Progress Notes (Signed)
Subjective: Interval History: none.. Sitting up at bedside  Objective: Vital signs in last 24 hours: Temp:  [98.1 F (36.7 C)-99.3 F (37.4 C)] 98.9 F (37.2 C) (02/07 0415) Pulse Rate:  [72-77] 72 (02/07 0415) Resp:  [18-20] 18 (02/07 0415) BP: (96-118)/(65-80) 96/80 mmHg (02/07 0415) SpO2:  [92 %-99 %] 97 % (02/07 0415) Weight:  [382 lb 4.8 oz (173.41 kg)-384 lb 0.7 oz (174.2 kg)] 384 lb 0.7 oz (174.2 kg) (02/07 0415)  Intake/Output from previous day: 02/06 0701 - 02/07 0700 In: 480 [P.O.:480] Out: 625 [Urine:625] Intake/Output this shift:    Dressing intact. Right foot well-perfused. No numbness.  Lab Results:  Recent Labs  08/02/14 0146 08/03/14 0450  WBC 12.1* 12.1*  HGB 11.7* 10.4*  HCT 37.7* 33.9*  PLT 219 228   BMET  Recent Labs  08/02/14 0146 08/03/14 0450  NA 135 134*  K 4.1 4.3  CL 99 100  CO2 28 26  GLUCOSE 107* 124*  BUN 15 23  CREATININE 1.22 1.67*  CALCIUM 8.3* 8.1*    Studies/Results: Ct Abdomen Pelvis Wo Contrast  07/24/2014   CLINICAL DATA:  Right inguinal pain for 5 days.  EXAM: CT ABDOMEN AND PELVIS WITHOUT CONTRAST  TECHNIQUE: Multidetector CT imaging of the abdomen and pelvis was performed following the standard protocol without IV contrast.  COMPARISON:  None.  FINDINGS: No urinary calculi are evident. There is no hydronephrosis or ureteral dilatation. There is an upper pole right renal cyst measuring 3.3 cm, not characterized in the absence of intravenous contrast.  The appendix is normal.  There is inflammatory stranding surrounding the right common femoral artery and vein in the inguinal region, extending proximally around the right external iliac artery and vein and continuing all the way up to the level of the aortic bifurcation. This inflammatory stranding is not accompanied by a drainable fluid collection or soft tissue gas. No adenopathy is evident. There is no hernia. No other inflammatory changes are evident elsewhere in the  abdomen or pelvis.  There are grossly unremarkable unenhanced appearances of the liver, spleen, pancreas and adrenals. Mesentery and bowel appear unremarkable except for uncomplicated colonic diverticulosis. No significant abnormalities are evident in the lower chest.  IMPRESSION: 1. Inflammatory-appearing stranding around the right common femoral artery and vein in the inguinal region, extending proximally up to the level of the aortic bifurcation. This might represent a thrombophlebitis. 2. No evidence of an inguinal hernia, adenopathy or mass. 3. No urinary calculi. No hydronephrosis or ureteral dilatation. Normal appendix.   Electronically Signed   By: Ellery Plunk M.D.   On: 07/24/2014 22:01   Ct Angio Chest Pe W/cm &/or Wo Cm  07/30/2014   CLINICAL DATA:  Shortness of breath with exertion. Groin pain. Thrombus of pulmonary vein.  EXAM: CT ANGIOGRAPHY CHEST WITH CONTRAST  TECHNIQUE: Multidetector CT imaging of the chest was performed using the standard protocol during bolus administration of intravenous contrast. Multiplanar CT image reconstructions and MIPs were obtained to evaluate the vascular anatomy.  CONTRAST:  OMNIPAQUE IOHEXOL 350 MG/ML SOLN  COMPARISON:  None.  FINDINGS: Significant patient motion artifact, suboptimal contrast bolus timing, and soft tissue attenuation from body habitus limits evaluation. There are no filling defects within the main or lobar branches. The subsegmental branches are not well assessed.  There is multi chamber cardiomegaly. There is likely mild aneurysmal dilatation of the mid ascending aorta, measuring 3.8- 4.0 cm, however accurate caliber placement is difficult due to motion through this region. There  is no pleural or pericardial effusion. There is no mediastinal or hilar adenopathy.  Right and left lower lobe linear atelectasis. No consolidation to suggest pneumonia.  No acute abnormality in the included upper abdomen. There is degenerative change in the mid  lower thoracic spine.  Review of the MIP images confirms the above findings.  IMPRESSION: 1. Limited assessment demonstrates no central pulmonary embolus. Please note the segmental in subsegmental branches are are not well assessed. 2. Cardiomegaly. Suspect mild aneurysmal dilatation of the ascending aorta, up to 4 cm. Recommend annual imaging followup by CTA or MRA. This recommendation follows 2010 ACCF/AHA/AATS/ACR/ASA/SCA/SCAI/SIR/STS/SVM Guidelines for the Diagnosis and Management of Patients with Thoracic Aortic Disease. Circulation. 2010; 121: [id number]   Electronically Signed   By: Rubye Oaks M.D.   On: 07/30/2014 01:45   Ct Angio Pelvis W/cm &/or Wo/cm  07/25/2014   CLINICAL DATA:  Right groin pain.  EXAM: CT ANGIOGRAPHY OF PELVIS  TECHNIQUE: Multidetector CT imaging of the pelvis was performed before and during bolus injection of intravenous contrast. Multiplanar CT angiographic image reconstructions including MIPs were generated to evaluate the vascular anatomy.  CONTRAST:  OMNIPAQUE IOHEXOL 350 MG/ML SOLN  COMPARISON:  07/24/2014,  FINDINGS: There is good arterial enhancement without focal stenosis. Venous opacification is suboptimal. There is persistent expansion at the junction of the right common femoral vein and right external iliac vein with perivascular fat stranding.  There is no inguinal lymphadenopathy. There are small subcentimeter bilateral inguinal lymph nodes. There is no fluid collection.  The visualized small bowel and colon are normal. There is no pelvic free fluid. The bladder is normal. There is no acute osseous abnormality. There is no lytic or sclerotic osseous lesion. There is degenerative disc disease at L3-4, L4-5 and L5-S1.  Review of the MIP images confirms the above findings.  IMPRESSION: 1. There is suboptimal venous opacification. There is persistent expansion at the junction of the right common femoral vein and right external iliac vein with surrounding  inflammatory changes, which may reflect thrombophlebitis. Evaluation with a focused right lower extremity ultrasound in the region of the right groin is recommended.   Electronically Signed   By: Elige Ko   On: 07/25/2014 23:55   Ct Pelvis W Contrast  07/25/2014   CLINICAL DATA:  Right inguinal pain.  EXAM: CT PELVIS WITH CONTRAST  TECHNIQUE: Multidetector CT imaging of the pelvis was performed using the standard protocol following the bolus administration of intravenous contrast.  CONTRAST:  OMNIPAQUE IOHEXOL 300 MG/ML  SOLN  COMPARISON:  Unenhanced CT performed earlier this evening  FINDINGS: A 65 second delay was used for contrast timing, intending to scanned during venous phase of the pelvis. Unfortunately, there is good opacification of the arteries but the veins are not opacified. There continues to be inflammatory -appearing stranding around the right common femoral vein and right external iliac vein. This may represent a thrombophlebitis but venous patency is not conclusive given the lack of opacification of any of the veins in the area.  A few small nodes are visible in the region, likely reactive.  IMPRESSION: Inflammatory-appearing stranding opacities around the right common femoral vein. This may represent thrombophlebitis. Unfortunately we did not achieve good venous opacification to assess the patency of the vein.   Electronically Signed   By: Ellery Plunk M.D.   On: 07/25/2014 00:00   Mr Hip Right W Wo Contrast  07/27/2014   CLINICAL DATA:  Right groin pain. Edematous/inflammatory process around the common  femoral vessels on CT.  EXAM: MRI OF THE RIGHT HIP WITHOUT AND WITH CONTRAST  TECHNIQUE: Multiplanar, multisequence MR imaging was performed both before and after administration of intravenous contrast.  CONTRAST:  20 mL MultiHance IV  COMPARISON:  CT 07/25/2014 and earlier studies  FINDINGS: Bones: Unremarkable  Articular cartilage and labrum  Articular cartilage:  Negative   Labrum:  Negative  Joint or bursal effusion  Joint effusion:  None  Bursae:  Negative  Muscles and tendons  Muscles and tendons:  Negative  Other findings  There is eccentric wall thickening in the distal common femoral artery, and more marked circumferential wall thickening in the proximal superficial femoral artery. There are surrounding edematous/inflammatory changes with circumferential perivascular enhancement after IV contrast administration. Signal in the proximal SFA suggesting intraluminal thrombus or plaque, with an indeterminate degree of luminal occlusion. Normal flow void in the adjacent common femoral vein.  IMPRESSION: 1. Negative right hip. 2. 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 3. Possible thrombus in the proximal SFA with indeterminate degree of occlusion. Correlate with ABIs and clinical symptomatology.   Electronically Signed   By: Oley Balmaniel  Hassell M.D.   On: 07/27/2014 13:04   Anti-infectives: Anti-infectives    Start     Dose/Rate Route Frequency Ordered Stop   08/01/14 1900  cefUROXime (ZINACEF) 1.5 g in dextrose 5 % 50 mL IVPB     1.5 g100 mL/hr over 30 Minutes Intravenous Every 12 hours 08/01/14 1731 08/02/14 0730   08/01/14 0600  ceFAZolin (ANCEF) 3 g in dextrose 5 % 50 mL IVPB    Comments:  Send with pt to OR   3 g160 mL/hr over 30 Minutes Intravenous On call 07/30/14 1503 08/01/14 1204   07/31/14 0600  ceFAZolin (ANCEF) IVPB 1 g/50 mL premix  Status:  Discontinued    Comments:  Send with pt to OR   1 g100 mL/hr over 30 Minutes Intravenous On call 07/30/14 1244 07/30/14 1435   07/31/14 0600  ceFAZolin (ANCEF) 3 g in dextrose 5 % 50 mL IVPB  Status:  Discontinued    Comments:  Send with pt to OR   3 g160 mL/hr over 30 Minutes Intravenous On call 07/30/14 1435 07/30/14 1503      Assessment/Plan: s/p Procedure(s): THROMBECTOMY RIGHT LEG SFA, Anterior tibial, and perineal arteries with intraoperative arteriograms  (Right) Continue to mobilize.   LOS: 10 days   EARLY, TODD 08/03/2014, 9:40 AM

## 2014-08-03 NOTE — Progress Notes (Addendum)
[facility] PROGRESS NOTE  Rickey Calderon ZOX:[id number] DOB: 1966/02/01 DOA: 07/24/2014 PCP: No PCP Per Patient, recently moved to [place] from [place]   Brief narrative: 49 y/o obese male with OSA on CPAP,HTN, gout, and chr prostatitis admitted with rt groin pain and claudication. Pt transferred from [facility] to [facility] on 2/1 for Abdominal aortogram. 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 - Patient had right lower extremity angiogram with thrombectomy of right superficial femoral, popliteal, anterior tibial and peroneal artery, with above knee right popliteal artery exposure, findings were concerning for acute embolus, so patient was started on anticoagulation with heparin drip, being started on warfarin as well.   Assessment/Plan:  Rt groin pain with claudication/ acute thrombotic event of right lower extremity  - placed on IV heparin initially on admission. vascular following underwent abdominal aortogram showing patent renal, bilateral common iliac, ext iliac and b/l hypogastric arteries.  occlusion of rt superficial femoral artery noted.  -Occluded DP on right. - 2-D echo showing EF 60%, CT chest was PE protocol, though aorta was not well visualized, but no obvious source of thrombus. -Patient had right lower extremity angiogram with thrombectomy of right superficial femoral, popliteal, anterior tibial and peroneal artery, with above knee right popliteal artery exposure. - findings were concerning for acute embolus, so patient was started on anticoagulation with heparin drip, currently  bridging with Lovenox, till INR is therapeutic on warfarin. Will need to check anti-Xa level after third dose of Lovenox to ensure therapeutic dosing given body weight. - Will need hypercoagulable workup as an outpatient.  Acute renal failure - Creatinine of 1.67 today, etiology is unclear, possible retention as having 400 mL output voiding urine once. - We'll check bladder scan, - contast induced ?? as patient received IV contrast recently with his CT chest and with angiogram.  HTN Blood pressure appears to be on the lower side, so will discontinue amlodipine, will discontinue lisinopril given acute renal failure, and will decrease metoprolol to 75 mg oral twice a day.  Gout  continue allopurinol  OSA  continue bedtime CPAP  Obesity  counseled on weight loss and exercise.    DVT prophylaxis: On full anticoagulation  Diet: regular  Code Status: full Family Communication: none at bedside Disposition Plan: home when INR is therapeutic.   Consultants:  Vascular sx Dr. Myra Gianotti  Procedures:  Abdominal aortogram 2/1  CTangio abd and pelvis 1/29   MRI hip1/31  Antibiotics:  none  HPI/Subjective: Seen and examined . Reports mild rt groin pain and occasional claudication.   Objective: Filed Vitals:   08/03/14 1009  BP: 108/66  Pulse:   Temp:   Resp:     Intake/Output Summary (Last 24 hours) at 08/03/14 1157 Last data filed at 08/03/14 0400  Gross per 24 hour  Intake    480 ml  Output    625 ml  Net   -145 ml   Filed Weights   08/01/14 1726 08/02/14 1446 08/03/14 0415  Weight: 172.1 kg (379 lb 6.6 oz) 173.41 kg (382 lb 4.8 oz) 174.2 kg (384 lb 0.7 oz)    Exam:   General:  Middle aged obese male in NAD   HEENT: no pallor, moist mucosa, supple neck  Chest: clear b/l, no added sounds  CVS: NS1&S2, no murmurs  GI: soft, NT, ND BS+  Ext: warm, no edema, incision at right upper thigh looks clean no oozing or discharge.  CNS: alert and oriented.      Data Reviewed: Basic Metabolic Panel:  Recent Labs Lab 07/28/14 0514 07/28/14 1500 07/31/14 0421 08/01/14 0508 08/02/14 0146 08/03/14 0450  NA 140  --  138 140 135 134*  K 4.1  --  4.0 4.2 4.1 4.3  CL 101  --  100 102 99 100  CO2 32  --  31 29 28 26   GLUCOSE 113*  --  100* 95 107* 124*  BUN 14  --  15 19 15 23   CREATININE 1.12 1.17 1.23 1.25 1.22 1.67*  CALCIUM 8.8  --  8.8 8.8 8.3* 8.1*   Liver Function Tests: No results for input(s): AST, ALT, ALKPHOS, BILITOT, PROT, ALBUMIN in the last 168 hours. No results for input(s): LIPASE, AMYLASE in the last 168 hours. No results for input(s): AMMONIA in the last 168 hours. CBC:  Recent Labs Lab 07/30/14 0310 07/31/14 0421 08/01/14 0508 08/02/14 0146 08/03/14 0450  WBC 8.9 10.5 9.7 12.1* 12.1*  HGB 11.8* 11.7* 11.6* 11.7* 10.4*  HCT 38.2* 37.9* 37.5* 37.7* 33.9*  MCV 88.0 87.7 88.4 88.7 89.2  PLT 263 261 260 219 228   Cardiac Enzymes: No results for input(s): CKTOTAL, CKMB, CKMBINDEX, TROPONINI in the last 168 hours. BNP (last 3 results) No results for input(s): BNP in the last 8760 hours.  ProBNP (last 3 results) No results for input(s): PROBNP in the last 8760 hours.  CBG:  Recent Labs Lab 07/30/14 0548 07/31/14 0613 08/01/14 0611 08/02/14 0743 08/03/14 0610  GLUCAP 111* 100* 124* 117* 105*    Recent Results (from the past 240 hour(s))  Culture, blood (routine x 2)     Status: None   Collection Time: 07/25/14  1:19 AM  Result Value Ref Range Status   Specimen Description BLOOD RIGHT HAND  Final   Special Requests BOTTLES DRAWN AEROBIC ONLY 3CC  Final   Culture   Final    NO GROWTH 5 DAYS Performed at [facility]    Report Status 07/31/2014 FINAL  Final  Culture, blood (routine x 2)     Status: None   Collection Time: 07/25/14  1:54 AM  Result Value Ref Range Status   Specimen Description BLOOD RIGHT ARM  Final   Special Requests BOTTLES DRAWN AEROBIC ONLY 10CC  Final   Culture    Final    NO GROWTH 5 DAYS Performed at [facility]    Report Status 07/31/2014 FINAL  Final  Urine culture     Status: None   Collection Time: 07/25/14  8:25 AM  Result Value Ref Range Status   Specimen Description URINE, CLEAN CATCH  Final   Special Requests NONE  Final   Colony Count   Final    6,000 COLONIES/ML Performed at [facility]    Culture   Final    INSIGNIFICANT GROWTH Performed at [facility]    Report Status 07/26/2014 FINAL  Final  MRSA PCR Screening     Status: None   Collection Time: 07/31/14  8:37 PM  Result Value Ref Range Status   MRSA by PCR NEGATIVE NEGATIVE Final    Comment:  The GeneXpert MRSA Assay (FDA approved for NASAL specimens only), is one component of a comprehensive MRSA colonization surveillance program. It is not intended to diagnose MRSA infection nor to guide or monitor treatment for MRSA infections.      Studies: No results found.  Scheduled Meds: . allopurinol  300 mg Oral Daily  . amLODipine  10 mg Oral Daily  . aspirin  81 mg Oral Daily  . coumadin book   Does not apply Once  . docusate sodium  100 mg Oral Daily  . doxazosin  4 mg Oral Daily  . enoxaparin (LOVENOX) injection  180 mg Subcutaneous Q12H  . fluticasone  1 spray Each Nare Daily  . lisinopril  40 mg Oral Daily  . loratadine  10 mg Oral Daily  . metoprolol  100 mg Oral BID  . mometasone-formoterol  2 puff Inhalation BID  . multivitamin with minerals  1 tablet Oral Daily  . pantoprazole  40 mg Oral Daily  . sertraline  200 mg Oral Daily  . warfarin  12.5 mg Oral ONCE-1800  . Warfarin - Pharmacist Dosing Inpatient   Does not apply q1800   Continuous Infusions: . sodium chloride 75 mL/hr at 08/03/14 1014     Time spent: 25 minutes    [facility], Krish Bailly  [facility] Pager [phone] If 7PM-7AM, please contact night-coverage at [email or web address], password [facility] 08/03/2014, 11:57 AM  LOS: 10 days

## 2014-08-03 NOTE — Progress Notes (Signed)
ANTICOAGULATION CONSULT NOTE - Follow Up Consult  Pharmacy Consult for Heparin  Indication: s/p thrombectomy   No Known Allergies  Patient Measurements: Height: 5\' 7"  (170.2 cm) Weight: (!) 384 lb 0.7 oz (174.2 kg) IBW/kg (Calculated) : 66.1  Vital Signs: Temp: 98.9 F (37.2 C) (02/07 0415) Temp Source: Oral (02/07 0415) BP: 96/80 mmHg (02/07 0415) Pulse Rate: 72 (02/07 0415)  Labs:  Recent Labs  08/01/14 0508  08/02/14 0146 08/02/14 1040 08/02/14 1650 08/03/14 0450  HGB 11.6*  --  11.7*  --   --  10.4*  HCT 37.5*  --  37.7*  --   --  33.9*  PLT 260  --  219  --   --  228  LABPROT  --   --  15.1  --   --  17.1*  INR  --   --  1.18  --   --  1.38  HEPARINUNFRC  --   < > 0.25* 0.52 0.66 1.10*  CREATININE 1.25  --  1.22  --   --  1.67*  < > = values in this interval not displayed.  Estimated Creatinine Clearance: 82.7 mL/min (by C-G formula based on Cr of 1.67).   Assessment: Supra-therapeutic heparin level, has been trending up on same rate, other labs as above. No issues per RN.   Goal of Therapy:  Heparin level 0.3-0.7 units/ml Monitor platelets by anticoagulation protocol: Yes   Plan:  -Hold heparin x 1 hour -Re-start heparin at 1700 units/hr at 0700 -1400 HL -Daily CBC/HL -Monitor for bleeding  Abran DukeLedford, Kratos Ruscitti 08/03/2014,5:56 AM

## 2014-08-03 NOTE — Progress Notes (Signed)
Bladder scan completed, 280 ml present. Pt unable to void at this time. MD made aware. Orders given to bladder scan in 2 hours. Will continue to monitor.

## 2014-08-03 NOTE — Progress Notes (Addendum)
ANTICOAGULATION CONSULT NOTE - Follow Up Consult  Pharmacy Consult for Heparin/Coumadin Indication: s/p thrombectomy  of leg thrombus  No Known Allergies  Patient Measurements: Height: 5\' 7"  (170.2 cm) Weight: (!) 384 lb 0.7 oz (174.2 kg) IBW/kg (Calculated) : 66.1  Vital Signs: Temp: 98.9 F (37.2 C) (02/07 0415) Temp Source: Oral (02/07 0415) BP: 108/66 mmHg (02/07 1009) Pulse Rate: 72 (02/07 0415)  Labs:  Recent Labs  08/01/14 0508  08/02/14 0146 08/02/14 1040 08/02/14 1650 08/03/14 0450  HGB 11.6*  --  11.7*  --   --  10.4*  HCT 37.5*  --  37.7*  --   --  33.9*  PLT 260  --  219  --   --  228  LABPROT  --   --  15.1  --   --  17.1*  INR  --   --  1.18  --   --  1.38  HEPARINUNFRC  --   < > 0.25* 0.52 0.66 1.10*  CREATININE 1.25  --  1.22  --   --  1.67*  < > = values in this interval not displayed.  Estimated Creatinine Clearance: 82.7 mL/min (by C-G formula based on Cr of 1.67).   . sodium chloride 75 mL/hr at 08/03/14 1014      Assessment: 49 yo male on IV heparin for thrombosis in R femoral vessel s/p thrombectomy.   INR beginning to rise after 2 days of Coumadin 10 mg (1.38).  Heparin level elevated this AM, and rate reduced.  Pharmacy asked to transition to Lovenox today. No bleeding or complications noted.  Goal of Therapy:  Anti-Xa level 0.6-1 four hours after Lovenox dose Monitor platelets by anticoagulation protocol: Yes  INR 2-3   Plan:  -D/c heparin, start Lovenox 180 mg BID q 12 hrs.  Give first dose 1 hr after heparin has been turned off. -CBC q 72 hrs while on Lovenox. -Will need to check and anti-Xa level after the 3rd dose of Lovenox to ensure therapeutic dosing given body weight. -Coumadin 12.5 mg x 1 tonight. -Continue daily PT/INR.  Tad MooreJessica Tammie Ellsworth, Pharm D, BCPS  Clinical Pharmacist Pager [phone]  08/03/2014 11:50 AM

## 2014-08-03 NOTE — Progress Notes (Signed)
Pt states he can place himself on Bipap when ready per home use. Bipap auto set-up at bedside with FFM. I told him to call if he needs further assistance

## 2014-08-04 ENCOUNTER — Encounter (HOSPITAL_COMMUNITY): Payer: Self-pay | Admitting: Surgery

## 2014-08-04 DIAGNOSIS — N179 Acute kidney failure, unspecified: Secondary | ICD-10-CM

## 2014-08-04 DIAGNOSIS — Z9889 Other specified postprocedural states: Secondary | ICD-10-CM

## 2014-08-04 DIAGNOSIS — M79609 Pain in unspecified limb: Secondary | ICD-10-CM

## 2014-08-04 LAB — BASIC METABOLIC PANEL
Anion gap: 7 (ref 5–15)
BUN: 24 mg/dL — ABNORMAL HIGH (ref 6–23)
CALCIUM: 8.5 mg/dL (ref 8.4–10.5)
CO2: 27 mmol/L (ref 19–32)
Chloride: 103 mmol/L (ref 96–112)
Creatinine, Ser: 1.77 mg/dL — ABNORMAL HIGH (ref 0.50–1.35)
GFR calc Af Amer: 50 mL/min — ABNORMAL LOW (ref >90)
GFR, EST NON AFRICAN AMERICAN: 43 mL/min — AB (ref >90)
Glucose, Bld: 96 mg/dL (ref 70–99)
POTASSIUM: 4.3 mmol/L (ref 3.5–5.1)
SODIUM: 137 mmol/L (ref 135–145)

## 2014-08-04 LAB — CBC
HCT: 32.2 % — ABNORMAL LOW (ref 39.0–52.0)
HCT: 33.6 % — ABNORMAL LOW (ref 39.0–52.0)
Hemoglobin: 9.9 g/dL — ABNORMAL LOW (ref 13.0–17.0)
Hemoglobin: 10.2 g/dL — ABNORMAL LOW (ref 13.0–17.0)
MCH: 27.2 pg (ref 26.0–34.0)
MCH: 27.7 pg (ref 26.0–34.0)
MCHC: 30.7 g/dL (ref 30.0–36.0)
MCHC: 30.4 g/dL (ref 30.0–36.0)
MCV: 88.5 fL (ref 78.0–100.0)
MCV: 91.3 fL (ref 78.0–100.0)
PLATELETS: 243 10*3/uL (ref 150–400)
Platelets: 254 10*3/uL (ref 150–400)
RBC: 3.64 MIL/uL — ABNORMAL LOW (ref 4.22–5.81)
RBC: 3.68 MIL/uL — AB (ref 4.22–5.81)
RDW: 15 % (ref 11.5–15.5)
RDW: 15.1 % (ref 11.5–15.5)
WBC: 11.5 10*3/uL — ABNORMAL HIGH (ref 4.0–10.5)
WBC: 11.4 10*3/uL — AB (ref 4.0–10.5)

## 2014-08-04 LAB — URINALYSIS, ROUTINE W REFLEX MICROSCOPIC
Bilirubin Urine: NEGATIVE
Glucose, UA: NEGATIVE mg/dL
Hgb urine dipstick: NEGATIVE
Ketones, ur: NEGATIVE mg/dL
LEUKOCYTES UA: NEGATIVE
Nitrite: NEGATIVE
PROTEIN: NEGATIVE mg/dL
Specific Gravity, Urine: 1.01 (ref 1.005–1.030)
Urobilinogen, UA: 0.2 mg/dL (ref 0.0–1.0)
pH: 5 (ref 5.0–8.0)

## 2014-08-04 LAB — PROTIME-INR
INR: 1.59 — ABNORMAL HIGH (ref 0.00–1.49)
PROTHROMBIN TIME: 19.1 s — AB (ref 11.6–15.2)

## 2014-08-04 LAB — GLUCOSE, CAPILLARY: GLUCOSE-CAPILLARY: 95 mg/dL (ref 70–99)

## 2014-08-04 LAB — HEPARIN ANTI-XA: Heparin LMW: 1.25 IU/mL

## 2014-08-04 LAB — SODIUM, URINE, RANDOM: Sodium, Ur: 12 mmol/L

## 2014-08-04 MED ORDER — TAMSULOSIN HCL 0.4 MG PO CAPS
0.4000 mg | ORAL_CAPSULE | Freq: Every day | ORAL | Status: DC
  Administered 2014-08-04: 0.4 mg via ORAL
  Filled 2014-08-04 (×2): qty 1

## 2014-08-04 MED ORDER — ENOXAPARIN SODIUM 150 MG/ML ~~LOC~~ SOLN
150.0000 mg | Freq: Two times a day (BID) | SUBCUTANEOUS | Status: DC
  Administered 2014-08-05: 150 mg via SUBCUTANEOUS
  Filled 2014-08-04 (×3): qty 1

## 2014-08-04 MED ORDER — SODIUM CHLORIDE 0.9 % IV SOLN
INTRAVENOUS | Status: DC
  Administered 2014-08-04: 10:00:00 via INTRAVENOUS

## 2014-08-04 MED ORDER — GUAIFENESIN ER 600 MG PO TB12
600.0000 mg | ORAL_TABLET | Freq: Two times a day (BID) | ORAL | Status: DC
  Administered 2014-08-04 – 2014-08-05 (×3): 600 mg via ORAL
  Filled 2014-08-04 (×5): qty 1

## 2014-08-04 MED ORDER — WARFARIN SODIUM 2.5 MG PO TABS
12.5000 mg | ORAL_TABLET | Freq: Once | ORAL | Status: AC
  Administered 2014-08-04: 12.5 mg via ORAL
  Filled 2014-08-04: qty 1

## 2014-08-04 MED ORDER — ENOXAPARIN SODIUM 150 MG/ML ~~LOC~~ SOLN
180.0000 mg | Freq: Two times a day (BID) | SUBCUTANEOUS | Status: DC
  Administered 2014-08-04: 180 mg via SUBCUTANEOUS
  Filled 2014-08-04 (×3): qty 2

## 2014-08-04 NOTE — Evaluation (Signed)
Occupational Therapy Evaluation Patient Details Name: Rickey Calderon MRN: [id number] DOB: 1965-12-12 Today's Date: 08/04/2014    History of Present Illness Pt is 49 y/o male admitted for s/p thrombectomy  Superficial femoral, popliteal, anterior tibial, peroneal artery.      Clinical Impression   Patient independent PTA. Patient currently requires up to total assist for LB ADLs and min guard for functional mobility/transfers. Patient will benefit from acute OT to increase overall independence in the areas of ADLs, functional mobility, overall safety in order to safely discharge to his hotel room.     Follow Up Recommendations  No OT follow up;Supervision - Intermittent    Equipment Recommendations  3 in 1 bedside comode (bariatric BSC)    Recommendations for Other Services  None at this time     Precautions / Restrictions Precautions Precautions: None Restrictions Weight Bearing Restrictions: No      Mobility Bed Mobility   General bed mobility comments: Patient seated EOB upon therapist entering room  Transfers Overall transfer level: Needs assistance   Transfers: Sit to/from Stand Sit to Stand: Min guard         General transfer comment: Min guard for balance    Balance Overall balance assessment: Needs assistance Sitting-balance support: Feet supported;No upper extremity supported Sitting balance-Leahy Scale: Good     Standing balance support: No upper extremity supported;During functional activity Standing balance-Leahy Scale: Fair     ADL Overall ADL's : Needs assistance/impaired Eating/Feeding: Independent   Grooming: Supervision/safety;Standing   Upper Body Bathing: Set up;Sitting   Lower Body Bathing: Total assistance;Sit to/from stand   Upper Body Dressing : Set up;Sitting   Lower Body Dressing: Total assistance;Sit to/from stand   Toilet Transfer: Min Medical sales representative;Ambulation           Functional mobility during ADLs:  Min guard;Cueing for safety General ADL Comments: Patient unable to cross OR reach down to BLEs for LB ADLs. Patient with an abnormal gait, increasingly lateral stance and unable to keep center of mass to formulate normal gait pattern. No RW needed. Patient will benefit from acute OT to increase independence with LB ADLs and promote safe discharge transition               Pertinent Vitals/Pain Pain Assessment: No/denies pain     Hand Dominance Right   Extremity/Trunk Assessment Upper Extremity Assessment Upper Extremity Assessment: Overall WFL for tasks assessed   Lower Extremity Assessment Lower Extremity Assessment: Defer to PT evaluation   Cervical / Trunk Assessment Cervical / Trunk Assessment: Normal   Communication Communication Communication: No difficulties   Cognition Arousal/Alertness: Awake/alert Behavior During Therapy: WFL for tasks assessed/performed Overall Cognitive Status: Within Functional Limits for tasks assessed             Home Living Family/patient expects to be discharged to:: Other (Comment)   Additional Comments: Patient reports he is currently staying at a hotel and "inbetween places"      Prior Functioning/Environment Level of Independence: Independent      OT Diagnosis: Generalized weakness;Acute pain   OT Problem List: Decreased range of motion;Decreased activity tolerance;Impaired balance (sitting and/or standing);Decreased knowledge of use of DME or AE;Pain   OT Treatment/Interventions: Self-care/ADL training;Therapeutic exercise;Energy conservation;DME and/or AE instruction;Therapeutic activities;Balance training;Patient/family education    OT Goals(Current goals can be found in the care plan section) Acute Rehab OT Goals OT Goal Formulation: With patient Time For Goal Achievement: 08/11/14 Potential to Achieve Goals: Good  OT Frequency: Min 2X/week   Barriers  to D/C: Decreased caregiver support          End of Session  Equipment Utilized During Treatment: Gait belt  Activity Tolerance: Patient tolerated treatment well Patient left: in bed;with call bell/phone within reach   Time: 0822-0837 OT Time Calculation (min): 15 min Charges:  OT Evaluation $Initial OT Evaluation Tier I: 1 Procedure  Eben Choinski , MS, OTR/L, CLT Pager: [phone]  08/04/2014, 8:49 AM

## 2014-08-04 NOTE — Progress Notes (Signed)
Utilization review completed.  

## 2014-08-04 NOTE — Progress Notes (Signed)
Physical Therapy Treatment Patient Details Name: Rickey MazeLennard Woldt MRN: [id number] DOB: 1966-04-03 Today's Date: 08/04/2014    History of Present Illness Pt is 49 y/o male admitted for s/p thrombectomy  Superficial femoral, popliteal, anterior tibial, peroneal artery.       PT Comments    Making good progress with prgressive amb, not needing RW, but noted gait less efficient without bilateral UE support  Follow Up Recommendations  No PT follow up     Equipment Recommendations  Rolling walker with 5" wheels (bariatric)    Recommendations for Other Services       Precautions / Restrictions Precautions Precautions: None Restrictions Weight Bearing Restrictions: No    Mobility  Bed Mobility               General bed mobility comments: Patient seated EOB upon therapist entering room  Transfers Overall transfer level: Needs assistance Equipment used: None Transfers: Sit to/from Stand Sit to Stand: Min guard         General transfer comment: Min guard for balance; took time to "prepare" to get up (likely anticipation of pain); dependent on momentum from bed at lowest position  Ambulation/Gait Ambulation/Gait assistance: Min guard;Supervision Ambulation Distance (Feet): 150 Feet Assistive device: None (and pushing IV pole)   Gait velocity: decrease   General Gait Details: Noted increased lateral translation of center of mass over stance leg to be able to unweigh swing LE, due to increased step width; this leads to decreased efficiency of gait; no gross loss of balance noted   Stairs            Wheelchair Mobility    Modified Rankin (Stroke Patients Only)       Balance Overall balance assessment: Needs assistance Sitting-balance support: Feet supported;No upper extremity supported Sitting balance-Leahy Scale: Good     Standing balance support: No upper extremity supported;During functional activity Standing balance-Leahy Scale: Fair                       Cognition Arousal/Alertness: Awake/alert Behavior During Therapy: WFL for tasks assessed/performed Overall Cognitive Status: Within Functional Limits for tasks assessed                      Exercises      General Comments        Pertinent Vitals/Pain Pain Assessment: 0-10 Pain Score: 4  Pain Location: RLE Pain Descriptors / Indicators: Aching Pain Intervention(s): Monitored during session;Patient requesting pain meds-RN notified    Home Living Family/patient expects to be discharged to:: Other (Comment)               Additional Comments: Patient reports he is currently staying at a hotel and "inbetween places"    Prior Function Level of Independence: Independent          PT Goals (current goals can now be found in the care plan section) Acute Rehab PT Goals Patient Stated Goal: To go home PT Goal Formulation: With patient Time For Goal Achievement: 08/09/14 Potential to Achieve Goals: Good Progress towards PT goals: Progressing toward goals    Frequency  Min 3X/week    PT Plan Current plan remains appropriate    Co-evaluation             End of Session   Activity Tolerance: Patient tolerated treatment well;Patient limited by pain Patient left: in chair;with call bell/phone within reach     Time: 1022-1044 PT Time Calculation (min) (ACUTE ONLY):  22 min  Charges:  $Gait Training: 8-22 mins                    G Codes:      Van Clines Hamff 08/04/2014, 11:24 AM  Van Clines, PT  Acute Rehabilitation Services Pager [phone] Office [phone]

## 2014-08-04 NOTE — Progress Notes (Signed)
  Progress Note    08/04/2014 7:46 AM 3 Days Post-Op  Subjective:  States he is still a little sore  Afebrile HR 70's-90's 100's-110's systolic 98% RA  Filed Vitals:   08/04/14 0530  BP: 116/83  Pulse: 70  Temp: 98.1 F (36.7 C)  Resp: 18    Physical Exam: Lungs:  Non labored Incisions:  C/d/i-there was mild serous drainage on the bandage;  i could not express any fluid from his wound Extremities:  Right foot is warm with +doppler signals in right DP/PT   CBC    Component Value Date/Time   WBC 11.4* 08/04/2014 0540   RBC 3.68* 08/04/2014 0540   HGB 10.2* 08/04/2014 0540   HCT 33.6* 08/04/2014 0540   PLT 243 08/04/2014 0540   MCV 91.3 08/04/2014 0540   MCH 27.7 08/04/2014 0540   MCHC 30.4 08/04/2014 0540   RDW 15.1 08/04/2014 0540   LYMPHSABS 1.7 07/24/2014 2110   MONOABS 0.8 07/24/2014 2110   EOSABS 0.3 07/24/2014 2110   BASOSABS 0.0 07/24/2014 2110    BMET    Component Value Date/Time   NA 137 08/04/2014 0540   K 4.3 08/04/2014 0540   CL 103 08/04/2014 0540   CO2 27 08/04/2014 0540   GLUCOSE 96 08/04/2014 0540   BUN 24* 08/04/2014 0540   CREATININE 1.77* 08/04/2014 0540   CALCIUM 8.5 08/04/2014 0540   GFRNONAA 43* 08/04/2014 0540   GFRAA 50* 08/04/2014 0540    INR    Component Value Date/Time   INR 1.59* 08/04/2014 0540     Intake/Output Summary (Last 24 hours) at 08/04/14 0746 Last data filed at 08/04/14 0000  Gross per 24 hour  Intake    480 ml  Output   4300 ml  Net  -3820 ml     Assessment:  49 y.o. male is s/p:  #1: Thrombectomy, right superficial femoral, popliteal, anterior tibial, peroneal artery #2: Above knee right popliteal artery exposure #3: Right lower extremity angiogram was selected images of the anterior tibial and peroneal artery with first order catheter placement  3 Days Post-Op  Plan: -pt with + doppler signal in the right DP and PT -DVT prophylaxis:  Lovenox/Coumadin bridge-INR slowly rising -continue  coumadin per pharmacy   Doreatha MassedSamantha Ernesto Zukowski, PA-C [facility] [phone] 08/04/2014 7:46 AM

## 2014-08-04 NOTE — Progress Notes (Signed)
VASCULAR LAB PRELIMINARY  ARTERIAL  ABI completed:    RIGHT    LEFT    PRESSURE WAVEFORM  PRESSURE WAVEFORM  RT BRACHIAL 127 triphasic LT BRACHIAL 124 triphasic  DP 95 triphasic DP 145 triphasic  PT 92 triphasic PT 130 triphasic           RIGHT LEFT  ABI 0.75 1.14   RT ABI 0.75 suggests mild to nearly moderate reduction of arterial flow. LT ABI 1.14  Suggests arterial flow within normal limits.  Redmond Pullingltzroth, Chinyere Galiano F, RVT 08/04/2014, 5:25 PM

## 2014-08-04 NOTE — Progress Notes (Signed)
08/04/14  Pharmacy-  Enoxaparin 1825   Anti-Xa level 1.25 (4hr after Lovenox dose)  A/P:  49yo male s/p thrombectomy of leg thrombus, bridging to Coumadin with Lovenox.  Anti-Xa level is elevated, goal 0.6-1, on current dose of Lovenox 180mg  SQ q12h.  No bleeding noted.  1- Reduce Lovenox to 150mg  SQ q12 2- Continue to watch for s/s bleeding  Marisue HumbleKendra Lawrie Tunks, PharmD Clinical Pharmacist [facility]

## 2014-08-04 NOTE — Progress Notes (Signed)
Bladder scan done per dr. Magdalene Mollyrder, scan done center of bladder, 139 ml noted. Pt in no pain or discomfort. Hiram Combererek Marysa Wessner RN

## 2014-08-04 NOTE — Progress Notes (Signed)
[facility] PROGRESS NOTE  Rickey Calderon WUJ:[id number] DOB: Jun 26, 1966 DOA: 07/24/2014 PCP: No PCP Per Patient, recently moved to [place] from [place]   Brief narrative: 49 y/o obese male with OSA on CPAP,HTN, gout, and chr prostatitis admitted with rt groin pain and claudication. [facility] for Abdominal aortogram. 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 - Patient had right lower extremity angiogram with thrombectomy of right superficial femoral, popliteal, anterior tibial and peroneal artery, with above knee right popliteal artery exposure, findings were concerning for acute embolus, so patient was started on anticoagulation with heparin drip, addition to Lovenox being started on warfarin as well. Patient started to have worsening renal function, mild urinary retention as well.   Assessment/Plan:  Rt groin pain with claudication/ acute thrombotic event of right lower extremity  - placed on IV heparin initially on admission. vascular following underwent abdominal aortogram showing patent renal, bilateral common iliac, ext iliac and b/l hypogastric arteries.  occlusion of rt superficial femoral artery noted and Occluded DP on right. - 2-D echo showing EF 60%, CT chest was PE protocol, though aorta was not well visualized, but no obvious source of thrombus. -Patient had right lower extremity angiogram with thrombectomy of right superficial femoral, popliteal, anterior tibial and peroneal artery, with above knee right popliteal artery exposure. - findings were  concerning for acute embolus, so patient was started on anticoagulation with heparin drip, currently bridging with Lovenox, till INR is therapeutic on warfarin. Will need to check anti-Xa level after third dose of Lovenox to ensure therapeutic dosing given body weight. - Will need hypercoagulable workup as an outpatient.  Acute renal failure - Creatinine continues to rise, 1.77 today, urinary retention might be contributing to it, but no significant retention over the last 24 hours. - contast induced ?? as patient received IV contrast recently with his CT chest and with angiogram. - Continue with IV fluids  HTN Blood pressure appears to be on the lower side, so will discontinue amlodipine, will discontinue lisinopril given acute renal failure, and will decrease metoprolol to 75 mg oral twice a day.  Gout  continue allopurinol  OSA  continue bedtime CPAP  Obesity  counseled on weight loss and exercise.    DVT prophylaxis: On full anticoagulation  Diet: regular  Code Status: full Family Communication: none at bedside Disposition Plan: home when INR is therapeutic.   Consultants:  Vascular sx Dr. Myra Gianottibrabham  Procedures:  Abdominal aortogram 2/1  CTangio abd and pelvis 1/29   MRI hip1/31  Antibiotics:  none  HPI/Subjective: Seen and examined . Reports mild rt groin pain and occasional claudication.   Objective: Filed Vitals:   08/04/14 0530  BP: 116/83  Pulse: 70  Temp: 98.1 F (36.7 C)  Resp: 18    Intake/Output Summary (Last 24 hours) at 08/04/14 1057 Last data filed at 08/04/14 0829  Gross per 24 hour  Intake    702 ml  Output   4500 ml  Net  -3798 ml   Filed Weights   08/01/14 1726 08/02/14 1446 08/03/14 0415  Weight: 172.1 kg (379 lb 6.6  oz) 173.41 kg (382 lb 4.8 oz) 174.2 kg (384 lb 0.7 oz)    Exam:   General:  Middle aged obese male in NAD   HEENT: no pallor, moist mucosa, supple neck  Chest: clear b/l, no added sounds  CVS: NS1&S2, no  murmurs  GI: soft, NT, ND BS+  Ext: warm, no edema, incision at right upper thigh looks clean no oozing or discharge.  CNS: alert and oriented.     Data Reviewed: Basic Metabolic Panel:  Recent Labs Lab 07/31/14 0421 08/01/14 0508 08/02/14 0146 08/03/14 0450 08/04/14 0540  NA 138 140 135 134* 137  K 4.0 4.2 4.1 4.3 4.3  CL 100 102 99 100 103  CO2 GLUCOSE 100* 95 107* 124* 96  BUN 24*  CREATININE 1.23 1.25 1.22 1.67* 1.77*  CALCIUM 8.8 8.8 8.3* 8.1* 8.5   Liver Function Tests: No results for input(s): AST, ALT, ALKPHOS, BILITOT, PROT, ALBUMIN in the last 168 hours. No results for input(s): LIPASE, AMYLASE in the last 168 hours. No results for input(s): AMMONIA in the last 168 hours. CBC:  Recent Labs Lab 08/01/14 0508 08/02/14 0146 08/03/14 0450 08/04/14 0100 08/04/14 0540  WBC 9.7 12.1* 12.1* 11.5* 11.4*  HGB 11.6* 11.7* 10.4* 9.9* 10.2*  HCT 37.5* 37.7* 33.9* 32.2* 33.6*  MCV 88.4 88.7 89.2 88.5 91.3  PLT 260 219 228 254 243   Cardiac Enzymes: No results for input(s): CKTOTAL, CKMB, CKMBINDEX, TROPONINI in the last 168 hours. BNP (last 3 results) No results for input(s): BNP in the last 8760 hours.  ProBNP (last 3 results) No results for input(s): PROBNP in the last 8760 hours.  CBG:  Recent Labs Lab 07/31/14 [phone] 08/01/14 0611 08/02/14 0743 08/03/14 0610 08/04/14 0720  GLUCAP 100* 124* 117* 105* 95    Recent Results (from the past 240 hour(s))  MRSA PCR Screening     Status: None   Collection Time: 07/31/14  8:37 PM  Result Value Ref Range Status   MRSA by PCR NEGATIVE NEGATIVE Final    Comment:        The GeneXpert MRSA Assay (FDA approved for NASAL specimens only), is one component of a comprehensive MRSA colonization surveillance program. It is not intended to diagnose MRSA infection nor to guide or monitor treatment for MRSA infections.      Studies: No results found.  Scheduled Meds: .  allopurinol  300 mg Oral Daily  . aspirin  81 mg Oral Daily  . docusate sodium  100 mg Oral Daily  . doxazosin  4 mg Oral Daily  . enoxaparin (LOVENOX) injection  180 mg Subcutaneous Q12H  . fluticasone  1 spray Each Nare Daily  . loratadine  10 mg Oral Daily  . metoprolol  75 mg Oral BID  . mometasone-formoterol  2 puff Inhalation BID  . multivitamin with minerals  1 tablet Oral Daily  . pantoprazole  40 mg Oral Daily  . sertraline  200 mg Oral Daily  . tamsulosin  0.4 mg Oral QPC supper  . warfarin  12.5 mg Oral ONCE-1800  . Warfarin - Pharmacist Dosing Inpatient   Does not apply q1800   Continuous Infusions: . sodium chloride 100 mL/hr at 08/04/14 0937     Time spent: 25 minutes    [facility], Zeya Balles  [facility] Pager [phone] If 7PM-7AM, please contact night-coverage at [email or web address], password [facility] 08/04/2014, 10:57 AM  LOS: 11 days

## 2014-08-04 NOTE — Progress Notes (Signed)
ANTICOAGULATION CONSULT NOTE - Follow Up Consult  Pharmacy Consult for Lovenox/Coumadin Indication: s/p thrombectomy  of leg thrombus  No Known Allergies  Patient Measurements: Height: 5\' 7"  (170.2 cm) Weight: (!) 384 lb 0.7 oz (174.2 kg) IBW/kg (Calculated) : 66.1  Vital Signs: Temp: 98.1 F (36.7 C) (02/08 0530) Temp Source: Oral (02/08 0530) BP: 116/83 mmHg (02/08 0530) Pulse Rate: 70 (02/08 0530)  Labs:  Recent Labs  08/02/14 0146 08/02/14 1040 08/02/14 1650 08/03/14 0450 08/04/14 0100 08/04/14 0540  HGB 11.7*  --   --  10.4* 9.9* 10.2*  HCT 37.7*  --   --  33.9* 32.2* 33.6*  PLT 219  --   --  228 254 243  LABPROT 15.1  --   --  17.1*  --  19.1*  INR 1.18  --   --  1.38  --  1.59*  HEPARINUNFRC 0.25* 0.52 0.66 1.10*  --   --   CREATININE 1.22  --   --  1.67*  --  1.77*    Estimated Creatinine Clearance: 78 mL/min (by C-G formula based on Cr of 1.77).   . sodium chloride      Assessment: 49 yo male on Lovenox/Coumadin for thrombosis in R femoral vessel s/p thrombectomy.    INR = 1.59  Goal of Therapy:  Anti-Xa level 0.6-1 four hours after Lovenox dose Monitor platelets by anticoagulation protocol: Yes  INR 2-3   Plan:  Continue Lovenox 180 mg sq Q 12 hours Ant-Xa level today at 1600 pm - 4 hours after Lovenox dose Coumadin 12.5 mg po x 1 today Follow up labs  Thank you. Okey RegalLisa Mireille Lacombe, PharmD [phone]  08/04/2014 8:50 AM

## 2014-08-05 ENCOUNTER — Other Ambulatory Visit: Payer: Self-pay | Admitting: *Deleted

## 2014-08-05 DIAGNOSIS — Z01810 Encounter for preprocedural cardiovascular examination: Secondary | ICD-10-CM

## 2014-08-05 DIAGNOSIS — I739 Peripheral vascular disease, unspecified: Secondary | ICD-10-CM

## 2014-08-05 LAB — CBC
HCT: 31.1 % — ABNORMAL LOW (ref 39.0–52.0)
Hemoglobin: 9.3 g/dL — ABNORMAL LOW (ref 13.0–17.0)
MCH: 27.2 pg (ref 26.0–34.0)
MCHC: 29.9 g/dL — AB (ref 30.0–36.0)
MCV: 90.9 fL (ref 78.0–100.0)
Platelets: 253 10*3/uL (ref 150–400)
RBC: 3.42 MIL/uL — ABNORMAL LOW (ref 4.22–5.81)
RDW: 15 % (ref 11.5–15.5)
WBC: 9.4 10*3/uL (ref 4.0–10.5)

## 2014-08-05 LAB — BASIC METABOLIC PANEL
Anion gap: 9 (ref 5–15)
BUN: 20 mg/dL (ref 6–23)
CO2: 26 mmol/L (ref 19–32)
Calcium: 8.1 mg/dL — ABNORMAL LOW (ref 8.4–10.5)
Chloride: 102 mmol/L (ref 96–112)
Creatinine, Ser: 1.35 mg/dL (ref 0.50–1.35)
GFR calc Af Amer: 70 mL/min — ABNORMAL LOW (ref >90)
GFR, EST NON AFRICAN AMERICAN: 60 mL/min — AB (ref >90)
GLUCOSE: 95 mg/dL (ref 70–99)
POTASSIUM: 4.2 mmol/L (ref 3.5–5.1)
Sodium: 137 mmol/L (ref 135–145)

## 2014-08-05 LAB — PROTIME-INR
INR: 2.02 — AB (ref 0.00–1.49)
PROTHROMBIN TIME: 23 s — AB (ref 11.6–15.2)

## 2014-08-05 LAB — GLUCOSE, CAPILLARY: GLUCOSE-CAPILLARY: 135 mg/dL — AB (ref 70–99)

## 2014-08-05 MED ORDER — WARFARIN SODIUM 10 MG PO TABS: 10.0000 mg | ORAL_TABLET | Freq: Every day | ORAL | Status: DC

## 2014-08-05 MED ORDER — WARFARIN SODIUM 10 MG PO TABS
10.0000 mg | ORAL_TABLET | Freq: Every day | ORAL | Status: DC
  Filled 2014-08-05: qty 1

## 2014-08-05 MED ORDER — DOCUSATE SODIUM 100 MG PO CAPS: 100.0000 mg | ORAL_CAPSULE | Freq: Every day | ORAL | Status: DC

## 2014-08-05 MED ORDER — METOPROLOL TARTRATE 25 MG PO TABS
50.0000 mg | ORAL_TABLET | Freq: Two times a day (BID) | ORAL | Status: DC
Start: 2014-08-05 — End: 2014-08-14

## 2014-08-05 MED ORDER — TAMSULOSIN HCL 0.4 MG PO CAPS: 0.4000 mg | ORAL_CAPSULE | Freq: Every day | ORAL | Status: DC

## 2014-08-05 NOTE — Progress Notes (Signed)
Physical Therapy Treatment Patient Details Name: Marjorie Lussier MRN: [id number] DOB: 1965/08/19 Today's Date: 08/05/2014    History of Present Illness Pt is 49 y/o male admitted for s/p thrombectomy  Superficial femoral, popliteal, anterior tibial, peroneal artery.       PT Comments    Pt is making good progress with mobility. Pt does present with some R LE weakness due to thrombectomy. Pt requires supervision with gait on the unit. Feel pt is close to baseling and will be ready for d/c when medically stable. Pt would benefit from acute PT until d/c.  Follow Up Recommendations  No PT follow up     Equipment Recommendations  Rolling walker with 5" wheels (bariatric)    Recommendations for Other Services       Precautions / Restrictions Precautions Precautions: None Restrictions Weight Bearing Restrictions: No    Mobility  Bed Mobility Overal bed mobility: Modified Independent                Transfers Overall transfer level: Modified independent Equipment used: None Transfers: Sit to/from Stand Sit to Stand: Modified independent (Device/Increase time)         General transfer comment: Bil UE support, wide BOS and rocking in preparation to stand due to obesity.  Ambulation/Gait Ambulation/Gait assistance: Supervision Ambulation Distance (Feet): 200 Feet Assistive device: None Gait Pattern/deviations: Step-through pattern;Decreased stride length;Antalgic Gait velocity: decrease   General Gait Details: Noted increased lateral translation of center of mass over stance leg to be able to unweigh swing LE, due to increased step width; this leads to decreased efficiency of gait; no gross loss of balance noted   Stairs            Wheelchair Mobility    Modified Rankin (Stroke Patients Only)       Balance Overall balance assessment: Needs assistance Sitting-balance support: No upper extremity supported Sitting balance-Leahy Scale: Normal     Standing  balance support: No upper extremity supported;During functional activity Standing balance-Leahy Scale: Good                      Cognition Arousal/Alertness: Awake/alert Behavior During Therapy: WFL for tasks assessed/performed Overall Cognitive Status: Within Functional Limits for tasks assessed                      Exercises Total Joint Exercises Heel Slides: AROM;Strengthening;Both;10 reps;Supine Hip ABduction/ADduction: AROM;Strengthening;Both;10 reps;Supine Long Arc Quad: AROM;Strengthening;Both;10 reps;Seated General Exercises - Lower Extremity Hip Flexion/Marching: AROM;Strengthening;Both;10 reps;Seated    General Comments        Pertinent Vitals/Pain Pain Assessment: No/denies pain    Home Living                      Prior Function            PT Goals (current goals can now be found in the care plan section) Progress towards PT goals: Progressing toward goals    Frequency  Min 3X/week    PT Plan Current plan remains appropriate    Co-evaluation             End of Session Equipment Utilized During Treatment: Gait belt Activity Tolerance: Patient tolerated treatment well Patient left: in chair;with nursing/sitter in room     Time: 1324-4010 PT Time Calculation (min) (ACUTE ONLY): 23 min  Charges:  $Gait Training: 8-22 mins $Therapeutic Exercise: 8-22 mins  G Codes:      Greggory StallionWrisley, Leilah Polimeni Kerstine 08/05/2014, 10:19 AM

## 2014-08-05 NOTE — Progress Notes (Signed)
ANTICOAGULATION CONSULT NOTE - Follow Up Consult  Pharmacy Consult for Lovenox/Coumadin Indication: s/p thrombectomy  of leg thrombus  No Known Allergies  Patient Measurements: Height: 5\' 7"  (170.2 cm) Weight: (!) 384 lb 0.7 oz (174.2 kg) IBW/kg (Calculated) : 66.1  Vital Signs: Temp: 97.9 F (36.6 C) (02/09 0506) Temp Source: Oral (02/09 0506) BP: 119/65 mmHg (02/09 0506) Pulse Rate: 69 (02/09 0506)  Labs:  Recent Labs  08/02/14 1040 08/02/14 1650  08/03/14 0450 08/04/14 0100 08/04/14 0540 08/05/14 0655  HGB  --   --   < > 10.4* 9.9* 10.2* 9.3*  HCT  --   --   < > 33.9* 32.2* 33.6* 31.1*  PLT  --   --   < > 228 254 243 253  LABPROT  --   --   --  17.1*  --  19.1* 23.0*  INR  --   --   --  1.38  --  1.59* 2.02*  HEPARINUNFRC 0.52 0.66  --  1.10*  --   --   --   CREATININE  --   --   --  1.67*  --  1.77* 1.35  < > = values in this interval not displayed.  Estimated Creatinine Clearance: 102.3 mL/min (by C-G formula based on Cr of 1.35).   . sodium chloride 100 mL/hr at 08/04/14 [id number]    Assessment: 49 yo male on Lovenox/Coumadin for thrombosis in R femoral vessel s/p thrombectomy.    INR = 2.02  Goal of Therapy:  Anti-Xa level 0.6-1 four hours after Lovenox dose Monitor platelets by anticoagulation protocol: Yes  INR 2-3   Plan:  DC Lovenox Home with Coumadin 10 mg po daily with follow up on Thursday  Thank you. Okey RegalLisa Isahia Hollerbach, PharmD [phone]  08/05/2014 9:24 AM

## 2014-08-05 NOTE — Clinical Social Work Note (Signed)
CSW provided patient with taxi voucher- no other transport available- Per DR pt is not safe to go by bus.  CSW signing off.  Merlyn LotJenna Holoman, LCSWA Clinical Social Worker [phone]

## 2014-08-05 NOTE — Progress Notes (Addendum)
   [facility] [place]  Subjective  - Ready to go home.   Objective 119/65 69 97.9 F (36.6 C) (Oral) 18 100%  Intake/Output Summary (Last 24 hours) at 08/05/14 0755 Last data filed at 08/05/14 0219  Gross per 24 hour  Intake    906 ml  Output   1900 ml  Net   -994 ml    Doppler signal right PT biphasic Incision well healed  Lungs non labored breathing C Pap   Assessment/Planning: POD # 4  #1: Thrombectomy, right superficial femoral, popliteal, anterior tibial, peroneal artery #2: Above knee right popliteal artery exposure #3: Right lower extremity angiogram was selected images of the anterior tibial and peroneal artery with first order catheter placement  Lovenox to coumadin bridge lab pending this am Rolling walker for home   Clinton GallantCOLLINS, EMMA [facility] 08/05/2014 7:55 AM --  Laboratory Lab Results:  Recent Labs  08/04/14 0100 08/04/14 0540  WBC 11.5* 11.4*  HGB 9.9* 10.2*  HCT 32.2* 33.6*  PLT 254 243   BMET  Recent Labs  08/03/14 0450 08/04/14 0540  NA 134* 137  K 4.3 4.3  CL 100 103  CO2 26 27  GLUCOSE 124* 96  BUN 23 24*  CREATININE 1.67* 1.77*  CALCIUM 8.1* 8.5    COAG Lab Results  Component Value Date   INR 1.59* 08/04/2014   INR 1.38 08/03/2014   INR 1.18 08/02/2014   No results found for: PTT    I agree with the above.  I have seen and examined the patient.  Incision is healing.  Doppler signals in PT AND DP  OK for d/c with f/u in 2 weeks   [place] Yasmen Cortner

## 2014-08-05 NOTE — Discharge Instructions (Signed)

## 2014-08-05 NOTE — Discharge Summary (Signed)
Rickey Calderon, 49 y.o., DOB May 05, 1966, MRN [id number]. Admission date: 07/24/2014 Discharge Date 08/05/2014 Primary MD No PCP Per Patient Admitting Physician Rickey HarpXilin Niu, MD   PCP please follow on: - Patient is being started on warfarin for acute thrombolytic event of right lower extremity, INR is therapeutic at the of discharge at 2, will need repeat INR check during next visit on 2/11, and warfarin dose adjustment , he will be discharged on 10 mg oral daily, length of therapy will be determined by vascular surgery during follow-ups. - Please check BMP during next visit, as patient is recovering from acute renal failure during hospital stay. 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 Hypertension   . Gout   . Prostatitis   . Bronchitis   . Depression   . Seasonal allergies     Past Surgical History  Procedure Laterality Date  . Abdominal aortagram N/A 07/28/2014    Procedure: ABDOMINAL Rickey Calderon;  Surgeon: Rickey Hinthristopher S Dickson, MD;  [facility];  Service: Cardiovascular;  Laterality: N/A;  . Lower extremity angiogram Bilateral 07/28/2014    Procedure: LOWER EXTREMITY ANGIOGRAM;  Surgeon: Rickey Hinthristopher S Dickson, MD;  [facility];  Service: Cardiovascular;  Laterality: Bilateral;  . Thrombectomy femoral artery Right 08/01/2014    Procedure: THROMBECTOMY RIGHT  LEG SFA, Anterior tibial, and perineal arteries with intraoperative arteriograms;  Surgeon: Rickey LibmanVance W Brabham, MD;  [facility];  Service: Vascular;  Laterality: Right;     Hospital Course See H&P, Labs, Consult and Test reports for all details in brief, patient was admitted for **  Principal Problem:   Groin pain Active Problems:   Hypokalemia   Right calf pain   Hypertension   Gout   Prostatitis   Bronchitis   Seasonal allergies   OSA on CPAP   Chronic prostatitis   Essential hypertension   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 [facility] for Abdominal aortogram. 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 - Patient had right lower extremity angiogram with thrombectomy of right superficial femoral, popliteal, anterior tibial and peroneal artery, with above knee right popliteal artery exposure, findings were concerning for acute embolus, so patient was started on anticoagulation with heparin drip, weight to Lovenox being started on warfarin as well. Patient is being discharged on 2/9, with therapeutic INR  of 2.0 to Patient started to have worsening renal function, mild urinary retention as well, added Flomax to Proscar, renal function back to baseline at 1.35 a day of discharge.  Rt groin pain with claudication/ acute thrombotic event of right lower extremity -Seen by vascular vascular.  underwent abdominal aortogram  showing patent renal, bilateral common iliac, ext iliac and b/l hypogastric arteries. occlusion of rt superficial femoral artery noted and Occluded DP on right. - 2-D echo showing EF 60%, CT chest was PE protocol, though aorta was not well visualized, but no obvious source of thrombus. -Patient had right lower extremity angiogram with thrombectomy of right superficial femoral, popliteal, anterior tibial and peroneal artery, with above knee right popliteal artery exposure. - findings were concerning for acute embolus, so patient was started on anticoagulation , recommendation for warfarin by vascular surgery, length of treatment to be determined by vascular surgery during follow-up visits. - Will need hypercoagulable workup as an outpatient.  Acute renal failure - Creatinine peaked at 1.77, creatinine is 1.35 a day of discharge. - contast induced ?? as patient received IV contrast recently with his CT chest and with angiogram, versus urinary retention secondary to BPH. - Improved with hydration, continue to hold hydrochlorothiazide and lisinopril on discharge.  HTN Blood pressure appears to be on the lower side during hospital stay, so will discontinue amlodipine on discharge, will discontinue lisinopril given acute renal failure and soft pressure, and will decrease metoprolol to 50 mg oral twice a day on discharge.  Gout continue allopurinol  OSA continue bedtime CPAP  Obesity counseled on weight loss and exercise.    Consultants:  Vascular sx Dr. Myra Calderon  Procedures:  Abdominal aortogram 2/1  CTangio abd and pelvis 1/29  MRI hip1/31  Antibiotics:  none    Significant Tests:  See full reports for all details    Ct Abdomen Pelvis Wo Contrast  07/24/2014   CLINICAL DATA:  Right inguinal pain for 5 days.  EXAM: CT ABDOMEN AND PELVIS WITHOUT CONTRAST  TECHNIQUE: Multidetector CT imaging of the abdomen and pelvis was performed following the standard protocol without IV  contrast.  COMPARISON:  None.  FINDINGS: No urinary calculi are evident. There is no hydronephrosis or ureteral dilatation. There is an upper pole right renal cyst measuring 3.3 cm, not characterized in the absence of intravenous contrast.  The appendix is normal.  There is inflammatory stranding surrounding the right common femoral artery and vein in the inguinal region, extending proximally around the right external iliac artery and vein and continuing all the way up to the level of the aortic bifurcation. This inflammatory stranding is not accompanied by a drainable fluid collection or soft tissue gas. No adenopathy is evident. There is no hernia. No other inflammatory changes are evident elsewhere in the abdomen or pelvis.  There are grossly unremarkable unenhanced appearances of the liver, spleen, pancreas and adrenals. Mesentery and bowel appear unremarkable except for uncomplicated colonic diverticulosis. No significant abnormalities are evident in the lower chest.  IMPRESSION: 1. Inflammatory-appearing stranding around the right common femoral artery and vein in the inguinal region, extending proximally up to the level of the aortic bifurcation. This might represent a thrombophlebitis. 2. No evidence of an inguinal hernia, adenopathy or mass. 3. No urinary calculi. No hydronephrosis or ureteral dilatation. Normal appendix.   Electronically Signed   By: Ellery Plunk M.D.   On: 07/24/2014 22:01   Ct Angio Chest Pe W/cm &/or Wo Cm  07/30/2014   CLINICAL DATA:  Shortness of breath with exertion. Groin pain. Thrombus of pulmonary vein.  EXAM: CT ANGIOGRAPHY CHEST WITH CONTRAST  TECHNIQUE: Multidetector CT imaging of the chest was performed using the standard protocol during bolus administration of intravenous contrast. Multiplanar CT image reconstructions and MIPs were obtained to evaluate the vascular anatomy.  CONTRAST:  OMNIPAQUE IOHEXOL 350 MG/ML SOLN  COMPARISON:  None.  FINDINGS: Significant  patient motion artifact, suboptimal contrast bolus timing, and soft tissue attenuation from body habitus limits evaluation. There are no filling defects within the main or lobar branches. The subsegmental branches are not well assessed.  There is multi chamber cardiomegaly. There is likely mild aneurysmal dilatation of the mid ascending aorta, measuring 3.8- 4.0 cm, however accurate caliber placement is difficult due to motion through this region. There is no pleural or pericardial effusion. There is no mediastinal or hilar adenopathy.  Right and left lower lobe linear atelectasis. No consolidation to suggest pneumonia.  No acute abnormality in the included upper abdomen. There is degenerative change in the mid lower thoracic spine.  Review of the MIP images confirms the above findings.  IMPRESSION: 1. Limited assessment demonstrates no central pulmonary embolus. Please note the segmental in subsegmental branches are are not well assessed. 2. Cardiomegaly. Suspect mild aneurysmal dilatation of the ascending aorta, up to 4 cm. Recommend annual imaging followup by CTA or MRA. This recommendation follows 2010 ACCF/AHA/AATS/ACR/ASA/SCA/SCAI/SIR/STS/SVM Guidelines for the Diagnosis and Management of Patients with Thoracic Aortic Disease. Circulation. 2010; 121: X914-N829   Electronically Signed   By: Rubye Oaks M.D.   On: 07/30/2014 01:45   Ct Angio Pelvis W/cm &/or Wo/cm  07/25/2014   CLINICAL DATA:  Right groin pain.  EXAM: CT ANGIOGRAPHY OF PELVIS  TECHNIQUE: Multidetector CT imaging of the pelvis was performed before and during bolus injection of intravenous contrast. Multiplanar CT angiographic image reconstructions including MIPs were generated to evaluate the vascular anatomy.  CONTRAST:  OMNIPAQUE IOHEXOL 350 MG/ML SOLN  COMPARISON:  07/24/2014,  FINDINGS: There is good arterial enhancement without focal stenosis. Venous opacification is suboptimal. There is persistent expansion at the junction of  the right common femoral vein and right external iliac vein with perivascular fat stranding.  There is no inguinal lymphadenopathy. There are small subcentimeter bilateral inguinal lymph nodes. There is no fluid collection.  The visualized small bowel and colon are normal. There is no pelvic free fluid. The bladder is normal. There is no acute osseous abnormality. There is no lytic or sclerotic osseous lesion. There is degenerative disc disease at L3-4, L4-5 and L5-S1.  Review of the MIP images confirms the above findings.  IMPRESSION: 1. There is suboptimal venous opacification. There is persistent expansion at the junction of the right common femoral vein and right external iliac vein with surrounding inflammatory changes, which may reflect thrombophlebitis. Evaluation with a focused right lower extremity ultrasound in the region of the right groin is recommended.   Electronically Signed   By: Elige Ko   On: 07/25/2014 23:55   Ct Pelvis W Contrast  07/25/2014   CLINICAL DATA:  Right inguinal pain.  EXAM: CT PELVIS WITH CONTRAST  TECHNIQUE: Multidetector CT imaging of the pelvis was performed using the standard protocol following the bolus administration of intravenous contrast.  CONTRAST:  OMNIPAQUE IOHEXOL 300 MG/ML  SOLN  COMPARISON:  Unenhanced CT performed earlier this evening  FINDINGS: A 65 second delay was used for contrast timing, intending to scanned during venous phase of the pelvis. Unfortunately, there  is good opacification of the arteries but the veins are not opacified. There continues to be inflammatory -appearing stranding around the right common femoral vein and right external iliac vein. This may represent a thrombophlebitis but venous patency is not conclusive given the lack of opacification of any of the veins in the area.  A few small nodes are visible in the region, likely reactive.  IMPRESSION: Inflammatory-appearing stranding opacities around the right common femoral vein. This  may represent thrombophlebitis. Unfortunately we did not achieve good venous opacification to assess the patency of the vein.   Electronically Signed   By: Ellery Plunk M.D.   On: 07/25/2014 00:00   Mr Hip Right W Wo Contrast  07/27/2014   CLINICAL DATA:  Right groin pain. Edematous/inflammatory process around the common femoral vessels on CT.  EXAM: MRI OF THE RIGHT HIP WITHOUT AND WITH CONTRAST  TECHNIQUE: Multiplanar, multisequence MR imaging was performed both before and after administration of intravenous contrast.  CONTRAST:  20 mL MultiHance IV  COMPARISON:  CT 07/25/2014 and earlier studies  FINDINGS: Bones: Unremarkable  Articular cartilage and labrum  Articular cartilage:  Negative  Labrum:  Negative  Joint or bursal effusion  Joint effusion:  None  Bursae:  Negative  Muscles and tendons  Muscles and tendons:  Negative  Other findings  There is eccentric wall thickening in the distal common femoral artery, and more marked circumferential wall thickening in the proximal superficial femoral artery. There are surrounding edematous/inflammatory changes with circumferential perivascular enhancement after IV contrast administration. Signal in the proximal SFA suggesting intraluminal thrombus or plaque, with an indeterminate degree of luminal occlusion. Normal flow void in the adjacent common femoral vein.  IMPRESSION: 1. Negative right hip. 2. 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 3. Possible thrombus in the proximal SFA with indeterminate degree of occlusion. Correlate with ABIs and clinical symptomatology.   Electronically Signed   By: Oley Balm M.D.   On: 07/27/2014 13:04     Today   Subjective:   Rickey Calderon today has no headache,no chest abdominal pain,no new weakness tingling or numbness, feels much better wants to go home today.   Objective:   Blood pressure 119/65, pulse 69, temperature 97.9 F (36.6 C), temperature source  Oral, resp. rate 18, height 5\' 7"  (1.702 m), weight 174.2 kg (384 lb 0.7 oz), SpO2 100 %.  Intake/Output Summary (Last 24 hours) at 08/05/14 1256 Last data filed at 08/05/14 1014  Gross per 24 hour  Intake    906 ml  Output   1700 ml  Net   -794 ml    Exam Awake Alert, Oriented *3, No new F.N deficits, Normal affect [place].AT,PERRAL Supple Neck,No JVD, No cervical lymphadenopathy appriciated.  Symmetrical Chest wall movement, Good air movement bilaterally, CTAB RRR,No Gallops,Rubs or new Murmurs, No Parasternal Heave +ve B.Sounds, Abd Soft, Non tender, No organomegaly appriciated, No rebound -guarding or rigidity. No Cyanosis, Clubbing or edema, No new Rash or bruise, right thigh surgical scar looks clean.  Data Review   Cultures -   CBC w Diff:  Lab Results  Component Value Date   WBC 9.4 08/05/2014   HGB 9.3* 08/05/2014   HCT 31.1* 08/05/2014   PLT 253 08/05/2014   LYMPHOPCT 15 07/24/2014   MONOPCT 7 07/24/2014   EOSPCT 3 07/24/2014   BASOPCT 0 07/24/2014   CMP:  Lab Results  Component Value Date   NA 137 08/05/2014   K 4.2 08/05/2014  CL 102 08/05/2014   CO2 26 08/05/2014   BUN 20 08/05/2014   CREATININE 1.35 08/05/2014   PROT 6.5 07/25/2014   ALBUMIN 3.2* 07/25/2014   BILITOT 0.5 07/25/2014   ALKPHOS 68 07/25/2014   AST 17 07/25/2014   ALT 21 07/25/2014  .  Micro Results Recent Results (from the past 240 hour(s))  MRSA PCR Screening     Status: None   Collection Time: 07/31/14  8:37 PM  Result Value Ref Range Status   MRSA by PCR NEGATIVE NEGATIVE Final    Comment:        The GeneXpert MRSA Assay (FDA approved for NASAL specimens only), is one component of a comprehensive MRSA colonization surveillance program. It is not intended to diagnose MRSA infection nor to guide or monitor treatment for MRSA infections.      Discharge Instructions     Follow-up INR during next visit, and adjust warfarin as needed. Recent review the hypercoagulable  workup as an outpatient Check BMP during next visit     Follow-up Information    Follow up with [facility]     On 08/07/2014.   Why:  f/u appointment with MD- at 9:00 then INR check at 10:00   Contact information:   [address] [place] [address] [phone]      Follow up with Jorge Ny, MD. Schedule an appointment as soon as possible for a visit in 2 weeks.   Specialty:  Vascular Surgery   Why:  FOR WOUND CHECK   Contact information:   [address] [place] [address] [phone]       Discharge Medications     Medication List    STOP taking these medications        amLODipine 10 MG tablet  Commonly known as:  NORVASC     hydrochlorothiazide 25 MG tablet  Commonly known as:  HYDRODIURIL     lisinopril 40 MG tablet  Commonly known as:  PRINIVIL,ZESTRIL     naproxen sodium 220 MG tablet  Commonly known as:  ANAPROX     potassium chloride SA 20 MEQ tablet  Commonly known as:  K-DUR,KLOR-CON      TAKE these medications        allopurinol 300 MG tablet  Commonly known as:  ZYLOPRIM  Take 300 mg by mouth daily.     desloratadine 5 MG tablet  Commonly known as:  CLARINEX  Take 5 mg by mouth daily.     docusate sodium 100 MG capsule  Commonly known as:  COLACE  Take 1 capsule (100 mg total) by mouth daily.     doxazosin 4 MG tablet  Commonly known as:  CARDURA  Take 4 mg by mouth daily.     Fluticasone-Salmeterol 100-50 MCG/DOSE Aepb  Commonly known as:  ADVAIR  Inhale 1 puff into the lungs 2 (two) times daily.     metoprolol tartrate 25 MG tablet  Commonly known as:  LOPRESSOR  Take 2 tablets (50 mg total) by mouth 2 (two) times daily.     mometasone 50 MCG/ACT nasal spray  Commonly known as:  NASONEX  Place 1 spray into the nose 2 (two) times daily.     multivitamin with minerals Tabs tablet  Take 1 tablet by mouth daily.     sertraline 100 MG tablet  Commonly known as:   ZOLOFT  Take 200 mg by mouth daily.     tamsulosin 0.4  MG Caps capsule  Commonly known as:  FLOMAX  Take 1 capsule (0.4 mg total) by mouth daily after supper.     warfarin 10 MG tablet  Commonly known as:  COUMADIN  Take 1 tablet (10 mg total) by mouth daily at 6 PM.         Total Time in preparing paper work, data evaluation and todays exam - 35 minutes  Veronica Fretz M.D on 08/05/2014 at 12:56 PM  [facility] Office  [phone]

## 2014-08-05 NOTE — Evaluation (Signed)
Occupational Therapy Evaluation Patient Details Name: Rickey MazeLennard Calderon MRN: [id number] DOB: 1965/12/09 Today's Date: 08/05/2014    History of Present Illness Pt is 49 y/o male admitted for s/p thrombectomy  Superficial femoral, popliteal, anterior tibial, peroneal artery.      Clinical Impression   Educated and issued pt adaptive equipment for LB bathing and dressing. Pt moving about his room at a modified independent level pushing IV pole.  Pt is eager to discharge.    Follow Up Recommendations  No OT follow up;Supervision - Intermittent    Equipment Recommendations  3 in 1 bedside comode (bariatric)    Recommendations for Other Services       Precautions / Restrictions Precautions Precautions: None Restrictions Weight Bearing Restrictions: No      Mobility Bed Mobility              General bed mobility comments: pt up in chair  Transfers Overall transfer level: Modified independent Equipment used: None Transfers: Sit to/from Stand Sit to Stand: Modified independent (Device/Increase time)         General transfer comment: uses momentum and UEs    Balance Overall balance assessment: Needs assistance Sitting-balance support: No upper extremity supported Sitting balance-Leahy Scale: Normal     Standing balance support: No upper extremity supported;During functional activity Standing balance-Leahy Scale: Good                              ADL Overall ADL's : Needs assistance/impaired               Lower Body Bathing Details (indicate cue type and reason): instructed and issued long sponge and reacher for washing and drying feet/back     Lower Body Dressing: Sit to/from stand;Modified independent Lower Body Dressing Details (indicate cue type and reason): Instructed and issued reacher, sock aid, long shoe horn. Toilet Transfer: Ambulation;Regular Toilet;Grab bars;Modified Independent Toilet Transfer Details (indicate cue type and reason):  stood to urinate Toileting- ArchitectClothing Manipulation and Hygiene: Sit to/from stand;Modified independent         General ADL Comments: Pt demonstrated knowledge in use of AE.  Pt agreeable to bariatric 3 in 1, educated in multiple uses.     Vision                     Perception     Praxis      Pertinent Vitals/Pain Pain Assessment: No/denies pain     Hand Dominance     Extremity/Trunk Assessment             Communication     Cognition Arousal/Alertness: Awake/alert Behavior During Therapy: WFL for tasks assessed/performed Overall Cognitive Status: Within Functional Limits for tasks assessed                     General Comments       Exercises       Shoulder Instructions      Home Living                                          Prior Functioning/Environment               OT Diagnosis:     OT Problem List:     OT Treatment/Interventions:      OT Goals(Current goals can be  found in the care plan section) Acute Rehab OT Goals Patient Stated Goal: To go home  OT Frequency: Min 2X/week   Barriers to D/C:            Co-evaluation              End of Session    Activity Tolerance: Patient tolerated treatment well Patient left: in chair   Time: 1010-1036 OT Time Calculation (min): 26 min Charges:  OT General Charges $OT Visit: 1 Procedure OT Treatments $Self Care/Home Management : 23-37 mins G-Codes:    Evern Bio 08/05/2014, 10:42 AM  [phone]

## 2014-08-06 LAB — GLUCOSE, CAPILLARY: Glucose-Capillary: 112 mg/dL — ABNORMAL HIGH (ref 70–99)

## 2014-08-07 ENCOUNTER — Emergency Department (HOSPITAL_COMMUNITY)
Admission: EM | Admit: 2014-08-07 | Discharge: 2014-08-07 | Disposition: A | Discharge status: Home / Self Care | Payer: Medicaid Other | Attending: Emergency Medicine | Admitting: Emergency Medicine

## 2014-08-07 ENCOUNTER — Inpatient Hospital Stay: Payer: Self-pay

## 2014-08-07 DIAGNOSIS — I10 Essential (primary) hypertension: Secondary | ICD-10-CM | POA: Diagnosis not present

## 2014-08-07 DIAGNOSIS — Z7901 Long term (current) use of anticoagulants: Secondary | ICD-10-CM | POA: Insufficient documentation

## 2014-08-07 DIAGNOSIS — G8918 Other acute postprocedural pain: Secondary | ICD-10-CM | POA: Insufficient documentation

## 2014-08-07 DIAGNOSIS — Z7952 Long term (current) use of systemic steroids: Secondary | ICD-10-CM | POA: Insufficient documentation

## 2014-08-07 DIAGNOSIS — Z79899 Other long term (current) drug therapy: Secondary | ICD-10-CM | POA: Diagnosis not present

## 2014-08-07 DIAGNOSIS — Z87448 Personal history of other diseases of urinary system: Secondary | ICD-10-CM | POA: Insufficient documentation

## 2014-08-07 DIAGNOSIS — Z8709 Personal history of other diseases of the respiratory system: Secondary | ICD-10-CM | POA: Insufficient documentation

## 2014-08-07 DIAGNOSIS — M79662 Pain in left lower leg: Secondary | ICD-10-CM | POA: Diagnosis present

## 2014-08-07 DIAGNOSIS — F329 Major depressive disorder, single episode, unspecified: Secondary | ICD-10-CM | POA: Diagnosis not present

## 2014-08-07 LAB — CBC WITH DIFFERENTIAL/PLATELET
Basophils Absolute: 0 10*3/uL (ref 0.0–0.1)
Basophils Relative: 0 % (ref 0–1)
Eosinophils Absolute: 0.3 10*3/uL (ref 0.0–0.7)
Eosinophils Relative: 3 % (ref 0–5)
HCT: 28.3 % — ABNORMAL LOW (ref 39.0–52.0)
Hemoglobin: 8.5 g/dL — ABNORMAL LOW (ref 13.0–17.0)
Lymphocytes Relative: 8 % — ABNORMAL LOW (ref 12–46)
Lymphs Abs: 0.7 10*3/uL (ref 0.7–4.0)
MCH: 27.2 pg (ref 26.0–34.0)
MCHC: 30 g/dL (ref 30.0–36.0)
MCV: 90.4 fL (ref 78.0–100.0)
Monocytes Absolute: 1.1 10*3/uL — ABNORMAL HIGH (ref 0.1–1.0)
Monocytes Relative: 11 % (ref 3–12)
NEUTROS ABS: 7.7 10*3/uL (ref 1.7–7.7)
NEUTROS PCT: 78 % — AB (ref 43–77)
PLATELETS: 269 10*3/uL (ref 150–400)
RBC: 3.13 MIL/uL — ABNORMAL LOW (ref 4.22–5.81)
RDW: 14.8 % (ref 11.5–15.5)
WBC: 9.9 10*3/uL (ref 4.0–10.5)

## 2014-08-07 LAB — BASIC METABOLIC PANEL
Anion gap: 10 (ref 5–15)
BUN: 14 mg/dL (ref 6–23)
CO2: 28 mmol/L (ref 19–32)
Calcium: 8.2 mg/dL — ABNORMAL LOW (ref 8.4–10.5)
Chloride: 103 mmol/L (ref 96–112)
Creatinine, Ser: 1.11 mg/dL (ref 0.50–1.35)
GFR calc non Af Amer: 76 mL/min — ABNORMAL LOW (ref >90)
GFR, EST AFRICAN AMERICAN: 88 mL/min — AB (ref >90)
Glucose, Bld: 117 mg/dL — ABNORMAL HIGH (ref 70–99)
POTASSIUM: 4.3 mmol/L (ref 3.5–5.1)
SODIUM: 141 mmol/L (ref 135–145)

## 2014-08-07 LAB — PROTIME-INR
INR: 2.38 — AB (ref 0.00–1.49)
PROTHROMBIN TIME: 26.1 s — AB (ref 11.6–15.2)

## 2014-08-07 LAB — BRAIN NATRIURETIC PEPTIDE: B Natriuretic Peptide: 188.7 pg/mL — ABNORMAL HIGH (ref 0.0–100.0)

## 2014-08-07 MED ORDER — OXYCODONE-ACETAMINOPHEN 5-325 MG PO TABS: 1.0000 | ORAL_TABLET | ORAL | Status: DC | PRN

## 2014-08-07 MED ORDER — OXYCODONE-ACETAMINOPHEN 5-325 MG PO TABS
2.0000 | ORAL_TABLET | Freq: Once | ORAL | Status: AC
Start: 2014-08-07 — End: 2014-08-07
  Administered 2014-08-07: 2 via ORAL
  Filled 2014-08-07: qty 2

## 2014-08-07 NOTE — Progress Notes (Signed)
Pt recently s/p right leg thrombectomy.  Came to ER with pain  Right leg with more edema.  Good doppler signal to PT and DP Incision intact  Suspect patient has been more ambulatory and has some increased swelling which has increased his pain.  I have encouraged him to keep leg elevated.  No infection sespected.  Has f/u in 2 weeks   Rickey Calderon

## 2014-08-07 NOTE — ED Notes (Signed)
Patient had a blood clot removed from his right leg on Friday.  D/C home on Tuesday, Pain worsened yesterday. Patient reports swelling also.

## 2014-08-07 NOTE — Discharge Instructions (Signed)
Pain Relief Preoperatively and Postoperatively °Being a good patient does not mean being a silent one. If you have questions, problems, or concerns about the pain you may feel after surgery, let your caregiver know. Patients have the right to assessment and management of pain. The treatment of pain after surgery is important to speed up recovery and return to normal activities. Severe pain after surgery, and the fear or anxiety associated with that pain, may cause extreme discomfort that: °· Prevents sleep. °· Decreases the ability to breathe deeply and cough. This can cause pneumonia or other upper airway infections. °· Causes your heart to beat faster and your blood pressure to be higher. °· Increases the risk for constipation and bloating. °· Decreases the ability of wounds to heal. °· May result in depression, increased anxiety, and feelings of helplessness. °Relief of pain before surgery is also important because it will lessen the pain after surgery. Patients who receive both pain relief before and after surgery experience greater pain relief than those who only receive pain relief after surgery. Let your caregiver know if you are having uncontrolled pain. This is very important. Pain after surgery is more difficult to manage if it is permitted to become severe, so prompt and adequate treatment of acute pain is necessary. °PAIN CONTROL METHODS °Your caregivers follow policies and procedures about the management of patient pain. These guidelines should be explained to you before surgery. Plans for pain control after surgery must be mutually decided upon and instituted with your full understanding and agreement. Do not be afraid to ask questions regarding the care you are receiving. There are many different ways your caregivers will attempt to control your pain, including the following methods. °As needed pain control °· You may be given pain medicine either through your intravenous (IV) tube, or as a pill or  liquid you can swallow. You will need to let your caregiver know when you are having pain. Then, your caregiver will give you the pain medicine ordered for you. °· Your pain medicine may make you constipated. If constipation occurs, drink more liquids if you can. Your caregiver may have you take a mild laxative. °IV patient-controlled analgesia pump (PCA pump) °· You can get your pain medicine through the IV tube which goes into your vein. You are able to control the amount of pain medicine that you get. The pain medicine flows in through an IV tube and is controlled by a pump. This pump gives you a set amount of pain medicine when you push the button hooked up to it. Nobody should push this button but you or someone specifically assigned by you to do so. It is set up to keep you from accidentally giving yourself too much pain medicine. You will be able to start using your pain pump in the recovery room after your surgery. This method can be helpful for most types of surgery. °· If you are still having too much pain, tell your caregiver. Also, tell your caregiver if you are feeling too sleepy or nauseous. °Continuous epidural pain control °· A thin, soft tube (catheter) is put into your back. Pain medicine flows through the catheter to lessen pain in the part of your body where the surgery is done. Continuous epidural pain control may work best for you if you are having surgery on your chest, abdomen, hip area, or legs. The epidural catheter is usually put into your back just before surgery. The catheter is left in until you can eat and take medicine by mouth. In most cases,   this may take 2 to 3 days. °· Giving pain medicine through the epidural catheter may help you heal faster because: °¨ Your bowel gets back to normal faster. °¨ You can get back to eating sooner. °¨ You can be up and walking sooner. °Medicine that numbs the area (local anesthetic) °· You may receive an injection of pain medicine near where the  pain is (local infiltration). °· You may receive an injection of pain medicine near the nerve that controls the sensation to a specific part of the body (peripheral nerve block). °· Medicine may be put in the spine to block pain (spinal block). °Opioids °· Moderate to moderately severe acute pain after surgery may respond to opioids. Opioids are narcotic pain medicine. Opioids are often combined with non-narcotic medicines to improve pain relief, diminish the risk of side effects, and reduce the chance of addiction. °· If you follow your caregiver's directions about taking opioids and you do not have a history of substance abuse, your risk of becoming addicted is exceptionally small. Opioids are given for short periods of time in careful doses to prevent addiction. °Other methods of pain control include: °· Steroids. °· Physical therapy. °· Heat and cold therapy. °· Compression, such as wrapping an elastic bandage around the area of pain. °· Massage. °These various ways of controlling pain may be used together. Combining different methods of pain control is called multimodal analgesia. Using this approach has many benefits, including being able to eat, move around, and leave the hospital sooner. °Document Released: 09/03/2002 Document Revised: 09/05/2011 Document Reviewed: 09/07/2010 °[employer]® Patient Information ©2015 [employer]. This information is not intended to replace advice given to you by your health care provider. Make sure you discuss any questions you have with your health care provider. ° °

## 2014-08-07 NOTE — ED Provider Notes (Signed)
CSN: [id number]     Arrival date & time 08/07/14  0935 History   First MD Initiated Contact with Patient 08/07/14 [phone]     Chief Complaint  Patient presents with  . Leg Pain     (Consider location/radiation/quality/duration/timing/severity/associated sxs/prior Treatment) Patient is a 49 y.o. male presenting with leg pain. The history is provided by the patient. No language interpreter was used.  Leg Pain Location:  Leg Leg location:  R upper leg and R lower leg Pain details:    Quality:  Aching and sharp   Radiates to:  R leg   Severity:  Severe   Duration:  6 days   Timing:  Constant   Progression:  Worsening Chronicity:  New Dislocation: no   Foreign body present:  No foreign bodies Tetanus status:  Up to date Prior injury to area:  No Relieved by:  Rest (oxycodone) Worsened by:  Activity (standing) Ineffective treatments:  None tried Associated symptoms: numbness, swelling and tingling   Associated symptoms: no back pain, no fatigue and no fever   Risk factors: obesity   Risk factors: no concern for non-accidental trauma, no frequent fractures, no known bone disorder and no recent illness   Risk factors comment:  Recent thrombectomy RLE   Past Medical History  Diagnosis Date  . Hypertension   . Gout   . Prostatitis   . Bronchitis   . Depression   . Seasonal allergies    Past Surgical History  Procedure Laterality Date  . Abdominal aortagram N/A 07/28/2014    Procedure: ABDOMINAL Ronny FlurryAORTAGRAM;  Surgeon: Chuck Hinthristopher S Dickson, MD;  [facility];  Service: Cardiovascular;  Laterality: N/A;  . Lower extremity angiogram Bilateral 07/28/2014    Procedure: LOWER EXTREMITY ANGIOGRAM;  Surgeon: Chuck Hinthristopher S Dickson, MD;  [facility];  Service: Cardiovascular;  Laterality: Bilateral;  . Thrombectomy femoral artery Right 08/01/2014    Procedure: THROMBECTOMY RIGHT LEG SFA, Anterior tibial, and perineal arteries with intraoperative arteriograms;  Surgeon: Nada LibmanVance W  Brabham, MD;  [facility];  Service: Vascular;  Laterality: Right;   Family History  Problem Relation Age of Onset  . Diabetes Father    History  Substance Use Topics  . Smoking status: Never Smoker   . Smokeless tobacco: Not on file  . Alcohol Use: No    Review of Systems  Constitutional: Negative for fever, activity change, appetite change and fatigue.  HENT: Negative for congestion, facial swelling, rhinorrhea and trouble swallowing.   Eyes: Negative for photophobia and pain.  Respiratory: Negative for cough, chest tightness and shortness of breath.   Cardiovascular: Negative for chest pain and leg swelling.  Gastrointestinal: Negative for nausea, vomiting, abdominal pain, diarrhea and constipation.  Endocrine: Negative for polydipsia and polyuria.  Genitourinary: Negative for dysuria, urgency, decreased urine volume and difficulty urinating.  Musculoskeletal: Negative for back pain and gait problem.  Skin: Negative for color change, rash and wound.  Allergic/Immunologic: Negative for immunocompromised state.  Neurological: Negative for dizziness, facial asymmetry, speech difficulty, weakness, numbness and headaches.  Psychiatric/Behavioral: Negative for confusion, decreased concentration and agitation.      Allergies  Review of patient's allergies indicates no known allergies.  Home Medications   Prior to Admission medications   Medication Sig Start Date End Date Taking? Authorizing Provider  allopurinol (ZYLOPRIM) 300 MG tablet Take 300 mg by mouth daily.   Yes Historical Provider, MD  amLODipine (NORVASC) 10 MG tablet Take 10 mg by mouth daily.   Yes Historical  Provider, MD  desloratadine (CLARINEX) 5 MG tablet Take 5 mg by mouth daily.   Yes Historical Provider, MD  docusate sodium (COLACE) 100 MG capsule Take 1 capsule (100 mg total) by mouth daily. 08/05/14  Yes Huey Bienenstock, MD  doxazosin (CARDURA) 4 MG tablet Take 4 mg by mouth daily.   Yes Historical  Provider, MD  Fluticasone-Salmeterol (ADVAIR) 100-50 MCG/DOSE AEPB Inhale 1 puff into the lungs 2 (two) times daily.   Yes Historical Provider, MD  hydrochlorothiazide (HYDRODIURIL) 25 MG tablet Take 25 mg by mouth daily.   Yes Historical Provider, MD  lisinopril (PRINIVIL,ZESTRIL) 40 MG tablet Take 40 mg by mouth daily.   Yes Historical Provider, MD  metoprolol (LOPRESSOR) 25 MG tablet Take 2 tablets (50 mg total) by mouth 2 (two) times daily. 08/05/14  Yes Dawood Elgergawy, MD  mometasone (NASONEX) 50 MCG/ACT nasal spray Place 1 spray into the nose 2 (two) times daily.   Yes Historical Provider, MD  Multiple Vitamin (MULTIVITAMIN WITH MINERALS) TABS tablet Take 1 tablet by mouth daily.   Yes Historical Provider, MD  oxyCODONE (OXY IR/ROXICODONE) 5 MG immediate release tablet Take 5-10 mg by mouth every 6 (six) hours as needed for severe pain.   Yes Historical Provider, MD  potassium chloride SA (K-DUR,KLOR-CON) 20 MEQ tablet Take 20 mEq by mouth daily.   Yes Historical Provider, MD  sertraline (ZOLOFT) 100 MG tablet Take 200 mg by mouth daily.   Yes Historical Provider, MD  tamsulosin (FLOMAX) 0.4 MG CAPS capsule Take 1 capsule (0.4 mg total) by mouth daily after supper. 08/05/14  Yes Huey Bienenstock, MD  warfarin (COUMADIN) 10 MG tablet Take 1 tablet (10 mg total) by mouth daily at 6 PM. 08/05/14  Yes Huey Bienenstock, MD  oxyCODONE-acetaminophen (PERCOCET) 5-325 MG per tablet Take 1-2 tablets by mouth every 4 (four) hours as needed. 08/07/14   Toy Cookey, MD   BP 132/76 mmHg  Pulse 80  Temp(Src) 98.5 F (36.9 C) (Oral)  Resp 31  SpO2 96% Physical Exam  Constitutional: He is oriented to person, place, and time. He appears well-developed and well-nourished. No distress.  HENT:  Head: Normocephalic and atraumatic.  Mouth/Throat: No oropharyngeal exudate.  Eyes: Pupils are equal, round, and reactive to light.  Neck: Normal range of motion. Neck supple.  Cardiovascular: Normal rate, regular  rhythm and normal heart sounds.  Exam reveals no gallop and no friction rub.   No murmur heard. Pulmonary/Chest: Effort normal and breath sounds normal. No respiratory distress. He has no wheezes. He has no rales.  Abdominal: Soft. Bowel sounds are normal. He exhibits no distension and no mass. There is no tenderness. There is no rebound and no guarding.  Musculoskeletal: Normal range of motion. He exhibits no edema or tenderness.       Legs: Neurological: He is alert and oriented to person, place, and time.  Skin: Skin is warm and dry.  Psychiatric: He has a normal mood and affect.    ED Course  Procedures (including critical care time) Labs Review Labs Reviewed  CBC WITH DIFFERENTIAL/PLATELET - Abnormal; Notable for the following:    RBC 3.13 (*)    Hemoglobin 8.5 (*)    HCT 28.3 (*)    Neutrophils Relative % 78 (*)    Lymphocytes Relative 8 (*)    Monocytes Absolute 1.1 (*)    All other components within normal limits  BRAIN NATRIURETIC PEPTIDE - Abnormal; Notable for the following:    B Natriuretic Peptide 188.7 (*)  All other components within normal limits  PROTIME-INR - Abnormal; Notable for the following:    Prothrombin Time 26.1 (*)    INR 2.38 (*)    All other components within normal limits  BASIC METABOLIC PANEL - Abnormal; Notable for the following:    Glucose, Bld 117 (*)    Calcium 8.2 (*)    GFR calc non Af Amer 76 (*)    GFR calc Af Amer 88 (*)    All other components within normal limits    Imaging Review No results found.   EKG Interpretation None     Per d/c summary "thrombectomy of right superficial femoral, popliteal, anterior tibial and peroneal artery, with above knee right popliteal artery exposure, findings were concerning for acute embolus, so patient was started on anticoagulation with heparin drip, weight to Lovenox being started on warfarin as well. Patient is being discharged on 2/9, with therapeutic INR of 2.0" MDM   Final diagnoses:    Post-op pain    Pt is a 49 y.o. male with Pmhx as above who presents with increased pain and swelling of right anterior thigh and leg since having from thrombectomy of right superficial femoral, popliteal, anterior tibial, and peroneal artery with above right knee popliteal artery exposure on 08/02/2013.  He states he has had some numbness and tingling in the foot since surgery, which is been unchanged.  He states the pain is worse with range of motion.  Denies chest pain, shortness of breath, fevers, chills, nausea, vomiting or diarrhea.  On physical exam, vital signs are stable.  Patient is in no acute distress.  He has slight D Harrison of the distal and of surgical wound which is otherwise intact.  He is tender edema of the anterior thigh and also the distal right lower extremity.  He reports sensation is decreased in the entirety of the leg compared to the left leg.  Motor is intact, though difficult due to pain, especially in the knee.   INR theraputic. No WBC elevation. Dr. Myra Gianotti from vascular surg has seen pt, does not feel he has an acute surgical complication. I feel he can be safely d/c to f/u as outpt with perocet for pain.   Ramsey Hattery evaluation in the Emergency Department is complete. It has been determined that no acute conditions requiring further emergency intervention are present at this time. The patient/guardian have been advised of the diagnosis and plan. We have discussed signs and symptoms that warrant return to the ED, such as changes or worsening in symptoms, worsening pain, swelling, fever.       Toy Cookey, MD 08/07/14 2051

## 2014-08-07 NOTE — ED Notes (Signed)
RN notes a surgical incision on the anterior aspect of his right thigh. Patient indicates that the pain is just above his right knee and in the medial aspect of his right thigh.  Patient states that it hurts to move or touch his leg.  RN notes that the lower  incision is pink. Patient states that he bumped it and it peeled off the scab.

## 2014-08-14 ENCOUNTER — Encounter: Payer: Self-pay | Admitting: Internal Medicine

## 2014-08-14 ENCOUNTER — Ambulatory Visit: Payer: Medicaid Other | Attending: Internal Medicine | Admitting: Internal Medicine

## 2014-08-14 VITALS — BP 150/90 | HR 82 | Temp 97.5°F | Resp 15 | Wt 383.6 lb

## 2014-08-14 DIAGNOSIS — Z8739 Personal history of other diseases of the musculoskeletal system and connective tissue: Secondary | ICD-10-CM

## 2014-08-14 DIAGNOSIS — N289 Disorder of kidney and ureter, unspecified: Secondary | ICD-10-CM | POA: Diagnosis not present

## 2014-08-14 DIAGNOSIS — Z7951 Long term (current) use of inhaled steroids: Secondary | ICD-10-CM | POA: Insufficient documentation

## 2014-08-14 DIAGNOSIS — R791 Abnormal coagulation profile: Secondary | ICD-10-CM | POA: Insufficient documentation

## 2014-08-14 DIAGNOSIS — I8290 Acute embolism and thrombosis of unspecified vein: Secondary | ICD-10-CM

## 2014-08-14 DIAGNOSIS — Z833 Family history of diabetes mellitus: Secondary | ICD-10-CM | POA: Diagnosis not present

## 2014-08-14 DIAGNOSIS — I743 Embolism and thrombosis of arteries of the lower extremities: Secondary | ICD-10-CM

## 2014-08-14 DIAGNOSIS — I10 Essential (primary) hypertension: Secondary | ICD-10-CM | POA: Diagnosis not present

## 2014-08-14 DIAGNOSIS — R52 Pain, unspecified: Secondary | ICD-10-CM

## 2014-08-14 DIAGNOSIS — Z8639 Personal history of other endocrine, nutritional and metabolic disease: Secondary | ICD-10-CM

## 2014-08-14 DIAGNOSIS — M1000 Idiopathic gout, unspecified site: Secondary | ICD-10-CM

## 2014-08-14 DIAGNOSIS — F329 Major depressive disorder, single episode, unspecified: Secondary | ICD-10-CM | POA: Diagnosis not present

## 2014-08-14 DIAGNOSIS — R339 Retention of urine, unspecified: Secondary | ICD-10-CM | POA: Diagnosis not present

## 2014-08-14 LAB — POCT INR: INR: 6.9

## 2014-08-14 MED ORDER — GABAPENTIN 300 MG PO CAPS: 300.0000 mg | ORAL_CAPSULE | Freq: Three times a day (TID) | ORAL | Status: DC

## 2014-08-14 MED ORDER — TAMSULOSIN HCL 0.4 MG PO CAPS: 0.4000 mg | ORAL_CAPSULE | Freq: Every day | ORAL | Status: DC

## 2014-08-14 MED ORDER — WARFARIN SODIUM 5 MG PO TABS: 5.0000 mg | ORAL_TABLET | Freq: Every day | ORAL | Status: DC

## 2014-08-14 MED ORDER — METOPROLOL TARTRATE 25 MG PO TABS: 50.0000 mg | ORAL_TABLET | Freq: Two times a day (BID) | ORAL | Status: DC

## 2014-08-14 MED ORDER — AMLODIPINE BESYLATE 10 MG PO TABS: 10.0000 mg | ORAL_TABLET | Freq: Every day | ORAL | Status: DC

## 2014-08-14 MED ORDER — ALLOPURINOL 300 MG PO TABS: 300.0000 mg | ORAL_TABLET | Freq: Every day | ORAL | Status: DC

## 2014-08-14 NOTE — Progress Notes (Signed)
Patient here for follow up from the hospital Was admitted with a blood clot to his right leg Patient also has a history of HTN Requesting a refill on his coumadin

## 2014-08-14 NOTE — Patient Instructions (Signed)
Warfarin tablets What is this medicine? WARFARIN (WAR far in) is an anticoagulant. It is used to treat or prevent clots in the veins, arteries, lungs, or heart. This medicine may be used for other purposes; ask your health care provider or pharmacist if you have questions. COMMON BRAND NAME(S): Coumadin, Jantoven What should I tell my health care provider before I take this medicine? They need to know if you have any of these conditions: -alcoholism -anemia -bleeding disorders -cancer -diabetes -heart disease -high blood pressure -history of bleeding in the gastrointestinal tract -history of stroke or other brain injury or disease -kidney or liver disease -protein C deficiency -protein S deficiency -psychosis or dementia -recent injury, recent or planned surgery or procedure -an unusual or allergic reaction to warfarin, other medicines, foods, dyes, or preservatives -pregnant or trying to get pregnant -breast-feeding How should I use this medicine? Take this medicine by mouth with a glass of water. Follow the directions on the prescription label. You can take this medicine with or without food. Take your medicine at the same time each day. Do not take it more often than directed. Do not stop taking except on your doctor's advice. Stopping this medicine may increase your risk of a blood clot. Be sure to refill your prescription before you run out of medicine. If your doctor or healthcare professional calls to change your dose, write down the dose and any other instructions. Always read the dose and instructions back to him or her to make sure you understand them. Tell your doctor or healthcare professional what strength of tablets you have on hand. Ask how many tablets you should take to equal your new dose. Write the date on the new instructions and keep them near your medicine. If you are told to stop taking your medicine until your next blood test, call your doctor or healthcare  professional if you do not hear anything within 24 hours of the test to find out your new dose or when to restart your prior dose. A special MedGuide will be given to you by the pharmacist with each prescription and refill. Be sure to read this information carefully each time. Talk to your pediatrician regarding the use of this medicine in children. Special care may be needed. Overdosage: If you think you have taken too much of this medicine contact a poison control center or emergency room at once. NOTE: This medicine is only for you. Do not share this medicine with others. What if I miss a dose? It is important not to miss a dose. If you miss a dose, call your healthcare provider. Take the dose as soon as possible on the same day. If it is almost time for your next dose, take only that dose. Do not take double or extra doses to make up for a missed dose. What may interact with this medicine? Do not take this medicine with any of the following medications: -agents that prevent or dissolve blood clots -aspirin or other salicylates -danshen -dextrothyroxine -mifepristone -St. John's Wort -red yeast rice This medicine may also interact with the following medications: -acetaminophen -agents that lower cholesterol -alcohol -allopurinol -amiodarone -antibiotics or medicines for treating bacterial, fungal or viral infections -azathioprine -barbiturate medicines for inducing sleep or treating seizures -certain medicines for diabetes -certain medicines for heart rhythm problems -certain medicines for high blood pressure -chloral hydrate -cisapride -disulfiram -male hormones, including contraceptive or birth control pills -general anesthetics -herbal or dietary products like garlic, ginkgo, ginseng, green tea, or kava  kava -influenza virus vaccine -male hormones -medicines for mental depression or psychosis -medicines for some types of cancer -medicines for stomach  problems -methylphenidate -NSAIDs, medicines for pain and inflammation, like ibuprofen or naproxen -propoxyphene -quinidine, quinine -raloxifene -seizure or epilepsy medicine like carbamazepine, phenytoin, and valproic acid -steroids like cortisone and prednisone -tamoxifen -thyroid medicine -tramadol -vitamin c, vitamin e, and vitamin K -zafirlukast -zileuton This list may not describe all possible interactions. Give your health care provider a list of all the medicines, herbs, non-prescription drugs, or dietary supplements you use. Also tell them if you smoke, drink alcohol, or use illegal drugs. Some items may interact with your medicine. What should I watch for while using this medicine? Visit your doctor or health care professional for regular checks on your progress. You will need to have a blood test called a PT/INR regularly. The PT/INR blood test is done to make sure you are getting the right dose of this medicine. It is important to not miss your appointment for the blood tests. When you first start taking this medicine, these tests are done often. Once the correct dose is determined and you take your medicine properly, these tests can be done less often. Notify your doctor or health care professional and seek emergency treatment if you develop breathing problems; changes in vision; chest pain; severe, sudden headache; pain, swelling, warmth in the leg; trouble speaking; sudden numbness or weakness of the face, arm or leg. These can be signs that your condition has gotten worse. While you are taking this medicine, carry an identification card with your name, the name and dose of medicine(s) being used, and the name and phone number of your doctor or health care professional or person to contact in an emergency. Do not start taking or stop taking any medicines or over-the-counter medicines except on the advice of your doctor or health care professional. You should discuss your diet with  your doctor or health care professional. Do not make major changes in your diet. Vitamin K can affect how well this medicine works. Many foods contain vitamin K. It is important to eat a consistent amount of foods with vitamin K. Other foods with vitamin K that you should eat in consistent amounts are asparagus, basil, beef or pork liver, black eyed peas, broccoli, brussel sprouts, cabbage, chickpeas, cucumber with peel, green onions, green tea, okra, parsley, peas, thyme, and green leafy vegetables like beet greens, collard greens, endive, kale, mustard greens, spinach, turnip greens, watercress, or certain lettuces like green leaf or romaine. This medicine can cause birth defects or bleeding in an unborn child. Women of childbearing age should use effective birth control while taking this medicine. If a woman becomes pregnant while taking this medicine, she should discuss the potential risks and her options with her health care professional. Avoid sports and activities that might cause injury while you are using this medicine. Severe falls or injuries can cause unseen bleeding. Be careful when using sharp tools or knives. Consider using an Copy. Take special care brushing or flossing your teeth. Report any injuries, bruising, or red spots on the skin to your doctor or health care professional. If you have an illness that causes vomiting, diarrhea, or fever for more than a few days, contact your doctor. Also check with your doctor if you are unable to eat for several days. These problems can change the effect of this medicine. Even after you stop taking this medicine, it takes several days before your body recovers  its normal ability to clot blood. Ask your doctor or health care professional how long you need to be careful. If you are going to have surgery or dental work, tell your doctor or health care professional that you have been taking this medicine. What side effects may I notice from  receiving this medicine? Side effects that you should report to your doctor or health care professional as soon as possible: -back pain -chills -dizziness -fever -heavy menstrual bleeding or vaginal bleeding -painful, blue, or purple toes -painful, prolonged erection -signs and symptoms of bleeding such as bloody or black, tarry stools; red or dark-brown urine; spitting up blood or brown material that looks like coffee grounds; red spots on the skin; unusual bruising or bleeding from the eye, gums, or nose-skin rash, itching or skin damage -stomach pain -unusually weak or tired -yellowing of skin or eyes Side effects that usually do not require medical attention (report to your doctor or health care professional if they continue or are bothersome): -diarrhea -hair loss This list may not describe all possible side effects. Call your doctor for medical advice about side effects. You may report side effects to FDA at 1-800-FDA-1088. Where should I keep my medicine? Keep out of the reach of children. Store at room temperature between 15 and 30 degrees C (59 and 86 degrees F). Protect from light. Throw away any unused medicine after the expiration date. Do not flush down the toilet. NOTE: This sheet is a summary. It may not cover all possible information. If you have questions about this medicine, talk to your doctor, pharmacist, or health care provider.  2015, Elsevier/Gold Standard. (2013-01-02 12:17:56) DASH Eating Plan DASH stands for "Dietary Approaches to Stop Hypertension." The DASH eating plan is a healthy eating plan that has been shown to reduce high blood pressure (hypertension). Additional health benefits may include reducing the risk of type 2 diabetes mellitus, heart disease, and stroke. The DASH eating plan may also help with weight loss. WHAT DO I NEED TO KNOW ABOUT THE DASH EATING PLAN? For the DASH eating plan, you will follow these general guidelines:  Choose foods with a  percent daily value for sodium of less than 5% (as listed on the food label).  Use salt-free seasonings or herbs instead of table salt or sea salt.  Check with your health care provider or pharmacist before using salt substitutes.  Eat lower-sodium products, often labeled as "lower sodium" or "no salt added."  Eat fresh foods.  Eat more vegetables, fruits, and low-fat dairy products.  Choose whole grains. Look for the word "whole" as the first word in the ingredient list.  Choose fish and skinless chicken or [place] more often than red meat. Limit fish, poultry, and meat to 6 oz (170 g) each day.  Limit sweets, desserts, sugars, and sugary drinks.  Choose heart-healthy fats.  Limit cheese to 1 oz (28 g) per day.  Eat more home-cooked food and less restaurant, buffet, and fast food.  Limit fried foods.  Cook foods using methods other than frying.  Limit canned vegetables. If you do use them, rinse them well to decrease the sodium.  When eating at a restaurant, ask that your food be prepared with less salt, or no salt if possible. WHAT FOODS CAN I EAT? Seek help from a dietitian for individual calorie needs. Grains Whole grain or whole wheat bread. Brown rice. Whole grain or whole wheat pasta. Quinoa, bulgur, and whole grain cereals. Low-sodium cereals. Corn or whole wheat  flour tortillas. Whole grain cornbread. Whole grain crackers. Low-sodium crackers. Vegetables Fresh or frozen vegetables (raw, steamed, roasted, or grilled). Low-sodium or reduced-sodium tomato and vegetable juices. Low-sodium or reduced-sodium tomato sauce and paste. Low-sodium or reduced-sodium canned vegetables.  Fruits All fresh, canned (in natural juice), or frozen fruits. Meat and Other Protein Products Ground beef (85% or leaner), grass-fed beef, or beef trimmed of fat. Skinless chicken or [place]. Ground chicken or [place]. Pork trimmed of fat. All fish and seafood. Eggs. Dried beans, peas, or lentils.  Unsalted nuts and seeds. Unsalted canned beans. Dairy Low-fat dairy products, such as skim or 1% milk, 2% or reduced-fat cheeses, low-fat ricotta or cottage cheese, or plain low-fat yogurt. Low-sodium or reduced-sodium cheeses. Fats and Oils Tub margarines without trans fats. Light or reduced-fat mayonnaise and salad dressings (reduced sodium). Avocado. Safflower, olive, or canola oils. Natural peanut or almond butter. Other Unsalted popcorn and pretzels. The items listed above may not be a complete list of recommended foods or beverages. Contact your dietitian for more options. WHAT FOODS ARE NOT RECOMMENDED? Grains White bread. White pasta. White rice. Refined cornbread. Bagels and croissants. Crackers that contain trans fat. Vegetables Creamed or fried vegetables. Vegetables in a cheese sauce. Regular canned vegetables. Regular canned tomato sauce and paste. Regular tomato and vegetable juices. Fruits Dried fruits. Canned fruit in light or heavy syrup. Fruit juice. Meat and Other Protein Products Fatty cuts of meat. Ribs, chicken wings, bacon, sausage, bologna, salami, chitterlings, fatback, hot dogs, bratwurst, and packaged luncheon meats. Salted nuts and seeds. Canned beans with salt. Dairy Whole or 2% milk, cream, half-and-half, and cream cheese. Whole-fat or sweetened yogurt. Full-fat cheeses or blue cheese. Nondairy creamers and whipped toppings. Processed cheese, cheese spreads, or cheese curds. Condiments Onion and garlic salt, seasoned salt, table salt, and sea salt. Canned and packaged gravies. Worcestershire sauce. Tartar sauce. Barbecue sauce. Teriyaki sauce. Soy sauce, including reduced sodium. Steak sauce. Fish sauce. Oyster sauce. Cocktail sauce. Horseradish. Ketchup and mustard. Meat flavorings and tenderizers. Bouillon cubes. Hot sauce. Tabasco sauce. Marinades. Taco seasonings. Relishes. Fats and Oils Butter, stick margarine, lard, shortening, ghee, and bacon fat. Coconut,  palm kernel, or palm oils. Regular salad dressings. Other Pickles and olives. Salted popcorn and pretzels. The items listed above may not be a complete list of foods and beverages to avoid. Contact your dietitian for more information. WHERE CAN I FIND MORE INFORMATION? National Heart, Lung, and Blood Institute: CablePromo.itwww.nhlbi.nih.gov/health/health-topics/topics/dash/ Document Released: 06/02/2011 Document Revised: 10/28/2013 Document Reviewed: 04/17/2013 [facility] Patient Information 2015 [place], [place]. This information is not intended to replace advice given to you by your health care provider. Make sure you discuss any questions you have with your health care provider.

## 2014-08-14 NOTE — Progress Notes (Signed)
Patient Demographics  Rickey Calderon, is a 49 y.o. male  WUJ:[id number]  [id number]  DOB - 03/08/1966  CC:  Chief Complaint  Patient presents with  . Hospitalization Follow-up       HPI: Rickey Calderon is a 49 y.o. 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, vascular surgery on board patient had a right lower extremity angiogram with thrombectomy of the right superficial femoral popliteal anterior tibial) artery with a bony right popliteal artery exposure findings were concerning for acute embolus suppression was started on interrogation with heparin drip and subsequently discharged on Coumadin at that time his INR was therapeutic , patient also has urinary retention is currently on Flomax Proscar, patient also had some renal insufficiency which improved after hydration his hydrochlorothiazide and lisinopril was held on discharge, continued  metoprolol, he was advised to follow with vascular surgery to determine the length of anticoagulation. Today his INR is elevated, he denies any bleeding. Patient has No headache, No chest pain, No abdominal pain - No Nausea, No new weakness tingling or numbness, No Cough - SOB.  No Known Allergies Past Medical History  Diagnosis Date  . Hypertension   . Gout   . Prostatitis   . Bronchitis   . Depression   . Seasonal allergies    Current Outpatient Prescriptions on File Prior to Visit  Medication Sig Dispense Refill  . desloratadine (CLARINEX) 5 MG tablet Take 5 mg by mouth daily.    Marland Kitchen docusate sodium (COLACE) 100 MG capsule Take 1 capsule (100 mg total) by mouth daily. 10 capsule 0  . doxazosin (CARDURA) 4 MG tablet Take 4 mg by mouth daily.    . Fluticasone-Salmeterol (ADVAIR) 100-50  MCG/DOSE AEPB Inhale 1 puff into the lungs 2 (two) times daily.    . mometasone (NASONEX) 50 MCG/ACT nasal spray Place 1 spray into the nose 2 (two) times daily.    . Multiple Vitamin (MULTIVITAMIN WITH MINERALS) TABS tablet Take 1 tablet by mouth daily.    Marland Kitchen oxyCODONE (OXY IR/ROXICODONE) 5 MG immediate release tablet Take 5-10 mg by mouth every 6 (six) hours as needed for severe pain.    Marland Kitchen oxyCODONE-acetaminophen (PERCOCET) 5-325 MG per tablet Take 1-2 tablets by mouth every 4 (four) hours as needed. 20 tablet 0  . potassium chloride SA (K-DUR,KLOR-CON) 20 MEQ tablet Take 20 mEq by mouth daily.    . sertraline (ZOLOFT) 100 MG tablet Take 200 mg by mouth daily.     No current facility-administered medications on file prior to visit.   Family History  Problem Relation Age of Onset  . Diabetes Father   . Hyperlipidemia Father    History   Social History  . Marital Status: Married    Spouse Name: N/A  . Number of Children: N/A  . Years of Education: N/A   Occupational History  . Not on file.   Social History Main Topics  . Smoking status: Never Smoker   . Smokeless tobacco: Not on file  . Alcohol Use: No  . Drug Use: Not on file  . Sexual Activity: Not on file   Other Topics Concern  . Not on file   Social History Narrative    Review of Systems: Constitutional: Negative for fever, chills, diaphoresis, activity change,  appetite change and fatigue. HENT: Negative for ear pain, nosebleeds, congestion, facial swelling, rhinorrhea, neck pain, neck stiffness and ear discharge.  Eyes: Negative for pain, discharge, redness, itching and visual disturbance. Respiratory: Negative for cough, choking, chest tightness, shortness of breath, wheezing and stridor.  Cardiovascular: Negative for chest pain, palpitations and leg swelling. Gastrointestinal: Negative for abdominal distention. Genitourinary: Negative for dysuria, urgency, frequency, hematuria, flank pain, decreased urine volume,  difficulty urinating and dyspareunia.  Musculoskeletal: Negative for back pain, joint swelling, arthralgia and gait problem. Neurological: Negative for dizziness, tremors, seizures, syncope, facial asymmetry, speech difficulty, weakness, light-headedness, numbness and headaches.  Hematological: Negative for adenopathy. Does not bruise/bleed easily. Psychiatric/Behavioral: Negative for hallucinations, behavioral problems, confusion, dysphoric mood, decreased concentration and agitation.    Objective:   Filed Vitals:   08/14/14 1535  BP: 150/90  Pulse: 82  Temp: 97.5 F (36.4 C)  Resp: 15    Physical Exam: Constitutional: Patient appears well-developed and well-nourished. No distress. HENT: Normocephalic, atraumatic, External right and left ear normal. Oropharynx is clear and moist.  Eyes: Conjunctivae and EOM are normal. PERRLA, no scleral icterus. Neck: Normal ROM. Neck supple. No JVD. No tracheal deviation. No thyromegaly. CVS: RRR, S1/S2 +, no murmurs, no gallops, no carotid bruit.  Pulmonary: Effort and breath sounds normal, no stridor, rhonchi, wheezes, rales.  Abdominal: Soft. BS +, no distension, tenderness, rebound or guarding.  Musculoskeletal: Normal range of motion. No edema and no tenderness.  Neuro: Alert. Normal reflexes, muscle tone coordination. No cranial nerve deficit. Skin: Skin is warm and dry. No rash noted. Not diaphoretic. No erythema. No pallor. Psychiatric: Normal mood and affect. Behavior, judgment, thought content normal.  Lab Results  Component Value Date   WBC 9.9 08/07/2014   HGB 8.5* 08/07/2014   HCT 28.3* 08/07/2014   MCV 90.4 08/07/2014   PLT 269 08/07/2014   Lab Results  Component Value Date   CREATININE 1.11 08/07/2014   BUN 14 08/07/2014   NA 141 08/07/2014   K 4.3 08/07/2014   CL 103 08/07/2014   CO2 28 08/07/2014    No results found for: HGBA1C Lipid Panel  No results found for: CHOL, TRIG, HDL, CHOLHDL, VLDL, LDLCALC       Assessment and plan:   1. 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  Dispense: 30 tablet; Refill: 2  2. Essential hypertension Advised patient for DASH diet, resume back on Lopressor and amlodipine. - metoprolol tartrate (LOPRESSOR) 25 MG tablet; Take 2 tablets (50 mg total) by mouth 2 (two) times daily.  Dispense: 120 tablet; Refill: 3 - amLODipine (NORVASC) 10 MG tablet; Take 1 tablet (10 mg total) by mouth daily.  Dispense: 30 tablet; Refill: 3 - COMPLETE METABOLIC PANEL WITH GFR  3. Idiopathic gout, unspecified chronicity, unspecified site  - allopurinol (ZYLOPRIM) 300 MG tablet; Take 1 tablet (300 mg total) by mouth daily.  Dispense: 30 tablet; Refill: 3  5. Renal insufficiency Will repeat blood chemistry. - COMPLETE METABOLIC PANEL WITH GFR  6. Family history of diabetes mellitus (DM)  - Hemoglobin A1c  7. History of gout Will check uric this  level. - Vit D  25 hydroxy (rtn osteoporosis monitoring) - Uric Acid  8. Pain I have advised patient to avoid NSAIDs, since patient is on blood thinners. I have prescribed Neurontin to take for pain. - gabapentin (NEURONTIN) 300 MG capsule; Take 1 capsule (300 mg total) by mouth 3 (three) times daily.  Dispense: 90 capsule; Refill: 3        Health Maintenance  -Vaccinations:  Up-to-date with flu shot  Return in about 3 months (around 11/12/2014) for hypertension, INR check on 08/18/2014.    The patient was given clear instructions to go to ER or return to medical center if symptoms don't improve, worsen or new problems develop. The patient verbalized understanding. The patient was told to call to get lab  results if they haven't heard anything in the next week.    This note has been created with Education officer, environmentalDragon speech recognition software and smart phrase technology. Any transcriptional errors are unintentional.   Doris CheadleADVANI, Anmol Fleck, MD

## 2014-08-15 ENCOUNTER — Encounter: Payer: Self-pay | Admitting: Internal Medicine

## 2014-08-15 ENCOUNTER — Telehealth: Payer: Self-pay

## 2014-08-15 ENCOUNTER — Inpatient Hospital Stay: Payer: Self-pay | Admitting: Internal Medicine

## 2014-08-15 LAB — COMPLETE METABOLIC PANEL WITH GFR
ALBUMIN: 3.5 g/dL (ref 3.5–5.2)
ALK PHOS: 73 U/L (ref 39–117)
ALT: 25 U/L (ref 0–53)
AST: 26 U/L (ref 0–37)
BUN: 18 mg/dL (ref 6–23)
CHLORIDE: 100 meq/L (ref 96–112)
CO2: 31 meq/L (ref 19–32)
Calcium: 8.5 mg/dL (ref 8.4–10.5)
Creat: 1.11 mg/dL (ref 0.50–1.35)
GFR, EST NON AFRICAN AMERICAN: 78 mL/min
GFR, Est African American: >89 mL/min
GLUCOSE: 91 mg/dL (ref 70–99)
POTASSIUM: 4.7 meq/L (ref 3.5–5.3)
SODIUM: 144 meq/L (ref 135–145)
TOTAL PROTEIN: 7 g/dL (ref 6.0–8.3)
Total Bilirubin: 0.9 mg/dL (ref 0.2–1.2)

## 2014-08-15 LAB — URIC ACID: Uric Acid, Serum: 5.8 mg/dL (ref 4.0–7.8)

## 2014-08-15 LAB — HEMOGLOBIN A1C
Hgb A1c MFr Bld: 5.4 % (ref <5.7)
Mean Plasma Glucose: 108 mg/dL (ref <117)

## 2014-08-15 LAB — VITAMIN D 25 HYDROXY (VIT D DEFICIENCY, FRACTURES): VIT D 25 HYDROXY: 36 ng/mL (ref 30–100)

## 2014-08-15 NOTE — Telephone Encounter (Signed)
-----   Message from Doris Cheadleeepak Advani, MD sent at 08/15/2014 12:34 PM EST ----- Call and let the Patient know that blood work is normal.

## 2014-08-15 NOTE — Telephone Encounter (Signed)
Patient not available Unable to leave message No voice mail set up 

## 2014-08-18 ENCOUNTER — Ambulatory Visit: Payer: Medicaid Other | Attending: Internal Medicine | Admitting: Pharmacist

## 2014-08-18 DIAGNOSIS — I8290 Acute embolism and thrombosis of unspecified vein: Secondary | ICD-10-CM

## 2014-08-18 LAB — POCT INR: INR: 4.2

## 2014-08-22 ENCOUNTER — Encounter: Payer: Self-pay | Admitting: Surgery

## 2014-08-22 ENCOUNTER — Ambulatory Visit: Payer: Medicaid Other | Attending: Internal Medicine | Admitting: Pharmacist

## 2014-08-22 DIAGNOSIS — I743 Embolism and thrombosis of arteries of the lower extremities: Secondary | ICD-10-CM

## 2014-08-22 DIAGNOSIS — N179 Acute kidney failure, unspecified: Secondary | ICD-10-CM

## 2014-08-22 DIAGNOSIS — I10 Essential (primary) hypertension: Secondary | ICD-10-CM

## 2014-08-22 LAB — POCT INR: INR: 2.7

## 2014-08-25 ENCOUNTER — Ambulatory Visit (HOSPITAL_COMMUNITY)
Admission: RE | Admit: 2014-08-25 | Discharge: 2014-08-25 | Disposition: A | Discharge status: Home / Self Care | Payer: Self-pay | Source: Ambulatory Visit | Attending: Surgery | Admitting: Surgery

## 2014-08-25 ENCOUNTER — Encounter: Payer: Self-pay | Admitting: Surgery

## 2014-08-25 ENCOUNTER — Ambulatory Visit (INDEPENDENT_AMBULATORY_CARE_PROVIDER_SITE_OTHER): Payer: Self-pay | Admitting: Surgery

## 2014-08-25 ENCOUNTER — Encounter (HOSPITAL_COMMUNITY): Payer: Self-pay

## 2014-08-25 DIAGNOSIS — I739 Peripheral vascular disease, unspecified: Secondary | ICD-10-CM

## 2014-08-25 DIAGNOSIS — Z01810 Encounter for preprocedural cardiovascular examination: Secondary | ICD-10-CM

## 2014-08-25 DIAGNOSIS — I743 Embolism and thrombosis of arteries of the lower extremities: Secondary | ICD-10-CM

## 2014-08-25 NOTE — Progress Notes (Signed)
The patient is back today for follow-up.  On 08/01/2014 he underwent thrombectomy of his right superficial femoral, popliteal, and anterior tibial and peroneal artery through a right above-knee popliteal artery exposure.  The patient had relatively fresh thrombus.  His symptoms improved.  He did have difficulty recovering given his morbid obesity.  He is back today for follow-up.  I did see him one time in the emergency department for pain.  Limited duplex ultrasound of his right leg was done which confirmed patency of the superficial femoral artery.  He has had skin separation of his incision which is noninfected and very superficial.  I debrided this a little bit today in the office.  He will continue with daily dressing changes.  The patient was given supplies to help with this.  He will follow-up in one month for a wound check.

## 2014-08-28 ENCOUNTER — Ambulatory Visit: Payer: Self-pay | Attending: Internal Medicine | Admitting: Pharmacist

## 2014-08-28 DIAGNOSIS — I743 Embolism and thrombosis of arteries of the lower extremities: Secondary | ICD-10-CM

## 2014-08-28 LAB — POCT INR: INR: 1.8

## 2014-09-02 ENCOUNTER — Encounter (HOSPITAL_COMMUNITY): Payer: Self-pay | Admitting: Surgery

## 2014-09-02 NOTE — OR Nursing (Signed)
Late entry, delay documentation.

## 2014-09-04 ENCOUNTER — Ambulatory Visit: Payer: MEDICAID | Attending: Internal Medicine | Admitting: Pharmacist

## 2014-09-04 DIAGNOSIS — I743 Embolism and thrombosis of arteries of the lower extremities: Secondary | ICD-10-CM

## 2014-09-04 DIAGNOSIS — N179 Acute kidney failure, unspecified: Secondary | ICD-10-CM

## 2014-09-04 DIAGNOSIS — I10 Essential (primary) hypertension: Secondary | ICD-10-CM

## 2014-09-04 LAB — POCT INR: INR: 1.3

## 2014-09-09 ENCOUNTER — Ambulatory Visit: Payer: Medicaid Other | Attending: Internal Medicine | Admitting: Pharmacist

## 2014-09-09 DIAGNOSIS — I8290 Acute embolism and thrombosis of unspecified vein: Secondary | ICD-10-CM

## 2014-09-09 LAB — POCT INR: INR: 1.5

## 2014-09-09 MED ORDER — WARFARIN SODIUM 5 MG PO TABS: 5.0000 mg | ORAL_TABLET | Freq: Every day | ORAL | Status: DC

## 2014-09-09 NOTE — Patient Instructions (Signed)
Take 7.5 mg coumadin on Tuesday and Thursday.  Take coumadin 5mg  the remainder of days Monday wed Friday sat Sunday and return in one week for repeat INR

## 2014-09-16 ENCOUNTER — Ambulatory Visit: Payer: Medicaid Other | Attending: Internal Medicine | Admitting: Pharmacist

## 2014-09-16 DIAGNOSIS — R52 Pain, unspecified: Secondary | ICD-10-CM

## 2014-09-16 DIAGNOSIS — I10 Essential (primary) hypertension: Secondary | ICD-10-CM

## 2014-09-16 DIAGNOSIS — N179 Acute kidney failure, unspecified: Secondary | ICD-10-CM

## 2014-09-16 DIAGNOSIS — I743 Embolism and thrombosis of arteries of the lower extremities: Secondary | ICD-10-CM

## 2014-09-16 DIAGNOSIS — I6340 Cerebral infarction due to embolism of unspecified cerebral artery: Secondary | ICD-10-CM

## 2014-09-16 LAB — POCT INR: INR: 2

## 2014-09-16 MED ORDER — GABAPENTIN 300 MG PO CAPS: 300.0000 mg | ORAL_CAPSULE | Freq: Three times a day (TID) | ORAL | Status: DC

## 2014-09-16 MED ORDER — AMLODIPINE BESYLATE 10 MG PO TABS: 10.0000 mg | ORAL_TABLET | Freq: Every day | ORAL | Status: DC

## 2014-09-16 MED ORDER — METOPROLOL TARTRATE 25 MG PO TABS: 50.0000 mg | ORAL_TABLET | Freq: Two times a day (BID) | ORAL | Status: DC

## 2014-09-22 ENCOUNTER — Encounter (HOSPITAL_COMMUNITY): Payer: Self-pay | Admitting: Emergency Medicine

## 2014-09-22 ENCOUNTER — Emergency Department (HOSPITAL_COMMUNITY)
Admission: EM | Admit: 2014-09-22 | Discharge: 2014-09-22 | Disposition: A | Discharge status: Home / Self Care | Payer: Medicaid Other | Attending: Emergency Medicine | Admitting: Emergency Medicine

## 2014-09-22 DIAGNOSIS — X58XXXA Exposure to other specified factors, initial encounter: Secondary | ICD-10-CM | POA: Diagnosis not present

## 2014-09-22 DIAGNOSIS — I10 Essential (primary) hypertension: Secondary | ICD-10-CM | POA: Diagnosis not present

## 2014-09-22 DIAGNOSIS — M109 Gout, unspecified: Secondary | ICD-10-CM | POA: Insufficient documentation

## 2014-09-22 DIAGNOSIS — Y9389 Activity, other specified: Secondary | ICD-10-CM | POA: Insufficient documentation

## 2014-09-22 DIAGNOSIS — Z79899 Other long term (current) drug therapy: Secondary | ICD-10-CM | POA: Diagnosis not present

## 2014-09-22 DIAGNOSIS — Z8659 Personal history of other mental and behavioral disorders: Secondary | ICD-10-CM | POA: Diagnosis not present

## 2014-09-22 DIAGNOSIS — Z87448 Personal history of other diseases of urinary system: Secondary | ICD-10-CM | POA: Insufficient documentation

## 2014-09-22 DIAGNOSIS — S86912A Strain of unspecified muscle(s) and tendon(s) at lower leg level, left leg, initial encounter: Secondary | ICD-10-CM | POA: Diagnosis not present

## 2014-09-22 DIAGNOSIS — S8992XA Unspecified injury of left lower leg, initial encounter: Secondary | ICD-10-CM | POA: Diagnosis present

## 2014-09-22 DIAGNOSIS — Y998 Other external cause status: Secondary | ICD-10-CM | POA: Diagnosis not present

## 2014-09-22 DIAGNOSIS — T148XXA Other injury of unspecified body region, initial encounter: Secondary | ICD-10-CM

## 2014-09-22 DIAGNOSIS — Z7901 Long term (current) use of anticoagulants: Secondary | ICD-10-CM | POA: Insufficient documentation

## 2014-09-22 DIAGNOSIS — Z8709 Personal history of other diseases of the respiratory system: Secondary | ICD-10-CM | POA: Insufficient documentation

## 2014-09-22 DIAGNOSIS — M79605 Pain in left leg: Secondary | ICD-10-CM

## 2014-09-22 DIAGNOSIS — Y9289 Other specified places as the place of occurrence of the external cause: Secondary | ICD-10-CM | POA: Diagnosis not present

## 2014-09-22 LAB — BASIC METABOLIC PANEL
Anion gap: 10 (ref 5–15)
BUN: 19 mg/dL (ref 6–23)
CALCIUM: 8.7 mg/dL (ref 8.4–10.5)
CO2: 26 mmol/L (ref 19–32)
Chloride: 102 mmol/L (ref 96–112)
Creatinine, Ser: 1.12 mg/dL (ref 0.50–1.35)
GFR, EST AFRICAN AMERICAN: 87 mL/min — AB (ref >90)
GFR, EST NON AFRICAN AMERICAN: 75 mL/min — AB (ref >90)
Glucose, Bld: 119 mg/dL — ABNORMAL HIGH (ref 70–99)
Potassium: 3.6 mmol/L (ref 3.5–5.1)
SODIUM: 138 mmol/L (ref 135–145)

## 2014-09-22 LAB — CBC
HEMATOCRIT: 38 % — AB (ref 39.0–52.0)
Hemoglobin: 11.3 g/dL — ABNORMAL LOW (ref 13.0–17.0)
MCH: 25.3 pg — ABNORMAL LOW (ref 26.0–34.0)
MCHC: 29.7 g/dL — AB (ref 30.0–36.0)
MCV: 85.2 fL (ref 78.0–100.0)
Platelets: 299 10*3/uL (ref 150–400)
RBC: 4.46 MIL/uL (ref 4.22–5.81)
RDW: 15.9 % — ABNORMAL HIGH (ref 11.5–15.5)
WBC: 8.4 10*3/uL (ref 4.0–10.5)

## 2014-09-22 LAB — PROTIME-INR
INR: 1.6 — AB (ref 0.00–1.49)
PROTHROMBIN TIME: 19.2 s — AB (ref 11.6–15.2)
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
## 2014-09-22 NOTE — ED Notes (Signed)
Patient given taxi voucher. 

## 2014-09-22 NOTE — ED Notes (Signed)
PA at bedside.

## 2014-09-22 NOTE — Discharge Instructions (Signed)
Please call your doctor for a followup appointment within 24-48 hours. When you talk to your doctor please let them know that you were seen in the emergency department and have them acquire all of your records so that they can discuss the findings with you and formulate a treatment plan to fully care for your new and ongoing problems. Please call and set-up an appointment with your primary care provider Please rest and stay hydrated Please avoid any physical or strenuous activity  Please apply warm compressions and massage with icy hot ointment  Please no heavy lifting Please continue to monitor symptoms closely and if symptoms are to worsen or change (fever greater than 101, chills, sweating, nausea, vomiting, chest pain, shortness of breathe, difficulty breathing, weakness, numbness, tingling, worsening or changes to pain pattern, fall, injury, trauma, loss of sensation, red streaks, swelling to the legs, inability to apply pressure, back pain, stomach pain) please report back to the Emergency Department immediately.   Muscle Strain A muscle strain is an injury that occurs when a muscle is stretched beyond its normal length. Usually a small number of muscle fibers are torn when this happens. Muscle strain is rated in degrees. First-degree strains have the least amount of muscle fiber tearing and pain. Second-degree and third-degree strains have increasingly more tearing and pain.  Usually, recovery from muscle strain takes 1-2 weeks. Complete healing takes 5-6 weeks.  CAUSES  Muscle strain happens when a sudden, violent force placed on a muscle stretches it too far. This may occur with lifting, sports, or a fall.  RISK FACTORS Muscle strain is especially common in athletes.  SIGNS AND SYMPTOMS At the site of the muscle strain, there may be:  Pain.  Bruising.  Swelling.  Difficulty using the muscle due to pain or lack of normal function. DIAGNOSIS  Your health care provider will perform a  physical exam and ask about your medical history. TREATMENT  Often, the best treatment for a muscle strain is resting, icing, and applying cold compresses to the injured area.  HOME CARE INSTRUCTIONS   Use the PRICE method of treatment to promote muscle healing during the first 2-3 days after your injury. The PRICE method involves:  Protecting the muscle from being injured again.  Restricting your activity and resting the injured body part.  Icing your injury. To do this, put ice in a plastic bag. Place a towel between your skin and the bag. Then, apply the ice and leave it on from 15-20 minutes each hour. After the third day, switch to moist heat packs.  Apply compression to the injured area with a splint or elastic bandage. Be careful not to wrap it too tightly. This may interfere with blood circulation or increase swelling.  Elevate the injured body part above the level of your heart as often as you can.  Only take over-the-counter or prescription medicines for pain, discomfort, or fever as directed by your health care provider.  Warming up prior to exercise helps to prevent future muscle strains. SEEK MEDICAL CARE IF:   You have increasing pain or swelling in the injured area.  You have numbness, tingling, or a significant loss of strength in the injured area. MAKE SURE YOU:   Understand these instructions.  Will watch your condition.  Will get help right away if you are not doing well or get worse. Document Released: 06/13/2005 Document Revised: 04/03/2013 Document Reviewed: 01/10/2013 [facility] Patient Information 2015 [place], [place]. This information is not intended to replace advice  given to you by your health care provider. Make sure you discuss any questions you have with your health care provider. ° °
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
## 2014-09-22 NOTE — ED Notes (Signed)
Pt ambulated in hallways to restroom. Pt tolerated well.

## 2014-09-23 ENCOUNTER — Ambulatory Visit: Payer: Medicaid Other | Attending: Internal Medicine | Admitting: Pharmacist

## 2014-09-23 DIAGNOSIS — I749 Embolism and thrombosis of unspecified artery: Secondary | ICD-10-CM | POA: Diagnosis not present

## 2014-09-23 LAB — POCT INR: INR: 1.7

## 2014-09-23 NOTE — Patient Instructions (Signed)
Take 7.5 mg coumadin every Tuesday and 5mg  the rest of the week Return to [facility] in two weeks for repeat INR

## 2014-10-03 ENCOUNTER — Encounter: Payer: Self-pay | Admitting: Surgery

## 2014-10-06 ENCOUNTER — Encounter: Payer: Self-pay | Admitting: Surgery

## 2014-10-06 ENCOUNTER — Ambulatory Visit (INDEPENDENT_AMBULATORY_CARE_PROVIDER_SITE_OTHER): Payer: Self-pay | Admitting: Surgery

## 2014-10-06 VITALS — BP 131/74 | HR 74 | Ht 67.0 in | Wt 362.0 lb

## 2014-10-06 DIAGNOSIS — I743 Embolism and thrombosis of arteries of the lower extremities: Secondary | ICD-10-CM

## 2014-10-06 NOTE — Progress Notes (Signed)
Patient name: Rickey MazeLennard Victorino MRN: [id number] DOB: 21-Jul-1965 Sex: male     Chief Complaint  Patient presents with  . Re-evaluation    6 wk f/u R thigh wound     HISTORY OF PRESENT ILLNESS: The patient is back today for follow-up. On 08/01/2014 he underwent thrombectomy of his right superficial femoral, popliteal, and anterior tibial and peroneal artery through a right above-knee popliteal artery exposure. The patient had relatively fresh thrombus. His symptoms improved. He did have difficulty recovering given his morbid obesity.  At his last visit, I debrided some of his wound.  He has been doing daily dressing changes.  Unfortunately, he did have a fall where he did a split.  This was very uncomfortable.  He also comments on a wound on the left lateral side of his left leg  Past Medical History  Diagnosis Date  . Hypertension   . Gout   . Prostatitis   . Bronchitis   . Depression   . Seasonal allergies     Past Surgical History  Procedure Laterality Date  . Abdominal aortagram N/A 07/28/2014    Procedure: ABDOMINAL Ronny FlurryAORTAGRAM;  Surgeon: Chuck Hinthristopher S Dickson, MD;  Location: [facility] CATH LAB;  Service: Cardiovascular;  Laterality: N/A;  . Lower extremity angiogram Bilateral 07/28/2014    Procedure: LOWER EXTREMITY ANGIOGRAM;  Surgeon: Chuck Hinthristopher S Dickson, MD;  [facility];  Service: Cardiovascular;  Laterality: Bilateral;  . Thrombectomy femoral artery Right 08/01/2014    Procedure: THROMBECTOMY RIGHT LEG SFA, Anterior tibial, and perineal arteries with intraoperative arteriograms;  Surgeon: Nada LibmanVance W Brabham, MD;  [facility];  Service: Vascular;  Laterality: Right;    History   Social History  . Marital Status: Married    Spouse Name: N/A  . Number of Children: N/A  . Years of Education: N/A   Occupational History  . Not on file.   Social History Main Topics  . Smoking status: Never Smoker   . Smokeless tobacco: Not on file  . Alcohol Use: No  . Drug Use:  Not on file  . Sexual Activity: Not on file   Other Topics Concern  . Not on file   Social History Narrative    Family History  Problem Relation Age of Onset  . Diabetes Father   . Hyperlipidemia Father     Allergies as of 10/06/2014  . (No Known Allergies)    Current Outpatient Prescriptions on File Prior to Visit  Medication Sig Dispense Refill  . allopurinol (ZYLOPRIM) 300 MG tablet Take 1 tablet (300 mg total) by mouth daily. 30 tablet 3  . amLODipine (NORVASC) 10 MG tablet Take 1 tablet (10 mg total) by mouth daily. 30 tablet 3  . desloratadine (CLARINEX) 5 MG tablet Take 5 mg by mouth daily.    . Fluticasone-Salmeterol (ADVAIR) 100-50 MCG/DOSE AEPB Inhale 1 puff into the lungs 2 (two) times daily.    Marland Kitchen. gabapentin (NEURONTIN) 300 MG capsule Take 1 capsule (300 mg total) by mouth 3 (three) times daily. 90 capsule 3  . metoprolol tartrate (LOPRESSOR) 25 MG tablet Take 2 tablets (50 mg total) by mouth 2 (two) times daily. (Patient taking differently: Take 25 mg by mouth 2 (two) times daily. ) 120 tablet 3  . Multiple Vitamin (MULTIVITAMIN WITH MINERALS) TABS tablet Take 1 tablet by mouth daily.    Marland Kitchen. oxyCODONE-acetaminophen (PERCOCET) 5-325 MG per tablet Take 1-2 tablets by mouth every 4 (four) hours as needed. 20 tablet 0  .  potassium chloride SA (K-DUR,KLOR-CON) 20 MEQ tablet Take 20 mEq by mouth daily.    . sertraline (ZOLOFT) 100 MG tablet Take 200 mg by mouth daily.    . tamsulosin (FLOMAX) 0.4 MG CAPS capsule Take 1 capsule (0.4 mg total) by mouth daily after supper. 30 capsule 3  . warfarin (COUMADIN) 5 MG tablet Take 1 tablet (5 mg total) by mouth daily at 6 PM. 30 tablet 2   No current facility-administered medications on file prior to visit.     REVIEW OF SYSTEMS: Please see history of present illness  PHYSICAL EXAMINATION:   Vital signs are  Filed Vitals:   10/06/14 1444 10/06/14 1447  BP: 139/94 131/74  Pulse: 72 74  Height:  (1.702 m)   Weight: 362  lb (164.202 kg)   SpO2: 97%    Body mass index is 56.68 kg/(m^2). General: The patient appears their stated age. HEENT:  No gross abnormalities Pulmonary:  Non labored breathing Abdomen: Soft and non-tender Musculoskeletal: There are no major deformities. Neurologic: No focal weakness or paresthesias are detected, Skin: The right above-knee incision has nearly healed with the exception of one small area about 1 cm in length.  He has a new dry eschar on the lateral side of his left leg just proximal to the knee.  There is no evidence of infection here. Psychiatric: The patient has normal affect. Cardiovascular: Brisk dorsalis pedis and posterior tibial Doppler signals on the right   Diagnostic Studies None  Assessment: Status post right leg thrombectomy Plan: The patient is doing very well.  I applied silver nitrate to the incision on the right ear this should heal within the next several weeks.  I told him to monitor the wound on the lateral side of his left leg which appears to be pressure related.  He will follow up in 6 months with ultrasound of his right leg and ABIs.  He will continue anticoagulation  V. Charlena Cross, M.D. [facility] Office: [phone] Pager:  [phone]

## 2014-10-07 ENCOUNTER — Ambulatory Visit: Payer: Medicaid Other | Attending: Internal Medicine | Admitting: Pharmacist

## 2014-10-07 DIAGNOSIS — N179 Acute kidney failure, unspecified: Secondary | ICD-10-CM

## 2014-10-07 DIAGNOSIS — I743 Embolism and thrombosis of arteries of the lower extremities: Secondary | ICD-10-CM

## 2014-10-07 DIAGNOSIS — I10 Essential (primary) hypertension: Secondary | ICD-10-CM

## 2014-10-07 LAB — POCT INR: INR: 2

## 2014-10-07 NOTE — Addendum Note (Signed)
Addended by: Dannielle KarvonenMANESS-HARRISON, Alison Breeding C on: 10/07/2014 03:52 PM   Modules accepted: Orders

## 2014-10-28 ENCOUNTER — Ambulatory Visit: Payer: Medicaid Other | Attending: Internal Medicine | Admitting: Pharmacist

## 2014-10-28 DIAGNOSIS — I10 Essential (primary) hypertension: Secondary | ICD-10-CM

## 2014-10-28 DIAGNOSIS — N179 Acute kidney failure, unspecified: Secondary | ICD-10-CM

## 2014-10-28 DIAGNOSIS — I743 Embolism and thrombosis of arteries of the lower extremities: Secondary | ICD-10-CM

## 2014-10-28 LAB — POCT INR: INR: 2.3

## 2014-11-10 ENCOUNTER — Telehealth: Payer: Self-pay | Admitting: Pharmacist

## 2014-11-10 ENCOUNTER — Other Ambulatory Visit: Payer: Self-pay | Admitting: Pharmacist

## 2014-11-10 DIAGNOSIS — I743 Embolism and thrombosis of arteries of the lower extremities: Secondary | ICD-10-CM

## 2014-11-10 MED ORDER — RIVAROXABAN 20 MG PO TABS: 20.0000 mg | ORAL_TABLET | Freq: Every day | ORAL | Status: DC

## 2014-11-10 NOTE — Telephone Encounter (Signed)
After speaking with Dr. Orpah CobbAdvani, the patient will be started on Xarelto 20 mg.  I called him today to let him know and that he should hold his warfarin dose tonight since his INR needs to be <3.  He will come tomorrow for his appointment and we will check his INR then and switch him over to Xarelto.

## 2014-11-11 ENCOUNTER — Other Ambulatory Visit: Payer: Self-pay | Admitting: Pharmacist

## 2014-11-11 ENCOUNTER — Ambulatory Visit: Payer: Medicaid Other | Attending: Internal Medicine | Admitting: Pharmacist

## 2014-11-11 DIAGNOSIS — I743 Embolism and thrombosis of arteries of the lower extremities: Secondary | ICD-10-CM

## 2014-11-11 LAB — POCT INR: INR: 1.6

## 2014-11-11 NOTE — Progress Notes (Signed)
Pt is stopping warfarin and converting to Xarelto 20 mg.  Will recheck INR next week and see if there are any issues.

## 2014-11-17 ENCOUNTER — Other Ambulatory Visit: Payer: Self-pay

## 2014-11-17 MED ORDER — DESLORATADINE 5 MG PO TABS: 5.0000 mg | ORAL_TABLET | Freq: Every day | ORAL | Status: DC

## 2014-11-18 ENCOUNTER — Ambulatory Visit: Payer: Medicaid Other | Attending: Internal Medicine | Admitting: Pharmacist

## 2014-11-18 ENCOUNTER — Other Ambulatory Visit: Payer: Self-pay

## 2014-11-18 DIAGNOSIS — I743 Embolism and thrombosis of arteries of the lower extremities: Secondary | ICD-10-CM

## 2014-11-18 LAB — POCT INR: INR: 1.4

## 2014-11-18 MED ORDER — CETIRIZINE HCL 10 MG PO TABS: 10.0000 mg | ORAL_TABLET | Freq: Every day | ORAL | Status: DC

## 2014-11-19 ENCOUNTER — Telehealth: Payer: Self-pay | Admitting: Internal Medicine

## 2014-11-19 NOTE — Telephone Encounter (Signed)
Patient was contacted to consult with him about his schedule INR appointment; PharmD stated that patient is no longer in need of coumadin and believes patient may have been talking about a PCP appointment instead; if patient calls back please clarify

## 2014-12-08 ENCOUNTER — Other Ambulatory Visit: Payer: Self-pay | Admitting: Internal Medicine

## 2014-12-11 ENCOUNTER — Ambulatory Visit: Payer: Medicaid Other | Attending: Internal Medicine | Admitting: Internal Medicine

## 2014-12-11 ENCOUNTER — Encounter: Payer: Self-pay | Admitting: Internal Medicine

## 2014-12-11 VITALS — BP 130/80 | HR 68 | Temp 98.0°F | Resp 16 | Wt 361.8 lb

## 2014-12-11 DIAGNOSIS — Z7901 Long term (current) use of anticoagulants: Secondary | ICD-10-CM | POA: Diagnosis not present

## 2014-12-11 DIAGNOSIS — I10 Essential (primary) hypertension: Secondary | ICD-10-CM | POA: Diagnosis not present

## 2014-12-11 DIAGNOSIS — I743 Embolism and thrombosis of arteries of the lower extremities: Secondary | ICD-10-CM

## 2014-12-11 DIAGNOSIS — M1000 Idiopathic gout, unspecified site: Secondary | ICD-10-CM

## 2014-12-11 DIAGNOSIS — Z7951 Long term (current) use of inhaled steroids: Secondary | ICD-10-CM | POA: Insufficient documentation

## 2014-12-11 DIAGNOSIS — Z86718 Personal history of other venous thrombosis and embolism: Secondary | ICD-10-CM | POA: Diagnosis not present

## 2014-12-11 DIAGNOSIS — F329 Major depressive disorder, single episode, unspecified: Secondary | ICD-10-CM | POA: Insufficient documentation

## 2014-12-11 NOTE — Patient Instructions (Signed)
DASH Eating Plan °DASH stands for "Dietary Approaches to Stop Hypertension." The DASH eating plan is a healthy eating plan that has been shown to reduce high blood pressure (hypertension). Additional health benefits may include reducing the risk of type 2 diabetes mellitus, heart disease, and stroke. The DASH eating plan may also help with weight loss. °WHAT DO I NEED TO KNOW ABOUT THE DASH EATING PLAN? °For the DASH eating plan, you will follow these general guidelines: °· Choose foods with a percent daily value for sodium of less than 5% (as listed on the food label). °· Use salt-free seasonings or herbs instead of table salt or sea salt. °· Check with your health care provider or pharmacist before using salt substitutes. °· Eat lower-sodium products, often labeled as "lower sodium" or "no salt added." °· Eat fresh foods. °· Eat more vegetables, fruits, and low-fat dairy products. °· Choose whole grains. Look for the word "whole" as the first word in the ingredient list. °· Choose fish and skinless chicken or turkey more often than red meat. Limit fish, poultry, and meat to 6 oz (170 g) each day. °· Limit sweets, desserts, sugars, and sugary drinks. °· Choose heart-healthy fats. °· Limit cheese to 1 oz (28 g) per day. °· Eat more home-cooked food and less restaurant, buffet, and fast food. °· Limit fried foods. °· Cook foods using methods other than frying. °· Limit canned vegetables. If you do use them, rinse them well to decrease the sodium. °· When eating at a restaurant, ask that your food be prepared with less salt, or no salt if possible. °WHAT FOODS CAN I EAT? °Seek help from a dietitian for individual calorie needs. °Grains °Whole grain or whole wheat bread. Brown rice. Whole grain or whole wheat pasta. Quinoa, bulgur, and whole grain cereals. Low-sodium cereals. Corn or whole wheat flour tortillas. Whole grain cornbread. Whole grain crackers. Low-sodium crackers. °Vegetables °Fresh or frozen vegetables  (raw, steamed, roasted, or grilled). Low-sodium or reduced-sodium tomato and vegetable juices. Low-sodium or reduced-sodium tomato sauce and paste. Low-sodium or reduced-sodium canned vegetables.  °Fruits °All fresh, canned (in natural juice), or frozen fruits. °Meat and Other Protein Products °Ground beef (85% or leaner), grass-fed beef, or beef trimmed of fat. Skinless chicken or turkey. Ground chicken or turkey. Pork trimmed of fat. All fish and seafood. Eggs. Dried beans, peas, or lentils. Unsalted nuts and seeds. Unsalted canned beans. °Dairy °Low-fat dairy products, such as skim or 1% milk, 2% or reduced-fat cheeses, low-fat ricotta or cottage cheese, or plain low-fat yogurt. Low-sodium or reduced-sodium cheeses. °Fats and Oils °Tub margarines without trans fats. Light or reduced-fat mayonnaise and salad dressings (reduced sodium). Avocado. Safflower, olive, or canola oils. Natural peanut or almond butter. °Other °Unsalted popcorn and pretzels. °The items listed above may not be a complete list of recommended foods or beverages. Contact your dietitian for more options. °WHAT FOODS ARE NOT RECOMMENDED? °Grains °White bread. White pasta. White rice. Refined cornbread. Bagels and croissants. Crackers that contain trans fat. °Vegetables °Creamed or fried vegetables. Vegetables in a cheese sauce. Regular canned vegetables. Regular canned tomato sauce and paste. Regular tomato and vegetable juices. °Fruits °Dried fruits. Canned fruit in light or heavy syrup. Fruit juice. °Meat and Other Protein Products °Fatty cuts of meat. Ribs, chicken wings, bacon, sausage, bologna, salami, chitterlings, fatback, hot dogs, bratwurst, and packaged luncheon meats. Salted nuts and seeds. Canned beans with salt. °Dairy °Whole or 2% milk, cream, half-and-half, and cream cheese. Whole-fat or sweetened yogurt. Full-fat   cheeses or blue cheese. Nondairy creamers and whipped toppings. Processed cheese, cheese spreads, or cheese  curds. °Condiments °Onion and garlic salt, seasoned salt, table salt, and sea salt. Canned and packaged gravies. Worcestershire sauce. Tartar sauce. Barbecue sauce. Teriyaki sauce. Soy sauce, including reduced sodium. Steak sauce. Fish sauce. Oyster sauce. Cocktail sauce. Horseradish. Ketchup and mustard. Meat flavorings and tenderizers. Bouillon cubes. Hot sauce. Tabasco sauce. Marinades. Taco seasonings. Relishes. °Fats and Oils °Butter, stick margarine, lard, shortening, ghee, and bacon fat. Coconut, palm kernel, or palm oils. Regular salad dressings. °Other °Pickles and olives. Salted popcorn and pretzels. °The items listed above may not be a complete list of foods and beverages to avoid. Contact your dietitian for more information. °WHERE CAN I FIND MORE INFORMATION? °National Heart, [employer], and [employer] Institute: www.nhlbi.nih.gov/health/health-topics/topics/dash/ °Document Released: 06/02/2011 Document Revised: 10/28/2013 Document Reviewed: 04/17/2013 °ExitCare® Patient Information ©2015 [employer]. This information is not intended to replace advice given to you by your health care provider. Make sure you discuss any questions you have with your health care provider. ° °

## 2014-12-11 NOTE — Progress Notes (Signed)
Patient states he is here for his routine follow up on his hypertension Patient states he does not need any refills at this time

## 2014-12-11 NOTE — Progress Notes (Signed)
MRN: [id number] Name: Rickey Calderon  Sex: male Age: 49 y.o. DOB: Nov 10, 1965  Allergies: Review of patient's allergies indicates no known allergies.  Chief Complaint  Patient presents with  . Follow-up    HPI: Patient is 49 y.o. 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  Past Medical History  Diagnosis Date  . Hypertension   . Gout   . Prostatitis   . Bronchitis   . Depression   . Seasonal allergies     Past Surgical History  Procedure Laterality Date  . Abdominal aortagram N/A 07/28/2014    Procedure: ABDOMINAL Ronny Flurry;  Surgeon: Chuck Hint, MD;  Location: [facility] CATH LAB;  Service: Cardiovascular;  Laterality: N/A;  . Lower extremity angiogram Bilateral 07/28/2014    Procedure: LOWER EXTREMITY ANGIOGRAM;  Surgeon: Chuck Hint, MD;  Location: [facility] CATH LAB;  Service: Cardiovascular;  Laterality: Bilateral;  . Thrombectomy femoral artery Right 08/01/2014    Procedure: THROMBECTOMY RIGHT LEG SFA, Anterior tibial, and perineal arteries with intraoperative arteriograms;  Surgeon: Nada Libman, MD;  Location: [facility] OR;  Service: Vascular;  Laterality: Right;      Medication List       This list is accurate as of: 12/11/14 12:44 PM.  Always use your most recent med list.               allopurinol 300 MG tablet  Commonly known as:  ZYLOPRIM  Take 1 tablet (300 mg total) by mouth daily.     amLODipine 10 MG tablet  Commonly known as:  NORVASC  Take 1 tablet (10 mg total) by mouth daily.     cetirizine 10 MG tablet  Commonly known as:  ZYRTEC  Take 1 tablet (10 mg total) by mouth  daily.     desloratadine 5 MG tablet  Commonly known as:  CLARINEX  Take 1 tablet (5 mg total) by mouth daily.     Fluticasone-Salmeterol 100-50 MCG/DOSE Aepb  Commonly known as:  ADVAIR  Inhale 1 puff into the lungs 2 (two) times daily.     gabapentin 300 MG capsule  Commonly known as:  NEURONTIN  Take 1 capsule (300 mg total) by mouth 3 (three) times daily.     metoprolol tartrate 25 MG tablet  Commonly known as:  LOPRESSOR  Take 2 tablets (50 mg total) by mouth 2 (two) times daily.     multivitamin with minerals Tabs tablet  Take 1 tablet by mouth daily.     oxyCODONE-acetaminophen 5-325 MG per tablet  Commonly known as:  PERCOCET  Take 1-2 tablets by mouth every 4 (four) hours as needed.     potassium chloride SA 20 MEQ tablet  Commonly known as:  K-DUR,KLOR-CON  Take 20 mEq by mouth daily.     rivaroxaban 20 MG Tabs tablet  Commonly known as:  XARELTO  Take 1 tablet (20 mg total) by mouth daily with supper.     sertraline 100 MG tablet  Commonly known as:  ZOLOFT  Take 200 mg by mouth daily.     tamsulosin 0.4 MG Caps capsule  Commonly known as:  FLOMAX  Take 1 capsule (0.4 mg total) by  mouth daily after supper.        No orders of the defined types were placed in this encounter.    Immunization History  Administered Date(s) Administered  . Influenza,inj,Quad PF,36+ Mos 07/26/2014    Family History  Problem Relation Age of Onset  . Diabetes Father   . Hyperlipidemia Father     History  Substance Use Topics  . Smoking status: Never Smoker   . Smokeless tobacco: Not on file  . Alcohol Use: No    Review of Systems   As noted in HPI  Filed Vitals:   12/11/14 1233  BP: 130/80  Pulse:   Temp:   Resp:     Physical Exam  Physical Exam  Constitutional:  Obese male resting comfortably not in acute distress  Eyes: EOM are normal. Pupils are equal, round, and reactive to light.  Cardiovascular: Normal rate and regular rhythm.     Pulmonary/Chest: No respiratory distress. He has no wheezes. He has no rales.  Musculoskeletal: He exhibits no edema.    CBC    Component Value Date/Time   WBC 8.4 09/22/2014 1718   RBC 4.46 09/22/2014 1718   HGB 11.3* 09/22/2014 1718   HCT 38.0* 09/22/2014 1718   PLT 299 09/22/2014 1718   MCV 85.2 09/22/2014 1718   LYMPHSABS 0.7 08/07/2014 1022   MONOABS 1.1* 08/07/2014 1022   EOSABS 0.3 08/07/2014 1022   BASOSABS 0.0 08/07/2014 1022    CMP     Component Value Date/Time   NA 138 09/22/2014 1718   K 3.6 09/22/2014 1718   CL 102 09/22/2014 1718   CO2 26 09/22/2014 1718   GLUCOSE 119* 09/22/2014 1718   BUN 19 09/22/2014 1718   CREATININE 1.12 09/22/2014 1718   CREATININE 1.11 08/14/2014 1630   CALCIUM 8.7 09/22/2014 1718   PROT 7.0 08/14/2014 1630   ALBUMIN 3.5 08/14/2014 1630   AST 26 08/14/2014 1630   ALT 25 08/14/2014 1630   ALKPHOS 73 08/14/2014 1630   BILITOT 0.9 08/14/2014 1630   GFRNONAA 75* 09/22/2014 1718   GFRNONAA 78 08/14/2014 1630   GFRAA 87* 09/22/2014 1718   GFRAA >89 08/14/2014 1630    No results found for: CHOL  Lab Results  Component Value Date/Time   HGBA1C 5.4 08/14/2014 04:30 PM    Lab Results  Component Value Date/Time   AST 26 08/14/2014 04:30 PM    Assessment and Plan  Essential hypertension Blood pressure is well controlled continue with metoprolol and amlodipine.  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  Idiopathic gout, unspecified chronicity, unspecified site Currently patient is on allopurinol .   Return in about 3 months (around 03/13/2015), or if symptoms worsen or fail to improve.   This note has been created with Education officer, environmental. Any transcriptional errors are unintentional.    Doris Cheadle, MD

## 2014-12-15 ENCOUNTER — Ambulatory Visit: Payer: Medicaid Other | Admitting: Internal Medicine

## 2015-01-06 ENCOUNTER — Other Ambulatory Visit: Payer: Self-pay

## 2015-01-06 DIAGNOSIS — M1000 Idiopathic gout, unspecified site: Secondary | ICD-10-CM

## 2015-01-06 MED ORDER — ALLOPURINOL 300 MG PO TABS: 300.0000 mg | ORAL_TABLET | Freq: Every day | ORAL | Status: DC

## 2015-01-06 MED ORDER — TAMSULOSIN HCL 0.4 MG PO CAPS: 0.4000 mg | ORAL_CAPSULE | Freq: Every day | ORAL | Status: DC

## 2015-03-23 ENCOUNTER — Ambulatory Visit: Payer: Medicaid Other | Admitting: Family Medicine

## 2015-03-27 ENCOUNTER — Other Ambulatory Visit: Payer: Self-pay | Admitting: Internal Medicine

## 2015-03-31 ENCOUNTER — Encounter: Payer: Self-pay | Admitting: Family Medicine

## 2015-03-31 ENCOUNTER — Ambulatory Visit: Payer: Medicaid Other | Attending: Family Medicine | Admitting: Family Medicine

## 2015-03-31 VITALS — BP 152/96 | HR 76 | Temp 97.9°F | Resp 16 | Ht 67.0 in | Wt 369.4 lb

## 2015-03-31 DIAGNOSIS — M545 Low back pain, unspecified: Secondary | ICD-10-CM

## 2015-03-31 DIAGNOSIS — Z0000 Encounter for general adult medical examination without abnormal findings: Secondary | ICD-10-CM | POA: Diagnosis present

## 2015-03-31 DIAGNOSIS — I10 Essential (primary) hypertension: Secondary | ICD-10-CM | POA: Insufficient documentation

## 2015-03-31 DIAGNOSIS — G8929 Other chronic pain: Secondary | ICD-10-CM | POA: Insufficient documentation

## 2015-03-31 DIAGNOSIS — N411 Chronic prostatitis: Secondary | ICD-10-CM | POA: Insufficient documentation

## 2015-03-31 DIAGNOSIS — I743 Embolism and thrombosis of arteries of the lower extremities: Secondary | ICD-10-CM | POA: Insufficient documentation

## 2015-03-31 DIAGNOSIS — M1000 Idiopathic gout, unspecified site: Secondary | ICD-10-CM | POA: Diagnosis not present

## 2015-03-31 LAB — CBC
HEMATOCRIT: 43.5 % (ref 39.0–52.0)
HEMOGLOBIN: 13.9 g/dL (ref 13.0–17.0)
MCH: 26.5 pg (ref 26.0–34.0)
MCHC: 32 g/dL (ref 30.0–36.0)
MCV: 83 fL (ref 78.0–100.0)
MPV: 9.3 fL (ref 8.6–12.4)
Platelets: 281 10*3/uL (ref 150–400)
RBC: 5.24 MIL/uL (ref 4.22–5.81)
RDW: 15.8 % — ABNORMAL HIGH (ref 11.5–15.5)
WBC: 8.7 10*3/uL (ref 4.0–10.5)

## 2015-03-31 LAB — LIPID PANEL
Cholesterol: 169 mg/dL (ref 125–200)
HDL: 54 mg/dL (ref >=40)
LDL Cholesterol: 97 mg/dL (ref <130)
Total CHOL/HDL Ratio: 3.1 Ratio (ref <=5.0)
Triglycerides: 88 mg/dL (ref <150)
VLDL: 18 mg/dL (ref <30)

## 2015-03-31 LAB — COMPLETE METABOLIC PANEL WITH GFR
ALBUMIN: 4 g/dL (ref 3.6–5.1)
ALK PHOS: 70 U/L (ref 40–115)
ALT: 17 U/L (ref 9–46)
AST: 18 U/L (ref 10–40)
BUN: 20 mg/dL (ref 7–25)
CALCIUM: 9.3 mg/dL (ref 8.6–10.3)
CHLORIDE: 99 mmol/L (ref 98–110)
CO2: 29 mmol/L (ref 20–31)
Creat: 1.27 mg/dL (ref 0.60–1.35)
GFR, EST NON AFRICAN AMERICAN: 66 mL/min (ref >=60)
GFR, Est African American: 76 mL/min (ref >=60)
Glucose, Bld: 95 mg/dL (ref 65–99)
POTASSIUM: 4.5 mmol/L (ref 3.5–5.3)
SODIUM: 141 mmol/L (ref 135–146)
Total Bilirubin: 0.8 mg/dL (ref 0.2–1.2)
Total Protein: 7.6 g/dL (ref 6.1–8.1)

## 2015-03-31 LAB — URIC ACID: Uric Acid, Serum: 6.5 mg/dL (ref 4.0–7.8)

## 2015-03-31 LAB — TSH: TSH: 1.481 u[IU]/mL (ref 0.350–4.500)

## 2015-03-31 MED ORDER — TAMSULOSIN HCL 0.4 MG PO CAPS: 0.4000 mg | ORAL_CAPSULE | Freq: Every day | ORAL | Status: DC

## 2015-03-31 MED ORDER — ALLOPURINOL 300 MG PO TABS: 300.0000 mg | ORAL_TABLET | Freq: Every day | ORAL | Status: DC

## 2015-03-31 MED ORDER — METOPROLOL TARTRATE 25 MG PO TABS
25.0000 mg | ORAL_TABLET | Freq: Two times a day (BID) | ORAL | Status: DC
Start: 2015-03-31 — End: 2015-07-10

## 2015-03-31 MED ORDER — ACETAMINOPHEN-CODEINE #3 300-30 MG PO TABS: 1.0000 | ORAL_TABLET | Freq: Three times a day (TID) | ORAL | Status: DC | PRN

## 2015-03-31 MED ORDER — RIVAROXABAN 20 MG PO TABS: 20.0000 mg | ORAL_TABLET | Freq: Every day | ORAL | Status: DC

## 2015-03-31 MED ORDER — CHLORTHALIDONE 25 MG PO TABS: 25.0000 mg | ORAL_TABLET | Freq: Every day | ORAL | Status: DC

## 2015-03-31 MED ORDER — GABAPENTIN 300 MG PO CAPS: 300.0000 mg | ORAL_CAPSULE | Freq: Three times a day (TID) | ORAL | Status: DC

## 2015-03-31 NOTE — Assessment & Plan Note (Signed)
A: chronic low back pain in setting of morbid obesity  P:  added tylenol #3 for pain control.  Nutrition referral  Encouraged weight loss

## 2015-03-31 NOTE — Progress Notes (Signed)
Patient here for follow up on his HTN and embolism to his right Lower extremity Patient is requesting a refill on his xarelto as well

## 2015-03-31 NOTE — Patient Instructions (Addendum)
Rickey Calderon,  Thank you for coming in today. It was a pleasure meeting you. I look forward to being your primary doctor.  Change today is stopping norvasc and replacing with chlorthalidone. This is to control BP and improve swelling. We will need to monitor uric acid given your hx of gout.   Also change today is added tylenol #3 for pain control.   F/u in 3-4 weeks for RN BP check and uric acid check F/u with me in 2 months for wellness physical and GI referral for screening colononscopy  Dr. Thalia Party was seen today for follow-up.  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  Morbid obesity, unspecified obesity type (HCC) -     Amb ref to Medical Nutrition Therapy-MNT -     TSH  Chronic low back pain -     acetaminophen-codeine (TYLENOL #3) 300-30 MG tablet; Take 1 tablet by mouth every 8 (eight) hours as needed. -     gabapentin (NEURONTIN) 300 MG capsule; Take 1 capsule (300 mg total) by mouth 3 (three) times daily.  Essential hypertension -     metoprolol tartrate (LOPRESSOR) 25 MG tablet; Take 1 tablet (25 mg total) by mouth 2 (two) times daily. -     chlorthalidone (HYGROTON) 25 MG tablet; Take 1 tablet (25 mg total) by mouth daily. -     COMPLETE METABOLIC PANEL WITH GFR -     Lipid Panel  Idiopathic gout, unspecified chronicity, unspecified site -     allopurinol (ZYLOPRIM) 300 MG tablet; Take 1 tablet (300 mg total) by mouth daily. -     Uric Acid -     CBC  Chronic prostatitis -     tamsulosin (FLOMAX) 0.4 MG CAPS capsule; Take 1 capsule (0.4 mg total) by mouth daily after supper. -     PSA

## 2015-03-31 NOTE — Assessment & Plan Note (Signed)
A: BP poorly controlled with edema. On norvasc Med: intermittently compliant P:  Change today is stopping norvasc and replacing with chlorthalidone. This is to control BP and improve swelling. We will need to monitor uric acid given your hx of gout.

## 2015-03-31 NOTE — Progress Notes (Signed)
Patient ID: Rickey Calderon, male   DOB: 17-Mar-1966, 49 y.o.   MRN: [id number]   Subjective:  Patient ID: Rickey Calderon, male    DOB: 1965/08/21  Age: 49 y.o. MRN: [id number]  CC: Follow-up   HPI Rickey Calderon presents for   1. CHRONIC HYPERTENSION  Disease Monitoring  Blood pressure range: not checking   Chest pain: no   Dyspnea: no   Claudication: yes, R calf pain with walking at times   Medication compliance: yes, but skipping some doses due to being short on meds. Reports metoprolol 25 mg BIF>   Medication Side Effects  Lightheadedness: no   Urinary frequency: no   Edema: yes, chronic swelling in legs     Preventitive Healthcare:  Exercise: yes, minimal walking    Diet Pattern:   Salt Restriction: yes    2. RLE arterial embolism: s/p embolectomy. Continues on xarelto. Needs refill. Has f/u with vascular in 3 weeks. No bleeding or bruising. Has R calf pain at times.  3. Chronic low back pain: R sided. Non-radiating. Worse with ambulation. In setting of morbid obesity. Request TSH check. Request diet pill if available. minimal exercise.   Social History  Substance Use Topics  . Smoking status: Never Smoker   . Smokeless tobacco: Not on file  . Alcohol Use: No    Outpatient Prescriptions Prior to Visit  Medication Sig Dispense Refill  . cetirizine (ZYRTEC) 10 MG tablet Take 1 tablet (10 mg total) by mouth daily. 30 tablet 11  . Fluticasone-Salmeterol (ADVAIR) 100-50 MCG/DOSE AEPB Inhale 1 puff into the lungs 2 (two) times daily.    . Multiple Vitamin (MULTIVITAMIN WITH MINERALS) TABS tablet Take 1 tablet by mouth daily.    . potassium chloride SA (K-DUR,KLOR-CON) 20 MEQ tablet Take 20 mEq by mouth daily.    . sertraline (ZOLOFT) 100 MG tablet Take 200 mg by mouth daily.    Marland Kitchen allopurinol (ZYLOPRIM) 300 MG tablet Take 1 tablet (300 mg total) by mouth daily. 30 tablet 3  . amLODipine (NORVASC) 10 MG tablet Take 1 tablet (10 mg total) by mouth daily. 30 tablet 3  .  metoprolol tartrate (LOPRESSOR) 25 MG tablet Take 2 tablets (50 mg total) by mouth 2 (two) times daily. (Patient taking differently: Take 25 mg by mouth 2 (two) times daily. ) 120 tablet 3  . tamsulosin (FLOMAX) 0.4 MG CAPS capsule Take 1 capsule (0.4 mg total) by mouth daily after supper. 30 capsule 3  . desloratadine (CLARINEX) 5 MG tablet Take 1 tablet (5 mg total) by mouth daily. (Patient not taking: Reported on 03/31/2015) 30 tablet 2  . gabapentin (NEURONTIN) 300 MG capsule Take 1 capsule (300 mg total) by mouth 3 (three) times daily. (Patient not taking: Reported on 03/31/2015) 90 capsule 3  . oxyCODONE-acetaminophen (PERCOCET) 5-325 MG per tablet Take 1-2 tablets by mouth every 4 (four) hours as needed. (Patient not taking: Reported on 03/31/2015) 20 tablet 0  . rivaroxaban (XARELTO) 20 MG TABS tablet Take 1 tablet (20 mg total) by mouth daily with supper. 30 tablet 3   No facility-administered medications prior to visit.    ROS Review of Systems  Constitutional: Negative for fever, chills, fatigue and unexpected weight change.  Eyes: Negative for visual disturbance.  Respiratory: Negative for cough and shortness of breath.   Cardiovascular: Positive for leg swelling. Negative for chest pain and palpitations.  Gastrointestinal: Negative for nausea, vomiting, abdominal pain, diarrhea, constipation and blood in stool.  Endocrine: Negative for polydipsia, polyphagia  and polyuria.  Musculoskeletal: Positive for myalgias and back pain. Negative for arthralgias, gait problem and neck pain.  Skin: Negative for rash.  Allergic/Immunologic: Negative for immunocompromised state.  Hematological: Negative for adenopathy. Does not bruise/bleed easily.  Psychiatric/Behavioral: Negative for suicidal ideas, sleep disturbance and dysphoric mood. The patient is not nervous/anxious.     Objective:  BP 152/96 mmHg  Pulse 76  Temp(Src) 97.9 F (36.6 C)  Resp 16  Ht  (1.702 m)  Wt 369 lb 6.4 oz  (167.559 kg)  BMI 57.84 kg/m2  SpO2 100%  BP/Weight 03/31/2015 12/11/2014 10/06/2014  Systolic BP 152 130 131  Diastolic BP 96 80 74  Wt. (Lbs) 369.4 361.8 362  BMI 57.84 56.65 56.68   Physical Exam  Constitutional: He appears well-developed and well-nourished. No distress.  Morbidly obese   HENT:  Head: Normocephalic and atraumatic.  Neck: Normal range of motion. Neck supple. No thyromegaly present.  Cardiovascular: Normal rate, regular rhythm, normal heart sounds and intact distal pulses.   Pulmonary/Chest: Effort normal and breath sounds normal.  Musculoskeletal: He exhibits edema (1+ b/l LE edema ).  Lymphadenopathy:    He has no cervical adenopathy.  Neurological: He is alert.  Skin: Skin is warm and dry. No rash noted. No erythema.  Psychiatric: He has a normal mood and affect.    Assessment & Plan:   Problem List Items Addressed This Visit    Chronic low back pain (Chronic)   Relevant Medications   acetaminophen-codeine (TYLENOL #3) 300-30 MG tablet   gabapentin (NEURONTIN) 300 MG capsule   Chronic prostatitis   Relevant Medications   tamsulosin (FLOMAX) 0.4 MG CAPS capsule   Other Relevant Orders   PSA   Gout (Chronic)   Relevant Medications   allopurinol (ZYLOPRIM) 300 MG tablet   Other Relevant Orders   Uric Acid   CBC   Hypertension (Chronic)   Relevant Medications   rivaroxaban (XARELTO) 20 MG TABS tablet   metoprolol tartrate (LOPRESSOR) 25 MG tablet   chlorthalidone (HYGROTON) 25 MG tablet   Other Relevant Orders   COMPLETE METABOLIC PANEL WITH GFR   Lipid Panel   Severe obesity (BMI >= 40) (HCC) (Chronic)   Relevant Orders   Amb ref to Medical Nutrition Therapy-MNT   TSH    Other Visit Diagnoses    Healthcare maintenance    -  Primary    Relevant Orders    Flu Vaccine QUAD 36+ mos PF IM (Fluarix & Fluzone Quad PF) (Completed)    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    Follow-up: Return in about 4 weeks (around 04/28/2015).   Dessa Phi MD

## 2015-04-01 LAB — PSA: PSA: 0.31 ng/mL (ref <=4.00)

## 2015-04-09 ENCOUNTER — Emergency Department (HOSPITAL_COMMUNITY)
Admission: EM | Admit: 2015-04-09 | Discharge: 2015-04-09 | Disposition: A | Discharge status: Home / Self Care | Payer: Medicaid Other | Attending: Emergency Medicine | Admitting: Emergency Medicine

## 2015-04-09 ENCOUNTER — Encounter (HOSPITAL_COMMUNITY): Payer: Self-pay | Admitting: Emergency Medicine

## 2015-04-09 DIAGNOSIS — F329 Major depressive disorder, single episode, unspecified: Secondary | ICD-10-CM | POA: Diagnosis not present

## 2015-04-09 DIAGNOSIS — I10 Essential (primary) hypertension: Secondary | ICD-10-CM | POA: Insufficient documentation

## 2015-04-09 DIAGNOSIS — M109 Gout, unspecified: Secondary | ICD-10-CM | POA: Diagnosis not present

## 2015-04-09 DIAGNOSIS — M545 Low back pain, unspecified: Secondary | ICD-10-CM

## 2015-04-09 DIAGNOSIS — Z79899 Other long term (current) drug therapy: Secondary | ICD-10-CM | POA: Diagnosis not present

## 2015-04-09 DIAGNOSIS — G8929 Other chronic pain: Secondary | ICD-10-CM | POA: Insufficient documentation

## 2015-04-09 DIAGNOSIS — M79604 Pain in right leg: Secondary | ICD-10-CM | POA: Diagnosis not present

## 2015-04-09 NOTE — ED Notes (Signed)
Pt arrives via [facility] from home with low back pain. Pt states he called because he couldn't get a ride and the bus stop was too far from his home. Denies recent injury. Hx of chronic back pain worse for the last 3 days. Denies bowel/bladder incontinence. Pt also reports right leg is hurting. 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 Vss.

## 2015-04-09 NOTE — ED Notes (Signed)
Attempting to change pts appointment so medicaid transportation can take pt to appointment tomorrow from 10-2.

## 2015-04-09 NOTE — ED Notes (Signed)
[facility] called to make appointment for pt. Appointment scheduled for tomorrow at 3pm.

## 2015-04-09 NOTE — ED Notes (Signed)
Wellness center states they will arrange transportation for pt tomorrow at 3pm. Pt made aware that he needs to be home from 1pm-3pm for his ride. Pt requesting taxi voucher to get home.

## 2015-04-09 NOTE — Discharge Instructions (Signed)
Follow up with your doctor tomorrow as dicussed Chronic Pain Chronic pain can be defined as pain that is off and on and lasts for 3-6 months or longer. Many things cause chronic pain, which can make it difficult to make a diagnosis. There are many treatment options available for chronic pain. However, finding a treatment that works well for you may require trying various approaches until the right one is found. Many people benefit from a combination of two or more types of treatment to control their pain. SYMPTOMS  Chronic pain can occur anywhere in the body and can range from mild to very severe. Some types of chronic pain include:  Headache.  Low back pain.  Cancer pain.  Arthritis pain.  Neurogenic pain. This is pain resulting from damage to nerves. People with chronic pain may also have other symptoms such as:  Depression.  Anger.  Insomnia.  Anxiety. DIAGNOSIS  Your health care provider will help diagnose your condition over time. In many cases, the initial focus will be on excluding possible conditions that could be causing the pain. Depending on your symptoms, your health care provider may order tests to diagnose your condition. Some of these tests may include:   Blood tests.   CT scan.   MRI.   X-rays.   Ultrasounds.   Nerve conduction studies.  You may need to see a specialist.  TREATMENT  Finding treatment that works well may take time. You may be referred to a pain specialist. He or she may prescribe medicine or therapies, such as:   Mindful meditation or yoga.  Shots (injections) of numbing or pain-relieving medicines into the spine or area of pain.  Local electrical stimulation.  Acupuncture.   Massage therapy.   Aroma, color, light, or sound therapy.   Biofeedback.   Working with a physical therapist to keep from getting stiff.   Regular, gentle exercise.   Cognitive or behavioral therapy.   Group support.  Sometimes, surgery  may be recommended.  HOME CARE INSTRUCTIONS   Take all medicines as directed by your health care provider.   Lessen stress in your life by relaxing and doing things such as listening to calming music.   Exercise or be active as directed by your health care provider.   Eat a healthy diet and include things such as vegetables, fruits, fish, and lean meats in your diet.   Keep all follow-up appointments with your health care provider.   Attend a support group with others suffering from chronic pain. SEEK MEDICAL CARE IF:   Your pain gets worse.   You develop a new pain that was not there before.   You cannot tolerate medicines given to you by your health care provider.   You have new symptoms since your last visit with your health care provider.  SEEK IMMEDIATE MEDICAL CARE IF:   You feel weak.   You have decreased sensation or numbness.   You lose control of bowel or bladder function.   Your pain suddenly gets much worse.   You develop shaking.  You develop chills.  You develop confusion.  You develop chest pain.  You develop shortness of breath.  MAKE SURE YOU:  Understand these instructions.  Will watch your condition.  Will get help right away if you are not doing well or get worse.   This information is not intended to replace advice given to you by your health care provider. Make sure you discuss any questions you have with your  health care provider.   Document Released: 03/05/2002 Document Revised: 02/13/2013 Document Reviewed: 12/07/2012 Elsevier Interactive Patient Education [employer]2016 [employer]

## 2015-04-09 NOTE — ED Provider Notes (Signed)
CSN: [id number]     Arrival date & time 04/09/15  1104 History   First MD Initiated Contact with Patient 04/09/15 1112     Chief Complaint  Patient presents with  . Back Pain  . Leg Pain     (Consider location/radiation/quality/duration/timing/severity/associated sxs/prior Treatment) HPI Comments: Pt comes in with c/o of chronic lower back pain and right leg pain. Pt states that he was seen by his pcp last week and given Tylenol 3 without much relief. Denies any new injury. He states that he called ems because that is the only place he could call since his phone is out of minutes. Denies incontinence, numbness or weakness. He states that he has had pain in his leg since he had a dvt a couple of months ago. No new swelling or change in symptoms  The history is provided by the patient. No language interpreter was used.    Past Medical History  Diagnosis Date  . Hypertension   . Gout   . Prostatitis   . Bronchitis   . Depression   . Seasonal allergies    Past Surgical History  Procedure Laterality Date  . Abdominal aortagram N/A 07/28/2014    Procedure: ABDOMINAL Ronny Flurry;  Surgeon: Chuck Hint, MD;  Location: [facility] CATH LAB;  Service: Cardiovascular;  Laterality: N/A;  . Lower extremity angiogram Bilateral 07/28/2014    Procedure: LOWER EXTREMITY ANGIOGRAM;  Surgeon: Chuck Hint, MD;  Location: [facility] CATH LAB;  Service: Cardiovascular;  Laterality: Bilateral;  . Thrombectomy femoral artery Right 08/01/2014    Procedure: THROMBECTOMY RIGHT LEG SFA, Anterior tibial, and perineal arteries with intraoperative arteriograms;  Surgeon: Nada Libman, MD;  Location: [facility] OR;  Service: Vascular;  Laterality: Right;   Family History  Problem Relation Age of Onset  . Diabetes Father   . Hyperlipidemia Father    Social History  Substance Use Topics  . Smoking status: Never Smoker   . Smokeless tobacco: None  . Alcohol Use: No    Review of Systems  All other systems reviewed  and are negative.     Allergies  Review of patient's allergies indicates no known allergies.  Home Medications   Prior to Admission medications   Medication Sig Start Date End Date Taking? Authorizing Provider  acetaminophen-codeine (TYLENOL #3) 300-30 MG tablet Take 1 tablet by mouth every 8 (eight) hours as needed. 03/31/15   Josalyn Funches, MD  allopurinol (ZYLOPRIM) 300 MG tablet Take 1 tablet (300 mg total) by mouth daily. 03/31/15   Josalyn Funches, MD  cetirizine (ZYRTEC) 10 MG tablet Take 1 tablet (10 mg total) by mouth daily. 11/18/14   Doris Cheadle, MD  chlorthalidone (HYGROTON) 25 MG tablet Take 1 tablet (25 mg total) by mouth daily. 03/31/15   Josalyn Funches, MD  Fluticasone-Salmeterol (ADVAIR) 100-50 MCG/DOSE AEPB Inhale 1 puff into the lungs 2 (two) times daily.    Historical Provider, MD  gabapentin (NEURONTIN) 300 MG capsule Take 1 capsule (300 mg total) by mouth 3 (three) times daily. 03/31/15   Josalyn Funches, MD  metoprolol tartrate (LOPRESSOR) 25 MG tablet Take 1 tablet (25 mg total) by mouth 2 (two) times daily. 03/31/15   Dessa Phi, MD  Multiple Vitamin (MULTIVITAMIN WITH MINERALS) TABS tablet Take 1 tablet by mouth daily.    Historical Provider, MD  potassium chloride SA (K-DUR,KLOR-CON) 20 MEQ tablet Take 20 mEq by mouth daily.    Historical Provider, MD  rivaroxaban (XARELTO) 20 MG TABS tablet Take 1 tablet (20  mg total) by mouth daily with supper. 03/31/15   Josalyn Funches, MD  sertraline (ZOLOFT) 100 MG tablet Take 200 mg by mouth daily.    Historical Provider, MD  tamsulosin (FLOMAX) 0.4 MG CAPS capsule Take 1 capsule (0.4 mg total) by mouth daily after supper. 03/31/15   Dessa PhiJosalyn Funches, MD   There were no vitals taken for this visit. Physical Exam  Constitutional: He is oriented to person, place, and time. He appears well-developed and well-nourished.  Cardiovascular: Normal rate and regular rhythm.   Pulmonary/Chest: Effort normal and breath sounds  normal.  Musculoskeletal: Normal range of motion.  Lower back ttp. Moving all extremities without any problem. Equal strength and sensation bilaterally  Neurological: He is alert and oriented to person, place, and time.  Skin: Skin is warm and dry.  Psychiatric: He has a normal mood and affect.  Nursing note and vitals reviewed.   ED Course  Procedures (including critical care time) Labs Review Labs Reviewed - No data to display  Imaging Review No results found. I have personally reviewed and evaluated these images and lab results as part of my medical decision-making.   EKG Interpretation None      MDM   Final diagnoses:  Chronic lower back pain    Related to chronic symptoms. Pt has appointment set up tomorrow. Social work involved. Discussed not changing medications as this is chronic    Teressa LowerVrinda Rui Wordell, NP 04/09/15 1237  Elwin MochaBlair Walden, MD 04/09/15 1549

## 2015-04-10 ENCOUNTER — Ambulatory Visit: Payer: Medicaid Other | Admitting: Family Medicine

## 2015-04-15 ENCOUNTER — Encounter: Payer: Self-pay | Admitting: Family

## 2015-04-20 ENCOUNTER — Encounter (HOSPITAL_COMMUNITY): Payer: Medicaid Other

## 2015-04-20 ENCOUNTER — Encounter: Payer: Self-pay | Admitting: Family

## 2015-04-20 ENCOUNTER — Ambulatory Visit: Payer: Medicaid Other | Admitting: Family

## 2015-04-20 ENCOUNTER — Ambulatory Visit (INDEPENDENT_AMBULATORY_CARE_PROVIDER_SITE_OTHER): Payer: Medicaid Other | Admitting: Family

## 2015-04-20 VITALS — BP 153/102 | HR 66 | Temp 97.1°F | Resp 16 | Ht 66.6 in | Wt 380.0 lb

## 2015-04-20 DIAGNOSIS — Z48812 Encounter for surgical aftercare following surgery on the circulatory system: Secondary | ICD-10-CM | POA: Diagnosis not present

## 2015-04-20 DIAGNOSIS — I743 Embolism and thrombosis of arteries of the lower extremities: Secondary | ICD-10-CM

## 2015-04-20 NOTE — Progress Notes (Signed)
Filed Vitals:   04/20/15 1039 04/20/15 1049  BP: 164/114 153/102  Pulse: 68 66  Temp: 97.1 F (36.2 C)   TempSrc: Oral   Resp: 16   Height: 5' 6.6" (1.692 m)   Weight: 380 lb (172.367 kg)   SpO2: 96%

## 2015-04-20 NOTE — Progress Notes (Signed)
[facility] [place] HISTORY AND PHYSICAL -PAD  History of Present Illness Rickey Calderon is a 49 y.o. male patient of Dr. Myra Gianotti is back today for follow-up. On 08/01/2014 he underwent thrombectomy of his right superficial femoral, popliteal, and anterior tibial and peroneal artery through a right above-knee popliteal artery exposure. The patient had relatively fresh thrombus. His symptoms improved. He did have difficulty recovering given his morbid obesity. His insurance denied coverage of non invasive vascular labs apparently until evaluation by me.  At a previous visit, Dr. Myra Gianotti debrided some of his wound. He has been doing daily dressing changes. Unfortunately, he did have a fall where he did a split. This was very uncomfortable. He also commented on a wound on the left lateral side of his left leg.  He has a 2 mile walk to get to his bus, he started this about 1.5 months ago. After walking about 15 minutes he develops right lower medial thigh, knee, and anterior tibial cramping, relieved by rest. He denies non healing wounds. He states that his right leg surgical incision has healed completely. It is difficult to ascertain if his current right leg pain is any different than his preoperative pain.    Pt denies any history of stroke or TIA.  The patient denies New Medical or Surgical History.  Pt Diabetic: No Pt smoker: non-smoker  Pt meds include: Statin :No Betablocker: Yes ASA: No Other anticoagulants/antiplatelets: Xarelto  Past Medical History  Diagnosis Date  . Hypertension   . Gout   . Prostatitis   . Bronchitis   . Depression   . Seasonal allergies     Social History Social History  Substance Use Topics  . Smoking status: Never Smoker   . Smokeless tobacco: Never Used  . Alcohol Use: No    Family History Family History  Problem Relation Age of Onset  . Diabetes Father   . Hyperlipidemia Father     Past Surgical History   Procedure Laterality Date  . Abdominal aortagram N/A 07/28/2014    Procedure: ABDOMINAL Ronny Flurry;  Surgeon: Chuck Hint, MD;  Location: [facility] CATH LAB;  Service: Cardiovascular;  Laterality: N/A;  . Lower extremity angiogram Bilateral 07/28/2014    Procedure: LOWER EXTREMITY ANGIOGRAM;  Surgeon: Chuck Hint, MD;  Location: [facility] CATH LAB;  Service: Cardiovascular;  Laterality: Bilateral;  . Thrombectomy femoral artery Right 08/01/2014    Procedure: THROMBECTOMY RIGHT LEG SFA, Anterior tibial, and perineal arteries with intraoperative arteriograms;  Surgeon: Nada Libman, MD;  Location: [facility] OR;  Service: Vascular;  Laterality: Right;    No Known Allergies  Current Outpatient Prescriptions  Medication Sig Dispense Refill  . acetaminophen-codeine (TYLENOL #3) 300-30 MG tablet Take 1 tablet by mouth every 8 (eight) hours as needed. 90 tablet 0  . allopurinol (ZYLOPRIM) 300 MG tablet Take 1 tablet (300 mg total) by mouth daily. 30 tablet 3  . cetirizine (ZYRTEC) 10 MG tablet Take 1 tablet (10 mg total) by mouth daily. 30 tablet 11  . chlorthalidone (HYGROTON) 25 MG tablet Take 1 tablet (25 mg total) by mouth daily. 30 tablet 1  . gabapentin (NEURONTIN) 300 MG capsule Take 1 capsule (300 mg total) by mouth 3 (three) times daily. 90 capsule 3  . metoprolol tartrate (LOPRESSOR) 25 MG tablet Take 1 tablet (25 mg total) by mouth 2 (two) times daily. 60 tablet 5  . rivaroxaban (XARELTO) 20 MG TABS tablet Take 1 tablet (20 mg total) by mouth daily with supper.  30 tablet 3  . tamsulosin (FLOMAX) 0.4 MG CAPS capsule Take 1 capsule (0.4 mg total) by mouth daily after supper. 30 capsule 3  . Fluticasone-Salmeterol (ADVAIR) 100-50 MCG/DOSE AEPB Inhale 1 puff into the lungs 2 (two) times daily.    . Multiple Vitamin (MULTIVITAMIN WITH MINERALS) TABS tablet Take 1 tablet by mouth daily.    . potassium chloride SA (K-DUR,KLOR-CON) 20 MEQ tablet Take 20 mEq by mouth daily.    . sertraline (ZOLOFT)  100 MG tablet Take 200 mg by mouth daily.     No current facility-administered medications for this visit.    ROS: See HPI for pertinent positives and negatives.   Physical Examination  Filed Vitals:   04/20/15 1039 04/20/15 1049  BP: 164/114 153/102  Pulse: 68 66  Temp: 97.1 F (36.2 C)   TempSrc: Oral   Resp: 16   Height: 5' 6.6" (1.692 m)   Weight: 380 lb (172.367 kg)   SpO2: 96%    Body mass index is 60.21 kg/(m^2).  General: A&O x 3, WDWN, morbidly obese male. Gait: normal Eyes: PERRLA. Pulmonary: CTAB, without wheezes , rales or rhonchi. Cardiac: regular rhythm, no detected murmur.         Carotid Bruits Right Left   Negative Negative  Aorta is not palpable. Radial pulses: 2+ palpable and =                           VASCULAR EXAM: Extremities without ischemic changes, without Gangrene; without open wounds. Right medial thigh incision is well healed.                                                                                                          LE Pulses Right Left       FEMORAL  not palpable, large panus  not palpable, large panus        POPLITEAL  not palpable   not palpable       POSTERIOR TIBIAL  faintly palpable   1+ palpable        DORSALIS PEDIS      ANTERIOR TIBIAL 2+ palpable  2+ palpable    Abdomen: softly obese, very large panus, NT, no palpable masses. Skin: no rashes, no ulcers. Musculoskeletal: no muscle wasting or atrophy. 1+ bilateral pitting edema in lower legs.  Neurologic: A&O X 3; Appropriate Affect ; SENSATION: normal; MOTOR FUNCTION:  moving all extremities equally, motor strength 5/5 throughout. Speech is fluent/normal. CN 2-12 intact. Positive right straight leg raise test: mild pain in his right medial thigh with this maneuver.    ASSESSMENT: Rickey Calderon is a 49 y.o. male who is s/p thrombectomy of his right superficial femoral, popliteal, and anterior tibial and peroneal artery through a right above-knee popliteal  artery exposure on 08/01/2014. The patient had relatively fresh thrombus. His symptoms improved. He did have difficulty recovering given his morbid obesity. His insurance denied coverage of non invasive vascular labs apparently until evaluation by me.   The patient had many  questions related to his right leg pain with walking. He has 2+ palpable DP pulses in both feet. It is unlikely that the right leg pain is related the lack of arterial perfusion. He did have a mildly positive straight leg raise test in his right leg. I discussed pt HPI and physical exam results with Dr. Myra Gianotti. Since he has 2+ palpable bilateral DP pulses and his surgical incision has healed completely, he may follow up as needed. Apparently Medicaid has denied his non invasive vascular testing twice. Dr. Myra Gianotti advises that pt remain on an anticoagulant for the rest of his life due to the extent of the thrombus in his leg.   PLAN:  Continued the extensive walking program.  Based on the patient's vascular studies and examination, pt will follow up as needed.  I discussed in depth with the patient the nature of atherosclerosis, and emphasized the importance of maximal medical management including strict control of blood pressure, blood glucose, and lipid levels, obtaining regular exercise, and continued cessation of smoking.  The patient is aware that without maximal medical management the underlying atherosclerotic disease process will progress, limiting the benefit of any interventions.  The patient was given information about PAD including signs, symptoms, treatment, what symptoms should prompt the patient to seek immediate medical care, and risk reduction measures to take.  Charisse March, RN, MSN, FNP-C [facility] [place] Phone: [phone]  Clinic MD: Myra Gianotti  04/20/2015 10:57 AM

## 2015-04-20 NOTE — Patient Instructions (Signed)
Peripheral Vascular Disease Peripheral vascular disease (PVD) is a disease of the blood vessels that are not part of your heart and brain. A simple term for PVD is poor circulation. In most cases, PVD narrows the blood vessels that carry blood from your heart to the rest of your body. This can result in a decreased supply of blood to your arms, legs, and internal organs, like your stomach or kidneys. However, it most often affects a person's lower legs and feet. There are two types of PVD.  Organic PVD. This is the more common type. It is caused by damage to the structure of blood vessels.  Functional PVD. This is caused by conditions that make blood vessels contract and tighten (spasm). Without treatment, PVD tends to get worse over time. PVD can also lead to acute ischemic limb. This is when an arm or limb suddenly has trouble getting enough blood. This is a medical emergency. CAUSES Each type of PVD has many different causes. The most common cause of PVD is buildup of a fatty material (plaque) inside of your arteries (atherosclerosis). Small amounts of plaque can break off from the walls of the blood vessels and become lodged in a smaller artery. This blocks blood flow and can cause acute ischemic limb. Other common causes of PVD include:  Blood clots that form inside of blood vessels.  Injuries to blood vessels.  Diseases that cause inflammation of blood vessels or cause blood vessel spasms.  Health behaviors and health history that increase your risk of developing PVD. RISK FACTORS  You may have a greater risk of PVD if you:  Have a family history of PVD.  Have certain medical conditions, including:  High cholesterol.  Diabetes.  High blood pressure (hypertension).  Coronary heart disease.  Past problems with blood clots.  Past injury, such as burns or a broken bone. These may have damaged blood vessels in your limbs.  Buerger disease. This is caused by inflamed blood  vessels in your hands and feet.  Some forms of arthritis.  Rare birth defects that affect the arteries in your legs.  Use tobacco.  Do not get enough exercise.  Are obese.  Are age 50 or older. SIGNS AND SYMPTOMS  PVD may cause many different symptoms. Your symptoms depend on what part of your body is not getting enough blood. Some common signs and symptoms include:  Cramps in your lower legs. This may be a symptom of poor leg circulation (claudication).  Pain and weakness in your legs while you are physically active that goes away when you rest (intermittent claudication).  Leg pain when at rest.  Leg numbness, tingling, or weakness.  Coldness in a leg or foot, especially when compared with the other leg.  Skin or hair changes. These can include:  Hair loss.  Shiny skin.  Pale or bluish skin.  Thick toenails.  Inability to get or maintain an erection (erectile dysfunction). People with PVD are more prone to developing ulcers and sores on their toes, feet, or legs. These may take longer than normal to heal. DIAGNOSIS Your health care provider may diagnose PVD from your signs and symptoms. The health care provider will also do a physical exam. You may have tests to find out what is causing your PVD and determine its severity. Tests may include:  Blood pressure recordings from your arms and legs and measurements of the strength of your pulses (pulse volume recordings).  Imaging studies using sound waves to take pictures of   the blood flow through your blood vessels (Doppler ultrasound).  Injecting a dye into your blood vessels before having imaging studies using:  X-rays (angiogram or arteriogram).  Computer-generated X-rays (CT angiogram).  A powerful electromagnetic field and a computer (magnetic resonance angiogram or MRA). TREATMENT Treatment for PVD depends on the cause of your condition and the severity of your symptoms. It also depends on your age. Underlying  causes need to be treated and controlled. These include long-lasting (chronic) conditions, such as diabetes, high cholesterol, and high blood pressure. You may need to first try making lifestyle changes and taking medicines. Surgery may be needed if these do not work. Lifestyle changes may include:  Quitting smoking.  Exercising regularly.  Following a low-fat, low-cholesterol diet. Medicines may include:  Blood thinners to prevent blood clots.  Medicines to improve blood flow.  Medicines to improve your blood cholesterol levels. Surgical procedures may include:  A procedure that uses an inflated balloon to open a blocked artery and improve blood flow (angioplasty).  A procedure to put in a tube (stent) to keep a blocked artery open (stent implant).  Surgery to reroute blood flow around a blocked artery (peripheral bypass surgery).  Surgery to remove dead tissue from an infected wound on the affected limb.  Amputation. This is surgical removal of the affected limb. This may be necessary in cases of acute ischemic limb that are not improved through medical or surgical treatments. HOME CARE INSTRUCTIONS  Take medicines only as directed by your health care provider.  Do not use any tobacco products, including cigarettes, chewing tobacco, or electronic cigarettes. If you need help quitting, ask your health care provider.  Lose weight if you are overweight, and maintain a healthy weight as directed by your health care provider.  Eat a diet that is low in fat and cholesterol. If you need help, ask your health care provider.  Exercise regularly. Ask your health care provider to suggest some good activities for you.  Use compression stockings or other mechanical devices as directed by your health care provider.  Take good care of your feet.  Wear comfortable shoes that fit well.  Check your feet often for any cuts or sores. SEEK MEDICAL CARE IF:  You have cramps in your legs  while walking.  You have leg pain when you are at rest.  You have coldness in a leg or foot.  Your skin changes.  You have erectile dysfunction.  You have cuts or sores on your feet that are not healing. SEEK IMMEDIATE MEDICAL CARE IF:  Your arm or leg turns cold and blue.  Your arms or legs become red, warm, swollen, painful, or numb.  You have chest pain or trouble breathing.  You suddenly have weakness in your face, arm, or leg.  You become very confused or lose the ability to speak.  You suddenly have a very bad headache or lose your vision.   This information is not intended to replace advice given to you by your health care provider. Make sure you discuss any questions you have with your health care provider.   Document Released: 07/21/2004 Document Revised: 07/04/2014 Document Reviewed: 11/21/2013 [employer] Interactive Patient Education 2016 [employer]  

## 2015-04-30 ENCOUNTER — Ambulatory Visit: Payer: Medicaid Other

## 2015-04-30 ENCOUNTER — Ambulatory Visit: Payer: Medicaid Other | Admitting: Pharmacist

## 2015-04-30 ENCOUNTER — Encounter: Payer: Self-pay | Admitting: Family Medicine

## 2015-04-30 ENCOUNTER — Ambulatory Visit: Payer: Medicaid Other | Attending: Family Medicine | Admitting: Family Medicine

## 2015-04-30 VITALS — BP 168/106 | HR 76 | Temp 97.8°F | Resp 16 | Ht 67.0 in | Wt 379.0 lb

## 2015-04-30 DIAGNOSIS — Z6841 Body Mass Index (BMI) 40.0 and over, adult: Secondary | ICD-10-CM | POA: Diagnosis not present

## 2015-04-30 DIAGNOSIS — Z7902 Long term (current) use of antithrombotics/antiplatelets: Secondary | ICD-10-CM | POA: Insufficient documentation

## 2015-04-30 DIAGNOSIS — Z598 Other problems related to housing and economic circumstances: Secondary | ICD-10-CM | POA: Insufficient documentation

## 2015-04-30 DIAGNOSIS — Z79899 Other long term (current) drug therapy: Secondary | ICD-10-CM | POA: Insufficient documentation

## 2015-04-30 DIAGNOSIS — G8929 Other chronic pain: Secondary | ICD-10-CM | POA: Diagnosis not present

## 2015-04-30 DIAGNOSIS — I10 Essential (primary) hypertension: Secondary | ICD-10-CM | POA: Diagnosis present

## 2015-04-30 DIAGNOSIS — M109 Gout, unspecified: Secondary | ICD-10-CM | POA: Insufficient documentation

## 2015-04-30 DIAGNOSIS — M1000 Idiopathic gout, unspecified site: Secondary | ICD-10-CM

## 2015-04-30 DIAGNOSIS — M545 Low back pain, unspecified: Secondary | ICD-10-CM

## 2015-04-30 DIAGNOSIS — Z599 Problem related to housing and economic circumstances, unspecified: Secondary | ICD-10-CM

## 2015-04-30 LAB — URIC ACID: URIC ACID, SERUM: 8.3 mg/dL — AB (ref 4.0–7.8)

## 2015-04-30 LAB — BASIC METABOLIC PANEL
BUN: 28 mg/dL — ABNORMAL HIGH (ref 7–25)
CALCIUM: 9.2 mg/dL (ref 8.6–10.3)
CO2: 32 mmol/L — AB (ref 20–31)
Chloride: 98 mmol/L (ref 98–110)
Creat: 1.51 mg/dL — ABNORMAL HIGH (ref 0.60–1.35)
GLUCOSE: 109 mg/dL — AB (ref 65–99)
POTASSIUM: 4.2 mmol/L (ref 3.5–5.3)
SODIUM: 141 mmol/L (ref 135–146)

## 2015-04-30 MED ORDER — CHLORTHALIDONE 50 MG PO TABS: 50.0000 mg | ORAL_TABLET | Freq: Every day | ORAL | Status: DC

## 2015-04-30 MED ORDER — FLUTICASONE-SALMETEROL 100-50 MCG/DOSE IN AEPB: 1.0000 | INHALATION_SPRAY | Freq: Two times a day (BID) | RESPIRATORY_TRACT | Status: DC

## 2015-04-30 MED ORDER — THERA VITAL M PO TABS: 1.0000 | ORAL_TABLET | Freq: Every day | ORAL | Status: DC

## 2015-04-30 MED ORDER — LOSARTAN POTASSIUM 25 MG PO TABS: 25.0000 mg | ORAL_TABLET | Freq: Every day | ORAL | Status: DC

## 2015-04-30 MED ORDER — CYCLOBENZAPRINE HCL 10 MG PO TABS: 10.0000 mg | ORAL_TABLET | Freq: Three times a day (TID) | ORAL | Status: DC | PRN

## 2015-04-30 MED ORDER — ACETAMINOPHEN-CODEINE #3 300-30 MG PO TABS: 1.0000 | ORAL_TABLET | Freq: Three times a day (TID) | ORAL | Status: DC | PRN

## 2015-04-30 NOTE — Progress Notes (Signed)
Patient ID: Rickey MazeLennard Bettcher, male   DOB: 08-09-1965, 49 y.o.   MRN: [id number]   Subjective:  Patient ID: Rickey Calderon, male    DOB: 08-09-1965  Age: 49 y.o. MRN: [id number]  CC: Hypertension and Joint Swelling   HPI Rickey MazeLennard Yablonski presents for HTN f/u and chronic low back pain    1. CHRONIC HYPERTENSION  Disease Monitoring  Blood pressure range: not checking   Chest pain: no   Dyspnea: no   Claudication: yes   Medication compliance: yes  Medication Side Effects  Lightheadedness: no   Urinary frequency: no   Edema: yes, has not improved with chlorthalidone.     2. Chronic low back pain: patient went to ED following last OV. He was not seen. He went for stiffness in his low back. His pain is primarily right sided. He is morbidly obese. Pain does not radiate. Pain is exacerbated by prolonged walking. Tylenol #3 does not help much with pain symptoms.   3. Financial stressors: patient unemployed. Stressed about work. Would like to work but back hurts following prolonged walking.   Social History  Substance Use Topics  . Smoking status: Never Smoker   . Smokeless tobacco: Never Used  . Alcohol Use: No    Outpatient Prescriptions Prior to Visit  Medication Sig Dispense Refill  . acetaminophen-codeine (TYLENOL #3) 300-30 MG tablet Take 1 tablet by mouth every 8 (eight) hours as needed. 90 tablet 0  . allopurinol (ZYLOPRIM) 300 MG tablet Take 1 tablet (300 mg total) by mouth daily. 30 tablet 3  . cetirizine (ZYRTEC) 10 MG tablet Take 1 tablet (10 mg total) by mouth daily. 30 tablet 11  . chlorthalidone (HYGROTON) 25 MG tablet Take 1 tablet (25 mg total) by mouth daily. 30 tablet 1  . gabapentin (NEURONTIN) 300 MG capsule Take 1 capsule (300 mg total) by mouth 3 (three) times daily. 90 capsule 3  . metoprolol tartrate (LOPRESSOR) 25 MG tablet Take 1 tablet (25 mg total) by mouth 2 (two) times daily. 60 tablet 5  . rivaroxaban (XARELTO) 20 MG TABS tablet Take 1 tablet (20 mg total) by  mouth daily with supper. 30 tablet 3  . tamsulosin (FLOMAX) 0.4 MG CAPS capsule Take 1 capsule (0.4 mg total) by mouth daily after supper. 30 capsule 3  . Fluticasone-Salmeterol (ADVAIR) 100-50 MCG/DOSE AEPB Inhale 1 puff into the lungs 2 (two) times daily.    . Multiple Vitamin (MULTIVITAMIN WITH MINERALS) TABS tablet Take 1 tablet by mouth daily.    . potassium chloride SA (K-DUR,KLOR-CON) 20 MEQ tablet Take 20 mEq by mouth daily.    . sertraline (ZOLOFT) 100 MG tablet Take 200 mg by mouth daily.     No facility-administered medications prior to visit.    ROS Review of Systems  Constitutional: Negative for fever, chills, fatigue and unexpected weight change.  Eyes: Negative for visual disturbance.  Respiratory: Negative for cough and shortness of breath.   Cardiovascular: Positive for leg swelling. Negative for chest pain and palpitations.  Gastrointestinal: Negative for nausea, vomiting, abdominal pain, diarrhea, constipation and blood in stool.  Endocrine: Negative for polydipsia, polyphagia and polyuria.  Musculoskeletal: Positive for back pain. Negative for myalgias, arthralgias, gait problem and neck pain.  Skin: Negative for rash.  Allergic/Immunologic: Negative for immunocompromised state.  Neurological: Positive for headaches.  Hematological: Negative for adenopathy. Does not bruise/bleed easily.  Psychiatric/Behavioral: Negative for suicidal ideas, sleep disturbance and dysphoric mood. The patient is not nervous/anxious.     Objective:  BP 168/106 mmHg  Pulse 76  Temp(Src) 97.8 F (36.6 C) (Oral)  Resp 16  Ht  (1.702 m)  Wt 379 lb (171.913 kg)  BMI 59.35 kg/m2  SpO2 97%  BP/Weight 04/30/2015 04/20/2015 04/09/2015  Systolic BP 168 153 131  Diastolic BP 106 102 88  Wt. (Lbs) 379 380 369  BMI 59.35 60.21 57.78   Physical Exam  Constitutional: He appears well-developed and well-nourished. No distress.  Morbidly obese   HENT:  Head: Normocephalic and  atraumatic.  Neck: Normal range of motion. Neck supple. No thyromegaly present.  Cardiovascular: Normal rate, regular rhythm, normal heart sounds and intact distal pulses.   Pulmonary/Chest: Effort normal and breath sounds normal.  Musculoskeletal: He exhibits edema (trace b/l LE ).  Lymphadenopathy:    He has no cervical adenopathy.  Neurological: He is alert.  Skin: Skin is warm and dry. No rash noted. No erythema.  Psychiatric: He has a normal mood and affect.   Depression screen [facility] 2/9 04/30/2015 03/31/2015  Decreased Interest 0 0  Down, Depressed, Hopeless 1 0  PHQ - 2 Score 1 0   GAD 7 : Generalized Anxiety Score 04/30/2015  Nervous, Anxious, on Edge 0  Control/stop worrying 1  Worry too much - different things 1  Trouble relaxing 0  Restless 0  Easily annoyed or irritable 1  Afraid - awful might happen 0  Total GAD 7 Score 3      Assessment & Plan:   Problem List Items Addressed This Visit    Chronic low back pain (Chronic)   Relevant Medications   acetaminophen-codeine (TYLENOL #3) 300-30 MG tablet   cyclobenzaprine (FLEXERIL) 10 MG tablet   Other Relevant Orders   DG Lumbar Spine 2-3 Views   Gout (Chronic)   Relevant Orders   Uric Acid   Hypertension - Primary (Chronic)   Relevant Medications   losartan (COZAAR) 25 MG tablet   Other Relevant Orders   Uric Acid   Basic Metabolic Panel      Meds ordered this encounter  Medications  . Fluticasone-Salmeterol (ADVAIR) 100-50 MCG/DOSE AEPB    Sig: Inhale 1 puff into the lungs 2 (two) times daily.    Dispense:  60 each    Refill:  2  . DISCONTD: chlorthalidone (HYGROTON) 50 MG tablet    Sig: Take 1 tablet (50 mg total) by mouth daily.    Dispense:  50 tablet    Refill:  3  . acetaminophen-codeine (TYLENOL #3) 300-30 MG tablet    Sig: Take 1 tablet by mouth every 8 (eight) hours as needed.    Dispense:  90 tablet    Refill:  0  . losartan (COZAAR) 25 MG tablet    Sig: Take 1 tablet (25 mg total) by mouth  daily.    Dispense:  30 tablet    Refill:  5  . cyclobenzaprine (FLEXERIL) 10 MG tablet    Sig: Take 1 tablet (10 mg total) by mouth 3 (three) times daily as needed for muscle spasms.    Dispense:  30 tablet    Refill:  0    Follow-up: No Follow-up on file.   Dessa Phi MD

## 2015-04-30 NOTE — Assessment & Plan Note (Signed)
A: chronic low back pain with muscle spasm in morbidly obese patient P: Flexeril Refilled tylenol #3  Lumbar x-ray  Weight loss

## 2015-04-30 NOTE — Assessment & Plan Note (Signed)
A: persistently high BP on chlorthalidone and metoprolol P: D/c chlorthalidone Start losartan 25 mg daily Continue metoprolol

## 2015-04-30 NOTE — Patient Instructions (Addendum)
Rickey Calderon was seen today for hypertension and joint swelling.  Diagnoses and all orders for this visit:  Essential hypertension -     Discontinue: chlorthalidone (HYGROTON) 50 MG tablet; Take 1 tablet (50 mg total) by mouth daily. -     Uric Acid -     losartan (COZAAR) 25 MG tablet; Take 1 tablet (25 mg total) by mouth daily. -     Basic Metabolic Panel  Chronic low back pain -     acetaminophen-codeine (TYLENOL #3) 300-30 MG tablet; Take 1 tablet by mouth every 8 (eight) hours as needed. -     cyclobenzaprine (FLEXERIL) 10 MG tablet; Take 1 tablet (10 mg total) by mouth 3 (three) times daily as needed for muscle spasms. -     DG Lumbar Spine 2-3 Views; Future  Idiopathic gout, unspecified chronicity, unspecified site -     Uric Acid  Financial difficulties  Other orders -     Fluticasone-Salmeterol (ADVAIR) 100-50 MCG/DOSE AEPB; Inhale 1 puff into the lungs 2 (two) times daily.   F/u in 4 weeks for HTN with pharmacy team  Schedule an appt to speak with [name] about financial stressors and possible resources  Colonoscopy, GI referral for screening colonoscopy when you turn 49.   F/u with me in 2 months for HTN and chronic low back pain   Dr. Armen PickupFunches

## 2015-04-30 NOTE — Progress Notes (Signed)
F/U Edema, HTN No medication taken today  Tylenol #3 refill Pain Scale #

## 2015-05-25 ENCOUNTER — Ambulatory Visit: Payer: Medicaid Other | Admitting: Dietician

## 2015-06-11 ENCOUNTER — Telehealth: Payer: Self-pay | Admitting: Family Medicine

## 2015-06-11 NOTE — Telephone Encounter (Signed)
Pt. Called requesting a med refill on Tylenol #3 and Flexeril. Please f/u with pt.

## 2015-06-15 NOTE — Telephone Encounter (Signed)
Patient called to request a med refill for Tylenol #3 and Flexeril. Please f/u

## 2015-06-16 ENCOUNTER — Ambulatory Visit: Payer: Medicaid Other | Admitting: Dietician

## 2015-06-16 NOTE — Telephone Encounter (Signed)
Patient called to request a med refill for Tylenol #3 and Flexeril. Please f/u  °

## 2015-06-17 NOTE — Telephone Encounter (Signed)
Patient called and requested a med refill for Tylenol #3 and Flexeril. Please f/u

## 2015-06-18 ENCOUNTER — Other Ambulatory Visit: Payer: Self-pay

## 2015-06-18 ENCOUNTER — Other Ambulatory Visit: Payer: Self-pay | Admitting: *Deleted

## 2015-06-18 DIAGNOSIS — M545 Low back pain, unspecified: Secondary | ICD-10-CM

## 2015-06-18 DIAGNOSIS — G8929 Other chronic pain: Secondary | ICD-10-CM

## 2015-06-18 MED ORDER — CYCLOBENZAPRINE HCL 10 MG PO TABS: 10.0000 mg | ORAL_TABLET | Freq: Three times a day (TID) | ORAL | Status: DC | PRN

## 2015-06-18 MED ORDER — ACETAMINOPHEN-CODEINE #3 300-30 MG PO TABS: 1.0000 | ORAL_TABLET | Freq: Three times a day (TID) | ORAL | Status: DC | PRN

## 2015-06-18 NOTE — Telephone Encounter (Signed)
Pt. Called requesting a med refill on Tylenol #3 and Flexeril. Please f/u with pt. °

## 2015-06-18 NOTE — Telephone Encounter (Signed)
Rx Flexeril refill send to [facility]  Tylenol #3 at front office  Pt aware

## 2015-06-18 NOTE — Telephone Encounter (Signed)
Patient requesting refills for flexeril and Tylenol 3.  Nurse will send refill request to Dr. Armen PickupFunches.

## 2015-06-18 NOTE — Telephone Encounter (Signed)
Nurse called patient, patient verified date of birth. Patient aware of nurse sending medication request to Dr. Armen PickupFunches. Patient voices understanding and has no further questions at this time.

## 2015-07-07 ENCOUNTER — Ambulatory Visit: Payer: Medicaid Other | Admitting: Dietician

## 2015-07-10 ENCOUNTER — Emergency Department (HOSPITAL_COMMUNITY)
Admission: EM | Admit: 2015-07-10 | Discharge: 2015-07-10 | Disposition: A | Discharge status: Home / Self Care | Payer: Medicaid Other | Attending: Emergency Medicine | Admitting: Emergency Medicine

## 2015-07-10 ENCOUNTER — Encounter: Payer: Self-pay | Admitting: Family Medicine

## 2015-07-10 ENCOUNTER — Encounter (HOSPITAL_COMMUNITY): Payer: Self-pay

## 2015-07-10 ENCOUNTER — Ambulatory Visit: Payer: Medicaid Other | Attending: Family Medicine | Admitting: Family Medicine

## 2015-07-10 VITALS — BP 140/90 | HR 88 | Temp 98.0°F | Resp 16 | Ht 67.0 in | Wt 374.0 lb

## 2015-07-10 DIAGNOSIS — M545 Low back pain: Secondary | ICD-10-CM | POA: Insufficient documentation

## 2015-07-10 DIAGNOSIS — Z6841 Body Mass Index (BMI) 40.0 and over, adult: Secondary | ICD-10-CM | POA: Diagnosis not present

## 2015-07-10 DIAGNOSIS — I10 Essential (primary) hypertension: Secondary | ICD-10-CM | POA: Insufficient documentation

## 2015-07-10 DIAGNOSIS — Z79899 Other long term (current) drug therapy: Secondary | ICD-10-CM | POA: Insufficient documentation

## 2015-07-10 DIAGNOSIS — Z0000 Encounter for general adult medical examination without abnormal findings: Secondary | ICD-10-CM | POA: Diagnosis not present

## 2015-07-10 DIAGNOSIS — Y998 Other external cause status: Secondary | ICD-10-CM | POA: Insufficient documentation

## 2015-07-10 DIAGNOSIS — G8929 Other chronic pain: Secondary | ICD-10-CM | POA: Diagnosis present

## 2015-07-10 DIAGNOSIS — F32A Depression, unspecified: Secondary | ICD-10-CM | POA: Insufficient documentation

## 2015-07-10 DIAGNOSIS — H9222 Otorrhagia, left ear: Secondary | ICD-10-CM | POA: Insufficient documentation

## 2015-07-10 DIAGNOSIS — X58XXXA Exposure to other specified factors, initial encounter: Secondary | ICD-10-CM | POA: Insufficient documentation

## 2015-07-10 DIAGNOSIS — Z7901 Long term (current) use of anticoagulants: Secondary | ICD-10-CM | POA: Insufficient documentation

## 2015-07-10 DIAGNOSIS — F329 Major depressive disorder, single episode, unspecified: Secondary | ICD-10-CM | POA: Insufficient documentation

## 2015-07-10 DIAGNOSIS — R51 Headache: Secondary | ICD-10-CM | POA: Insufficient documentation

## 2015-07-10 DIAGNOSIS — Y9289 Other specified places as the place of occurrence of the external cause: Secondary | ICD-10-CM | POA: Insufficient documentation

## 2015-07-10 DIAGNOSIS — R42 Dizziness and giddiness: Secondary | ICD-10-CM | POA: Diagnosis not present

## 2015-07-10 DIAGNOSIS — S09302A Unspecified injury of left middle and inner ear, initial encounter: Secondary | ICD-10-CM | POA: Diagnosis present

## 2015-07-10 DIAGNOSIS — Y9389 Activity, other specified: Secondary | ICD-10-CM | POA: Insufficient documentation

## 2015-07-10 LAB — PROTIME-INR
INR: 1.77 — AB (ref 0.00–1.49)
Prothrombin Time: 20.6 seconds — ABNORMAL HIGH (ref 11.6–15.2)

## 2015-07-10 LAB — POCT GLYCOSYLATED HEMOGLOBIN (HGB A1C): HEMOGLOBIN A1C: 5.4

## 2015-07-10 MED ORDER — SERTRALINE HCL 50 MG PO TABS: 50.0000 mg | ORAL_TABLET | Freq: Every day | ORAL | Status: DC

## 2015-07-10 MED ORDER — LOSARTAN POTASSIUM 50 MG PO TABS: 50.0000 mg | ORAL_TABLET | Freq: Every day | ORAL | Status: DC

## 2015-07-10 MED ORDER — METOPROLOL TARTRATE 25 MG PO TABS: 25.0000 mg | ORAL_TABLET | Freq: Two times a day (BID) | ORAL | Status: DC

## 2015-07-10 MED FILL — METOPROLOL TARTRATE 25 MG T: 25 | 30 days supply | Qty: 60 | Fill #0

## 2015-07-10 MED FILL — LOSARTAN POTASSIUM 50 MG TA: 50 | 30 days supply | Qty: 30 | Fill #0

## 2015-07-10 MED FILL — SERTRALINE HCL 50 MG TABLET: 50 | 30 days supply | Qty: 30 | Fill #0

## 2015-07-10 NOTE — Progress Notes (Signed)
Patient ID: Rickey Calderon, male   DOB: Jan 26, 1966, 50 y.o.   MRN: [id number]   Subjective:  Patient ID: Rickey MazeLennard Filippi, male    DOB: Jan 26, 1966  Age: 50 y.o. MRN: [id number]  CC: Follow-up   HPI Rickey MazeLennard Huante presents for   1. HTN: HA and dizziness at time. SOB with exertion. No CP or edema.  2. Depression:requesting to restart zoloft. Was up to 200 mg daily. Feeling irritable. No SI. Irritability interferes with relationship with wife. Unemployed.   3. Chronic low back pain: morbidly obese. Working to lose weight. No falls, incontinence or groin numbness. Tylenol #3 helps with pain.  4. L ear bleeding: see ED note from early today. Still bleeding. No pain. Ear is packed. He is on his way to ENT from here.  Social History  Substance Use Topics  . Smoking status: Never Smoker   . Smokeless tobacco: Never Used  . Alcohol Use: No    Outpatient Prescriptions Prior to Visit  Medication Sig Dispense Refill  . acetaminophen-codeine (TYLENOL #3) 300-30 MG tablet Take 1 tablet by mouth every 8 (eight) hours as needed. (Patient taking differently: Take 1 tablet by mouth every 8 (eight) hours as needed for moderate pain. ) 90 tablet 0  . allopurinol (ZYLOPRIM) 300 MG tablet Take 1 tablet (300 mg total) by mouth daily. 30 tablet 3  . cetirizine (ZYRTEC) 10 MG tablet Take 1 tablet (10 mg total) by mouth daily. 30 tablet 11  . cyclobenzaprine (FLEXERIL) 10 MG tablet Take 1 tablet (10 mg total) by mouth 3 (three) times daily as needed for muscle spasms. 30 tablet 0  . Fluticasone-Salmeterol (ADVAIR) 100-50 MCG/DOSE AEPB Inhale 1 puff into the lungs 2 (two) times daily. 60 each 2  . gabapentin (NEURONTIN) 300 MG capsule Take 1 capsule (300 mg total) by mouth 3 (three) times daily. 90 capsule 3  . losartan (COZAAR) 25 MG tablet Take 1 tablet (25 mg total) by mouth daily. 30 tablet 5  . metoprolol tartrate (LOPRESSOR) 25 MG tablet Take 1 tablet (25 mg total) by mouth 2 (two) times daily. 60 tablet 5  .  rivaroxaban (XARELTO) 20 MG TABS tablet Take 1 tablet (20 mg total) by mouth daily with supper. 30 tablet 3  . tamsulosin (FLOMAX) 0.4 MG CAPS capsule Take 1 capsule (0.4 mg total) by mouth daily after supper. 30 capsule 3  . Multiple Vitamins-Minerals (MULTIVITAMIN) tablet Take 1 tablet by mouth daily. (Patient not taking: Reported on 07/10/2015) 30 tablet 11   No facility-administered medications prior to visit.    ROS Review of Systems  Constitutional: Negative for fever, chills, fatigue and unexpected weight change.  HENT: Positive for ear discharge (ear bleeding ). Negative for ear pain.   Eyes: Negative for visual disturbance.  Respiratory: Negative for cough and shortness of breath.   Cardiovascular: Negative for chest pain, palpitations and leg swelling.  Gastrointestinal: Negative for nausea, vomiting, abdominal pain, diarrhea, constipation and blood in stool.  Endocrine: Negative for polydipsia, polyphagia and polyuria.  Musculoskeletal: Positive for back pain. Negative for myalgias, arthralgias, gait problem and neck pain.  Skin: Negative for rash.  Allergic/Immunologic: Negative for immunocompromised state.  Neurological: Positive for dizziness and headaches.  Hematological: Negative for adenopathy. Does not bruise/bleed easily.  Psychiatric/Behavioral: Positive for dysphoric mood. Negative for suicidal ideas and sleep disturbance. The patient is not nervous/anxious.     Objective:  BP 140/90 mmHg  Pulse 88  Temp(Src) 98 F (36.7 C) (Oral)  Resp 16  Ht  5\' 7"  (1.702 m)  Wt 374 lb (169.645 kg)  BMI 58.56 kg/m2  SpO2 96%  BP/Weight 07/10/2015 07/10/2015 04/30/2015  Systolic BP 182 140 168  Diastolic BP 121 90 106  Wt. (Lbs) 380 374 379  BMI 59.5 58.56 59.35   Physical Exam  Constitutional: He appears well-developed and well-nourished. No distress.  HENT:  Head: Normocephalic and atraumatic.  Neck: Normal range of motion. Neck supple.  Cardiovascular: Normal rate,  regular rhythm, normal heart sounds and intact distal pulses.   Pulmonary/Chest: Effort normal and breath sounds normal.  Musculoskeletal: He exhibits no edema.  Neurological: He is alert.  Skin: Skin is warm and dry. No rash noted. No erythema.  Psychiatric: He has a normal mood and affect.   Depression screen [facility] 2/9 07/10/2015 04/30/2015 03/31/2015  Decreased Interest 0 0 0  Down, Depressed, Hopeless 1 1 0  PHQ - 2 Score 1 1 0  Altered sleeping 1 - -  Tired, decreased energy 1 - -  Change in appetite 1 - -  Feeling bad or failure about yourself  1 - -  Trouble concentrating 0 - -  Moving slowly or fidgety/restless 0 - -  Suicidal thoughts 0 - -  PHQ-9 Score 5 - -    GAD 7 : Generalized Anxiety Score 07/10/2015 04/30/2015  Nervous, Anxious, on Edge 0 0  Control/stop worrying 0 1  Worry too much - different things 1 1  Trouble relaxing 0 0  Restless 0 0  Easily annoyed or irritable 1 1  Afraid - awful might happen 0 0  Total GAD 7 Score 2 3      Lab Results  Component Value Date   HGBA1C 5.40 07/10/2015    Assessment & Plan:   Problem List Items Addressed This Visit    Chronic low back pain (Chronic)    Chronic, no red flags  Control pain as best as possible Weight loss       Depression (Chronic)   Relevant Medications   sertraline (ZOLOFT) 50 MG tablet   Hypertension (Chronic)    A: improved Med: compliant P: Increase losartan from 25 mg to 50 mg daily Continue metoprolol 25 mg BID       Relevant Medications   losartan (COZAAR) 50 MG tablet   metoprolol tartrate (LOPRESSOR) 25 MG tablet    Other Visit Diagnoses    Healthcare maintenance    -  Primary    Relevant Orders    POCT glycosylated hemoglobin (Hb A1C) (Completed)    Ambulatory referral to Gastroenterology       No orders of the defined types were placed in this encounter.    Follow-up: No Follow-up on file.   Dessa Phi MD

## 2015-07-10 NOTE — ED Provider Notes (Signed)
CSN: [id number]     Arrival date & time 07/10/15  0756 History   First MD Initiated Contact with Patient 07/10/15 0840     Chief Complaint  Patient presents with  . left ear injury    (Consider location/radiation/quality/duration/timing/severity/associated sxs/prior Treatment) The history is provided by the patient. No language interpreter was used.    Rickey Calderon is a 50 year old male with a history of DVT (anticoagulated with Xarelto), hypertension, gout, and, depression who presents via EMS for bleeding from the left ear after placing a Q-tip in the ear around 9 hours ago. He states that there is been ongoing bleeding since the incident. He denies putting anything else into the ear. States that he can hear the blood inside the ear canal.  He denies any hearing loss, ringing in the ears, drainage from the ear.  Past Medical History  Diagnosis Date  . Hypertension   . Gout   . Prostatitis   . Bronchitis   . Depression   . Seasonal allergies    Past Surgical History  Procedure Laterality Date  . Abdominal aortagram N/A 07/28/2014    Procedure: ABDOMINAL Ronny Flurry;  Surgeon: Chuck Hint, MD;  Location: [facility] CATH LAB;  Service: Cardiovascular;  Laterality: N/A;  . Lower extremity angiogram Bilateral 07/28/2014    Procedure: LOWER EXTREMITY ANGIOGRAM;  Surgeon: Chuck Hint, MD;  Location: [facility] CATH LAB;  Service: Cardiovascular;  Laterality: Bilateral;  . Thrombectomy femoral artery Right 08/01/2014    Procedure: THROMBECTOMY RIGHT LEG SFA, Anterior tibial, and perineal arteries with intraoperative arteriograms;  Surgeon: Nada Libman, MD;  Location: [facility] OR;  Service: Vascular;  Laterality: Right;   Family History  Problem Relation Age of Onset  . Diabetes Father   . Hyperlipidemia Father    Social History  Substance Use Topics  . Smoking status: Never Smoker   . Smokeless tobacco: Never Used  . Alcohol Use: No    Review of Systems  Eyes: Positive for discharge.  Negative for pain and redness.  All other systems reviewed and are negative.     Allergies  Review of patient's allergies indicates no known allergies.  Home Medications   Prior to Admission medications   Medication Sig Start Date End Date Taking? Authorizing Provider  acetaminophen-codeine (TYLENOL #3) 300-30 MG tablet Take 1 tablet by mouth every 8 (eight) hours as needed. Patient taking differently: Take 1 tablet by mouth every 8 (eight) hours as needed for moderate pain.  06/18/15  Yes Josalyn Funches, MD  allopurinol (ZYLOPRIM) 300 MG tablet Take 1 tablet (300 mg total) by mouth daily. 03/31/15  Yes Josalyn Funches, MD  cetirizine (ZYRTEC) 10 MG tablet Take 1 tablet (10 mg total) by mouth daily. 11/18/14  Yes Doris Cheadle, MD  cyclobenzaprine (FLEXERIL) 10 MG tablet Take 1 tablet (10 mg total) by mouth 3 (three) times daily as needed for muscle spasms. 06/18/15  Yes Josalyn Funches, MD  Fluticasone-Salmeterol (ADVAIR) 100-50 MCG/DOSE AEPB Inhale 1 puff into the lungs 2 (two) times daily. 04/30/15  Yes Josalyn Funches, MD  gabapentin (NEURONTIN) 300 MG capsule Take 1 capsule (300 mg total) by mouth 3 (three) times daily. 03/31/15  Yes Josalyn Funches, MD  Multiple Vitamins-Minerals (MULTIVITAMIN) tablet Take 1 tablet by mouth daily. Patient not taking: Reported on 07/10/2015 04/30/15  Yes Josalyn Funches, MD  rivaroxaban (XARELTO) 20 MG TABS tablet Take 1 tablet (20 mg total) by mouth daily with supper. 03/31/15  Yes Josalyn Funches, MD  tamsulosin (FLOMAX) 0.4 MG  CAPS capsule Take 1 capsule (0.4 mg total) by mouth daily after supper. 03/31/15  Yes Josalyn Funches, MD  losartan (COZAAR) 50 MG tablet Take 1 tablet (50 mg total) by mouth daily. 07/10/15   Josalyn Funches, MD  metoprolol tartrate (LOPRESSOR) 25 MG tablet Take 1 tablet (25 mg total) by mouth 2 (two) times daily. 07/10/15   Josalyn Funches, MD  sertraline (ZOLOFT) 50 MG tablet Take 1 tablet (50 mg total) by mouth daily. 07/10/15    Josalyn Funches, MD   BP 182/121 mmHg  Pulse 78  Temp(Src) 97.7 F (36.5 C) (Oral)  Resp 18  Ht 5\' 7"  (1.702 m)  Wt 172.367 kg  BMI 59.50 kg/m2  SpO2 96% Physical Exam  Constitutional: He is oriented to person, place, and time. He appears well-developed and well-nourished.  Morbidly obese.  HENT:  Head: Normocephalic and atraumatic.  Right Ear: Tympanic membrane and external ear normal.  Left ear is actively bleeding. TM could not be visualized.  Right ear: TM and canal are normal.  Oropharynx is clear and moist and without blood. No anterior cervical lymphadenopathy.  Eyes: Conjunctivae are normal.  Neck: Normal range of motion. Neck supple.  Cardiovascular: Normal rate.   Pulmonary/Chest: Effort normal.  Musculoskeletal: Normal range of motion.  Neurological: He is alert and oriented to person, place, and time.  Skin: Skin is warm and dry.  Psychiatric: He has a normal mood and affect.  Nursing note and vitals reviewed.   ED Course  Procedures (including critical care time) Labs Review Labs Reviewed  PROTIME-INR - Abnormal; Notable for the following:    Prothrombin Time 20.6 (*)    INR 1.77 (*)    All other components within normal limits    Imaging Review No results found.   EKG Interpretation None      MDM   Final diagnoses:  Ear bleeding, left   Patient presents for bleeding of left ear after cleaning it with a Q-tip around 9 hours ago. Patient is on Xarelto for DVT prophylaxis. He reports that the ear has been actively bleeding. He denies any ear pain, ringing in the ears, or discharge from the ears yesterday. The TM could not be visualized but I do not suspect that the TM is perforated. There is pooling of the blood. Patient denies any dizziness, lightheadedness, palpitations or shortness of breath.   I discussed this patient with Dr. Verdie MosherLiu who has seen and evaluated the patient.  Quik clot gauze was applied to the canal after many attempts to stop the  bleeding with gauze. I spoke to Dr. Suszanne Connerseoh with ENT who recommended putting gauze in the ear and sending the patient to his office at 1:00 today. He denied any other treatments at this time. I discussed this with the patient who is agreeable. Patient is stable and vital signs are well appearing. Medications - No data to display Filed Vitals:   07/10/15 0803 07/10/15 0955  BP: 162/111 182/121  Pulse: 88 78  Temp: 97.6 F (36.4 C) 97.7 F (36.5 C)  Resp: 14 [id number]18       Rickey Gunkel Patel-Mills, PA-C 07/10/15 1431  Lavera Guiseana Duo Liu, MD 07/10/15 1605

## 2015-07-10 NOTE — ED Provider Notes (Signed)
Medical screening examination/treatment/procedure(s) were conducted as a shared visit with non-physician practitioner(s) and myself.  I personally evaluated the patient during the encounter.   EKG Interpretation None      50 year old male who presents with bleeding from left TM. History of DVT on Xarelto. Cleaning out ears with Q-tip last night. Bleeding from left ear canal. No significant pain, but unable to stop bleeding. Unable to visualize TM due to persistent pooling of blood within canal. Less likely TM perforation given no pain or symptoms suggestive of such. Packed with quick clot gauze and discussed with ENT, who recommended discharge to ENT clinic for evaluation and management. Discussed with patient who was agreeable to plan.   Rickey Guiseana Duo Akilah Cureton, MD 07/10/15 1027

## 2015-07-10 NOTE — Progress Notes (Signed)
F/U medication refills  Referral for colonoscopy  No pain today  No tobacco user  No suicidal thought in the past two weeks

## 2015-07-10 NOTE — Patient Instructions (Addendum)
Rickey Calderon was seen today for follow-up.  Diagnoses and all orders for this visit:  Healthcare maintenance -     POCT glycosylated hemoglobin (Hb A1C)  Essential hypertension -     losartan (COZAAR) 50 MG tablet; Take 1 tablet (50 mg total) by mouth daily.  Depression -     sertraline (ZOLOFT) 50 MG tablet; Take 1 tablet (50 mg total) by mouth daily.    F/u in 4-6 weeks for depression and HTN  Dr. Armen PickupFunches   Low out cost optometrist (about $65.00 for office visit)  1. Rickey Calderon Phone # [phone] [address]  [place], [place] [address]   2. [facility]  Phone # [phone] [address] [place], [place] [address]

## 2015-07-10 NOTE — ED Notes (Addendum)
Patient here by EMS after developing left ear bleeding last pm after using q-tip in same, denies pain, no hearing change, bleeding on arrival. Patient is on blood thinner

## 2015-07-10 NOTE — Assessment & Plan Note (Signed)
Chronic, no red flags  Control pain as best as possible Weight loss

## 2015-07-10 NOTE — Assessment & Plan Note (Signed)
A: improved Med: compliant P: Increase losartan from 25 mg to 50 mg daily Continue metoprolol 25 mg BID

## 2015-07-10 NOTE — Discharge Instructions (Signed)
Follow up with Dr. Suszanne Connerseoh in the office today at 1:00.

## 2015-07-28 ENCOUNTER — Telehealth: Payer: Self-pay

## 2015-07-28 DIAGNOSIS — G8929 Other chronic pain: Secondary | ICD-10-CM

## 2015-07-28 DIAGNOSIS — M545 Low back pain, unspecified: Secondary | ICD-10-CM

## 2015-07-28 NOTE — Telephone Encounter (Signed)
Patient called requesting a refill on his tylenol #3 and flexeril Please follow up with patient

## 2015-07-29 ENCOUNTER — Other Ambulatory Visit: Payer: Self-pay | Admitting: Family Medicine

## 2015-07-29 ENCOUNTER — Telehealth: Payer: Self-pay

## 2015-07-29 DIAGNOSIS — G8929 Other chronic pain: Secondary | ICD-10-CM

## 2015-07-29 DIAGNOSIS — M545 Low back pain: Principal | ICD-10-CM

## 2015-07-29 MED ORDER — GABAPENTIN 300 MG PO CAPS: 300.0000 mg | ORAL_CAPSULE | Freq: Three times a day (TID) | ORAL | Status: DC

## 2015-07-29 MED FILL — XARELTO 20 MG TABLET: 20 | 30 days supply | Qty: 30 | Fill #0

## 2015-07-29 MED FILL — ALLOPURINOL 300 MG TABLET: 300 | 30 days supply | Qty: 30 | Fill #0

## 2015-07-29 MED FILL — GABAPENTIN 300 MG CAPSULE: 300 | 1 days supply | Qty: 90 | Fill #0

## 2015-07-29 MED FILL — TAMSULOSIN HCL 0.4 MG CAP: 0.4 | 30 days supply | Qty: 30 | Fill #1

## 2015-07-29 MED FILL — ALL DAY ALLERGY 10 MG TAB: 10 | 30 days supply | Qty: 30 | Fill #8

## 2015-07-29 NOTE — Telephone Encounter (Signed)
Returned patient phone call Patient is requesting refills on his gabapentin xarelto and allopurinol xarelto and allopurinol have already been refilled New RX for gabapentin sent to pharmacy

## 2015-07-30 ENCOUNTER — Telehealth: Payer: Self-pay

## 2015-07-30 ENCOUNTER — Telehealth: Payer: Self-pay | Admitting: Family Medicine

## 2015-07-30 MED ORDER — ACETAMINOPHEN-CODEINE #3 300-30 MG PO TABS: 1.0000 | ORAL_TABLET | Freq: Three times a day (TID) | ORAL | Status: DC | PRN

## 2015-07-30 MED ORDER — CYCLOBENZAPRINE HCL 10 MG PO TABS: 10.0000 mg | ORAL_TABLET | Freq: Three times a day (TID) | ORAL | Status: DC | PRN

## 2015-07-30 MED FILL — ACETAMINOPHEN/COD #3 TABLET: 300-30 | 30 days supply | Qty: 90 | Fill #0

## 2015-07-30 MED FILL — CYCLOBENZAPRINE 10 MG TAB: 10 | 10 days supply | Qty: 30 | Fill #0

## 2015-07-30 NOTE — Addendum Note (Signed)
Addended by: Dessa Phi on: 07/30/2015 11:48 AM   Modules accepted: Orders

## 2015-07-30 NOTE — Telephone Encounter (Signed)
Returned patient phone  Patient is aware he prescriptions have been refilled and he  Can pick up his tylenol #3 script at the front desk

## 2015-07-30 NOTE — Telephone Encounter (Signed)
Refilled tylenol #3 ready for pick up Please inform patient

## 2015-07-30 NOTE — Telephone Encounter (Signed)
Rx at front office ready to be pick up 

## 2015-08-11 MED FILL — CYCLOBENZAPRINE 10 MG TAB: 10 | 10 days supply | Qty: 30 | Fill #1

## 2015-08-11 MED FILL — LOSARTAN POTASSIUM 50 MG TA: 50 | 30 days supply | Qty: 30 | Fill #1

## 2015-08-11 MED FILL — METOPROLOL TARTRATE 25 MG T: 25 | 30 days supply | Qty: 60 | Fill #1

## 2015-08-11 MED FILL — SERTRALINE HCL 50 MG TABLET: 50 | 30 days supply | Qty: 30 | Fill #1

## 2015-08-20 ENCOUNTER — Ambulatory Visit (HOSPITAL_COMMUNITY)
Admission: RE | Admit: 2015-08-20 | Discharge: 2015-08-20 | Disposition: A | Discharge status: Home / Self Care | Payer: Medicaid Other | Source: Ambulatory Visit | Attending: Family Medicine | Admitting: Family Medicine

## 2015-08-20 ENCOUNTER — Encounter: Payer: Self-pay | Admitting: Clinical

## 2015-08-20 ENCOUNTER — Encounter: Payer: Self-pay | Admitting: Family Medicine

## 2015-08-20 ENCOUNTER — Ambulatory Visit (HOSPITAL_BASED_OUTPATIENT_CLINIC_OR_DEPARTMENT_OTHER): Payer: Medicaid Other | Admitting: Family Medicine

## 2015-08-20 VITALS — BP 165/126 | HR 81 | Temp 98.0°F | Resp 16 | Ht 67.0 in | Wt 368.0 lb

## 2015-08-20 DIAGNOSIS — R0602 Shortness of breath: Secondary | ICD-10-CM | POA: Insufficient documentation

## 2015-08-20 DIAGNOSIS — F329 Major depressive disorder, single episode, unspecified: Secondary | ICD-10-CM

## 2015-08-20 DIAGNOSIS — Z114 Encounter for screening for human immunodeficiency virus [HIV]: Secondary | ICD-10-CM | POA: Diagnosis not present

## 2015-08-20 DIAGNOSIS — F32A Depression, unspecified: Secondary | ICD-10-CM

## 2015-08-20 DIAGNOSIS — I10 Essential (primary) hypertension: Secondary | ICD-10-CM

## 2015-08-20 DIAGNOSIS — I517 Cardiomegaly: Secondary | ICD-10-CM | POA: Diagnosis not present

## 2015-08-20 DIAGNOSIS — Z23 Encounter for immunization: Secondary | ICD-10-CM | POA: Diagnosis not present

## 2015-08-20 DIAGNOSIS — J45909 Unspecified asthma, uncomplicated: Secondary | ICD-10-CM | POA: Insufficient documentation

## 2015-08-20 LAB — CBC
HEMATOCRIT: 42.6 % (ref 39.0–52.0)
Hemoglobin: 13.5 g/dL (ref 13.0–17.0)
MCH: 26.9 pg (ref 26.0–34.0)
MCHC: 31.7 g/dL (ref 30.0–36.0)
MCV: 84.9 fL (ref 78.0–100.0)
MPV: 9.3 fL (ref 8.6–12.4)
PLATELETS: 274 10*3/uL (ref 150–400)
RBC: 5.02 MIL/uL (ref 4.22–5.81)
RDW: 16 % — AB (ref 11.5–15.5)
WBC: 9.4 10*3/uL (ref 4.0–10.5)

## 2015-08-20 LAB — HIV ANTIBODY (ROUTINE TESTING W REFLEX): HIV 1&2 Ab, 4th Generation: NONREACTIVE

## 2015-08-20 MED ORDER — ALBUTEROL SULFATE HFA 108 (90 BASE) MCG/ACT IN AERS: 2.0000 | INHALATION_SPRAY | Freq: Four times a day (QID) | RESPIRATORY_TRACT | Status: DC | PRN

## 2015-08-20 MED ORDER — SERTRALINE HCL 100 MG PO TABS: 100.0000 mg | ORAL_TABLET | Freq: Every day | ORAL | Status: DC

## 2015-08-20 MED ORDER — METOPROLOL TARTRATE 50 MG PO TABS: 50.0000 mg | ORAL_TABLET | Freq: Two times a day (BID) | ORAL | Status: DC

## 2015-08-20 MED ORDER — LOSARTAN POTASSIUM 100 MG PO TABS: 100.0000 mg | ORAL_TABLET | Freq: Every day | ORAL | Status: DC

## 2015-08-20 MED FILL — SERTRALINE HCL 100 MG TAB: 100 | 30 days supply | Qty: 30 | Fill #0

## 2015-08-20 MED FILL — METOPROLOL TARTRATE 50 MG T: 50 | 30 days supply | Qty: 60 | Fill #0

## 2015-08-20 MED FILL — LOSARTAN POTASSIUM 100 MG T: 100 | 30 days supply | Qty: 30 | Fill #0

## 2015-08-20 MED FILL — PROAIR HFA 90 MCG INHALER: 108 (90 BAS | 30 days supply | Qty: 9 | Fill #0

## 2015-08-20 NOTE — Assessment & Plan Note (Signed)
A; improved P: Increase zoloft

## 2015-08-20 NOTE — Assessment & Plan Note (Signed)
A: SOB in obese, HTN patient improved with albuterol r/o CHF and CAD P: Increase BP meds for tighter control Labs and studies per orders Albuterol prn

## 2015-08-20 NOTE — Progress Notes (Signed)
F/U depression, HTN Elevated BP at OV, stated took medication at 09:30 C/C SHOB x couple of weeks  No pain today  No tobacco user  No suicidal thoughts in the past two weeks

## 2015-08-20 NOTE — Assessment & Plan Note (Signed)
A; elevated BP P: Increase losartan and metoprolol

## 2015-08-20 NOTE — Progress Notes (Signed)
Subjective:  Patient ID: Rickey Calderon, male    DOB: [birth year]  Age: 50 y.o. MRN: [id number]  CC: Shortness of Breath and Depression   HPI Lavoris Sparling presents for    1. Depression follow up: no SI. Still depressed. Request zoloft dose increase.  2. SOB: x 2 months. With rest and with exertion. No CP. Complaint with BP medicine. No CPAP for past 2 weeks or so as he needs new tubing. No fever or chills. No cough. Has hx of asthma.   Social History  Substance Use Topics  . Smoking status: Never Smoker   . Smokeless tobacco: Never Used  . Alcohol Use: No    Outpatient Prescriptions Prior to Visit  Medication Sig Dispense Refill  . acetaminophen-codeine (TYLENOL #3) 300-30 MG tablet Take 1 tablet by mouth every 8 (eight) hours as needed for moderate pain. 90 tablet 0  . allopurinol (ZYLOPRIM) 300 MG tablet TAKE 1 TABLET BY MOUTH DAILY 30 tablet 3  . cetirizine (ZYRTEC) 10 MG tablet Take 1 tablet (10 mg total) by mouth daily. 30 tablet 11  . cyclobenzaprine (FLEXERIL) 10 MG tablet Take 1 tablet (10 mg total) by mouth 3 (three) times daily as needed for muscle spasms. 30 tablet 1  . Fluticasone-Salmeterol (ADVAIR) 100-50 MCG/DOSE AEPB Inhale 1 puff into the lungs 2 (two) times daily. 60 each 2  . gabapentin (NEURONTIN) 300 MG capsule Take 1 capsule (300 mg total) by mouth 3 (three) times daily. 90 capsule 3  . losartan (COZAAR) 50 MG tablet Take 1 tablet (50 mg total) by mouth daily. 30 tablet 5  . metoprolol tartrate (LOPRESSOR) 25 MG tablet Take 1 tablet (25 mg total) by mouth 2 (two) times daily. 60 tablet 5  . sertraline (ZOLOFT) 50 MG tablet Take 1 tablet (50 mg total) by mouth daily. 30 tablet 3  . tamsulosin (FLOMAX) 0.4 MG CAPS capsule Take 1 capsule (0.4 mg total) by mouth daily after supper. 30 capsule 3  . XARELTO 20 MG TABS tablet TAKE 1 TABLET BY MOUTH DAILY WITH SUPPER. 30 tablet 3  . Multiple Vitamins-Minerals (MULTIVITAMIN) tablet Take 1 tablet by mouth daily. (Patient  not taking: Reported on 08/20/2015) 30 tablet 11   No facility-administered medications prior to visit.    ROS Review of Systems  Constitutional: Negative for fever, chills, fatigue and unexpected weight change.  HENT: Negative for ear discharge and ear pain.   Eyes: Negative for visual disturbance.  Respiratory: Positive for shortness of breath. Negative for cough.   Cardiovascular: Negative for chest pain, palpitations and leg swelling.  Gastrointestinal: Negative for nausea, vomiting, abdominal pain, diarrhea, constipation and blood in stool.  Endocrine: Negative for polydipsia, polyphagia and polyuria.  Musculoskeletal: Positive for back pain. Negative for myalgias, arthralgias, gait problem and neck pain.  Skin: Negative for rash.  Allergic/Immunologic: Negative for immunocompromised state.  Neurological: Positive for headaches. Negative for dizziness.  Hematological: Negative for adenopathy. Does not bruise/bleed easily.  Psychiatric/Behavioral: Positive for dysphoric mood. Negative for suicidal ideas and sleep disturbance. The patient is not nervous/anxious.     Objective:  BP 165/126 mmHg  Pulse 81  Temp(Src) 98 F (36.7 C) (Oral)  Resp 16  Ht  (1.702 m)  Wt 368 lb (166.924 kg)  BMI 57.62 kg/m2  SpO2 86% with ambulation, 96% at rest   BP/Weight 08/20/2015 07/10/2015 07/10/2015  Systolic BP 165 182 140  Diastolic BP 126 121 90  Wt. (Lbs) 368 380 374  BMI 57.62 59.5 58.56  Physical Exam  Constitutional: He appears well-developed and well-nourished. No distress.  HENT:  Head: Normocephalic and atraumatic.  Neck: Normal range of motion. Neck supple.  Cardiovascular: Normal rate, regular rhythm, normal heart sounds and intact distal pulses.   Pulmonary/Chest: Effort normal. He has wheezes (L sided ).  Musculoskeletal: He exhibits no edema.  Neurological: He is alert.  Skin: Skin is warm and dry. No rash noted. No erythema.  Psychiatric: He has a normal mood and  affect.   Albuterol breathing treatment x one given  Patient walked again O2 sat low at 95  Depression screen [facility] 2/9 08/20/2015 07/10/2015 04/30/2015  Decreased Interest 0 0 0  Down, Depressed, Hopeless PHQ - 2 Score Altered sleeping 1 1 -  Tired, decreased energy 1 1 -  Change in appetite 1 1 -  Feeling bad or failure about yourself  1 1 -  Trouble concentrating 0 0 -  Moving slowly or fidgety/restless 0 0 -  Suicidal thoughts 0 0 -  PHQ-9 Score 5 5 -    GAD 7 : Generalized Anxiety Score 08/20/2015 07/10/2015 04/30/2015  Nervous, Anxious, on Edge 0 0 0  Control/stop worrying 0 0 1  Worry too much - different things 0 1 1  Trouble relaxing 0 0 0  Restless 0 0 0  Easily annoyed or irritable Afraid - awful might happen 0 0 0  Total GAD 7 Score Assessment & Plan:   Birney was seen today for shortness of breath and depression.  Diagnoses and all orders for this visit:  Shortness of breath -     Pro b natriuretic peptide -     CBC -     DG Chest 2 View; Future -     Echocardiogram; Future -     Ambulatory referral to Cardiology -     albuterol (PROVENTIL HFA;VENTOLIN HFA) 108 (90 Base) MCG/ACT inhaler; Inhale 2 puffs into the lungs every 6 (six) hours as needed for wheezing or shortness of breath.  Essential hypertension -     losartan (COZAAR) 100 MG tablet; Take 1 tablet (100 mg total) by mouth daily. -     metoprolol tartrate (LOPRESSOR) 50 MG tablet; Take 1 tablet (50 mg total) by mouth 2 (two) times daily.  Depression -     sertraline (ZOLOFT) 100 MG tablet; Take 1 tablet (100 mg total) by mouth daily.  Screening for HIV (human immunodeficiency virus) -     HIV antibody (with reflex)  Other orders -     Tdap vaccine greater than or equal to 7yo IM   Follow-up: No Follow-up on file.   Dessa Phi MD

## 2015-08-20 NOTE — Patient Instructions (Addendum)
Rickey Calderon was seen today for shortness of breath and depression.  Diagnoses and all orders for this visit:  Shortness of breath -     Pro b natriuretic peptide -     CBC -     DG Chest 2 View; Future -     Echocardiogram; Future -     Ambulatory referral to Cardiology -     albuterol (PROVENTIL HFA;VENTOLIN HFA) 108 (90 Base) MCG/ACT inhaler; Inhale 2 puffs into the lungs every 6 (six) hours as needed for wheezing or shortness of breath.  Essential hypertension -     losartan (COZAAR) 100 MG tablet; Take 1 tablet (100 mg total) by mouth daily. -     metoprolol tartrate (LOPRESSOR) 50 MG tablet; Take 1 tablet (50 mg total) by mouth 2 (two) times daily.  Depression -     sertraline (ZOLOFT) 100 MG tablet; Take 1 tablet (100 mg total) by mouth daily.  Screening for HIV (human immunodeficiency virus) -     HIV antibody (with reflex)  Other orders -     Tdap vaccine greater than or equal to 7yo IM   You will be called with ECHO appointment and cardiology appointment  F/u in 3 weeks for SOB  Dr. Armen Pickup

## 2015-08-20 NOTE — Progress Notes (Signed)
Depression screen [facility] 2/9 08/20/2015 07/10/2015 04/30/2015 03/31/2015  Decreased Interest 0 0 0 0  Down, Depressed, Hopeless 0  PHQ - 2 Score 0  Altered sleeping 1 1 - -  Tired, decreased energy 1 1 - -  Change in appetite 1 1 - -  Feeling bad or failure about yourself  1 1 - -  Trouble concentrating 0 0 - -  Moving slowly or fidgety/restless 0 0 - -  Suicidal thoughts 0 0 - -  PHQ-9 Score 5 5 - -    GAD 7 : Generalized Anxiety Score 08/20/2015 07/10/2015 04/30/2015  Nervous, Anxious, on Edge 0 0 0  Control/stop worrying 0 0 1  Worry too much - different things 0 1 1  Trouble relaxing 0 0 0  Restless 0 0 0  Easily annoyed or irritable Afraid - awful might happen 0 0 0  Total GAD 7 Score 1 2 3

## 2015-08-21 ENCOUNTER — Telehealth: Payer: Self-pay

## 2015-08-21 ENCOUNTER — Telehealth: Payer: Self-pay | Admitting: Family Medicine

## 2015-08-21 DIAGNOSIS — I5041 Acute combined systolic (congestive) and diastolic (congestive) heart failure: Secondary | ICD-10-CM | POA: Insufficient documentation

## 2015-08-21 DIAGNOSIS — I509 Heart failure, unspecified: Secondary | ICD-10-CM

## 2015-08-21 LAB — PRO B NATRIURETIC PEPTIDE: Pro B Natriuretic peptide (BNP): 3271 pg/mL — ABNORMAL HIGH (ref <126)

## 2015-08-21 MED ORDER — FUROSEMIDE 40 MG PO TABS: 40.0000 mg | ORAL_TABLET | Freq: Two times a day (BID) | ORAL | Status: DC

## 2015-08-21 MED ORDER — POTASSIUM CHLORIDE CRYS ER 20 MEQ PO TBCR: 40.0000 meq | EXTENDED_RELEASE_TABLET | Freq: Every day | ORAL | Status: DC

## 2015-08-21 NOTE — Telephone Encounter (Signed)
Called back to patient. Verified name and DOB.  Gave information   Elevated proBNP and enlarged heart on CXR consistent with acute congestive heart failure.  Since your BP is not low and you are not having severe CP or SOB we will manage as an outpatient.  Start lasix 40 mg BID with potassium 40 mEq daily to remove excess fluid You can buy new tubing for your CPAP machine at a medical supply store.   I have written a script, be sure to use CPAP nightly as not using places strain on the R side of the heart.   Also advise patient to go to ED if he develops dizziness, lightheadedness persistent SOB or CP.  Patient agreed with plan and voiced understanding.

## 2015-08-21 NOTE — Telephone Encounter (Signed)
Patient had left voice mail that he missed your call And his computer is not working so he cannot  access my chart And would like a return phone call. Thanks

## 2015-08-21 NOTE — Telephone Encounter (Signed)
Pt. Called stating he received a call from his PCP. Pt. Sated he cannot see his results in my chart because his computer is not working. Please f/u with pt.

## 2015-08-21 NOTE — Telephone Encounter (Signed)
Called patient Left VM requesting call back Also left on VM that the info I needed to give him is in his mychart  If he calls, please tell him the following   Elevated proBNP and enlarged heart on CXR consistent with acute congestive heart failure.  Since your BP is not low and you are not having severe CP or SOB we will manage as an outpatient.  Start lasix 40 mg BID with potassium 40 mEq daily to remove excess fluid You can buy new tubing for your CPAP machine at a medical supply store I have written a script, be sure to use CPAP nightly as not using places strain on the R side of the heart.

## 2015-08-27 MED FILL — GABAPENTIN 300 MG CAPSULE: 300 | 1 days supply | Qty: 90 | Fill #1

## 2015-08-27 MED FILL — XARELTO 20 MG TABLET: 20 | 30 days supply | Qty: 30 | Fill #1

## 2015-08-27 MED FILL — ALLOPURINOL 300 MG TABLET: 300 | 30 days supply | Qty: 30 | Fill #1

## 2015-08-27 MED FILL — ALL DAY ALLERGY 10 MG TAB: 10 | 30 days supply | Qty: 30 | Fill #9

## 2015-08-27 MED FILL — TAMSULOSIN HCL 0.4 MG CAP: 0.4 | 30 days supply | Qty: 30 | Fill #2

## 2015-08-28 ENCOUNTER — Telehealth: Payer: Self-pay

## 2015-08-28 DIAGNOSIS — G8929 Other chronic pain: Secondary | ICD-10-CM

## 2015-08-28 DIAGNOSIS — M545 Low back pain, unspecified: Secondary | ICD-10-CM

## 2015-08-28 MED ORDER — ACETAMINOPHEN-CODEINE #3 300-30 MG PO TABS: 1.0000 | ORAL_TABLET | Freq: Three times a day (TID) | ORAL | Status: DC | PRN

## 2015-08-28 NOTE — Telephone Encounter (Signed)
Tylenol #3 ready for pick up   

## 2015-08-28 NOTE — Telephone Encounter (Signed)
Date of birth verified by pt  Results given  Elevated proBNP consistent with acute congestive heart failure.  Since your BP is not low and you are not having severe CP or SOB we will manage as an outpatient. Start lasix 40 mg BID with potassium 40 mEq daily to remove excess fluid You can buy new tubing for your CPAP machine at a medical supply store I have written a script, be sure to use CPAP nightly as not using places strain on the R side of the heart.   Pt stated has CP and SHOB was referred to Cardiology and  has appointment  Dr Armen PickupFunches spoke with him already  Advised to continue taking medication as prescribed

## 2015-08-28 NOTE — Telephone Encounter (Signed)
Returned patient phone call Patient is requesting a refill on his tylenol #3 Thanks

## 2015-08-28 NOTE — Addendum Note (Signed)
Addended by: Dessa PhiFUNCHES, Jaymarion Trombly on: 08/28/2015 04:41 PM   Modules accepted: Orders

## 2015-08-31 ENCOUNTER — Emergency Department (HOSPITAL_COMMUNITY)
Admission: EM | Admit: 2015-08-31 | Discharge: 2015-09-01 | Disposition: A | Discharge status: Home / Self Care | Payer: Medicaid Other | Attending: Physician Assistant | Admitting: Physician Assistant

## 2015-08-31 ENCOUNTER — Encounter (HOSPITAL_COMMUNITY): Payer: Self-pay | Admitting: Emergency Medicine

## 2015-08-31 ENCOUNTER — Emergency Department (HOSPITAL_COMMUNITY): Payer: Medicaid Other

## 2015-08-31 DIAGNOSIS — F329 Major depressive disorder, single episode, unspecified: Secondary | ICD-10-CM | POA: Insufficient documentation

## 2015-08-31 DIAGNOSIS — R0602 Shortness of breath: Secondary | ICD-10-CM | POA: Diagnosis present

## 2015-08-31 DIAGNOSIS — J45901 Unspecified asthma with (acute) exacerbation: Secondary | ICD-10-CM | POA: Diagnosis not present

## 2015-08-31 DIAGNOSIS — Z79899 Other long term (current) drug therapy: Secondary | ICD-10-CM | POA: Insufficient documentation

## 2015-08-31 DIAGNOSIS — I509 Heart failure, unspecified: Secondary | ICD-10-CM | POA: Diagnosis not present

## 2015-08-31 DIAGNOSIS — I10 Essential (primary) hypertension: Secondary | ICD-10-CM | POA: Insufficient documentation

## 2015-08-31 DIAGNOSIS — M109 Gout, unspecified: Secondary | ICD-10-CM | POA: Insufficient documentation

## 2015-08-31 HISTORY — DX: Heart failure, unspecified: I50.9

## 2015-08-31 LAB — CBC
HEMATOCRIT: 39.8 % (ref 39.0–52.0)
HEMOGLOBIN: 12.4 g/dL — AB (ref 13.0–17.0)
MCH: 26.7 pg (ref 26.0–34.0)
MCHC: 31.2 g/dL (ref 30.0–36.0)
MCV: 85.8 fL (ref 78.0–100.0)
Platelets: 250 10*3/uL (ref 150–400)
RBC: 4.64 MIL/uL (ref 4.22–5.81)
RDW: 15.4 % (ref 11.5–15.5)
WBC: 11.7 10*3/uL — ABNORMAL HIGH (ref 4.0–10.5)

## 2015-08-31 LAB — I-STAT TROPONIN, ED: TROPONIN I, POC: 0.03 ng/mL (ref 0.00–0.08)

## 2015-08-31 LAB — BASIC METABOLIC PANEL
Anion gap: 11 (ref 5–15)
BUN: 28 mg/dL — AB (ref 6–20)
CALCIUM: 8.7 mg/dL — AB (ref 8.9–10.3)
CHLORIDE: 102 mmol/L (ref 101–111)
CO2: 27 mmol/L (ref 22–32)
CREATININE: 1.81 mg/dL — AB (ref 0.61–1.24)
GFR calc non Af Amer: 42 mL/min — ABNORMAL LOW (ref >60)
GFR, EST AFRICAN AMERICAN: 49 mL/min — AB (ref >60)
Glucose, Bld: 124 mg/dL — ABNORMAL HIGH (ref 65–99)
Potassium: 3.4 mmol/L — ABNORMAL LOW (ref 3.5–5.1)
SODIUM: 140 mmol/L (ref 135–145)

## 2015-08-31 LAB — BRAIN NATRIURETIC PEPTIDE: B Natriuretic Peptide: 1024.5 pg/mL — ABNORMAL HIGH (ref 0.0–100.0)

## 2015-08-31 MED ORDER — FUROSEMIDE 20 MG PO TABS
40.0000 mg | ORAL_TABLET | Freq: Once | ORAL | Status: AC
  Administered 2015-08-31: 40 mg via ORAL
  Filled 2015-08-31: qty 2

## 2015-08-31 MED FILL — POTASSIUM CL ER 20 MEQ TAB: 20 | 30 days supply | Qty: 60 | Fill #0

## 2015-08-31 MED FILL — ACETAMINOPHEN/COD #3 TABLET: 300-30 | 30 days supply | Qty: 90 | Fill #0

## 2015-08-31 MED FILL — FUROSEMIDE 40 MG TABLET: 40 | 30 days supply | Qty: 60 | Fill #0

## 2015-08-31 NOTE — Telephone Encounter (Signed)
LVM Rx at front office 

## 2015-08-31 NOTE — ED Notes (Signed)
Pt presents to ER with [facility] for increasing SOB on excertion; pt reports scheduled for echocardiogram on 09/03/15; [facility]SS enroute; pt ambulatory from EMS stretcher to ER stretcher with steady gait

## 2015-08-31 NOTE — Discharge Instructions (Signed)
Please read and follow all provided instructions.  Your diagnoses today include:  1. Shortness of breath     Tests performed today include:  Vital signs. See below for your results today.   Medications prescribed:   None  Fill your prescribed Lasix   Home care instructions:  Follow any educational materials contained in this packet.  Follow-up instructions: Please follow-up with your cardiologist at scheduled appointment   Return instructions:   Please return to the Emergency Department if you do not get better, if you get worse, or new symptoms OR  - Fever (temperature greater than 101.34F)  - Bleeding that does not stop with holding pressure to the area    -Severe pain (please note that you may be more sore the day after your accident)  - Chest Pain  - Difficulty breathing  - Severe nausea or vomiting  - Inability to tolerate food and liquids  - Passing out  - Skin becoming red around your wounds  - Change in mental status (confusion or lethargy)  - New numbness or weakness     Please return if you have any other emergent concerns.  Additional Information:  Your vital signs today were: BP 149/119 mmHg   Pulse 71   Temp(Src) 97.9 F (36.6 C) (Oral)   Resp 20   SpO2 95% If your blood pressure (BP) was elevated above 135/85 this visit, please have this repeated by your doctor within one month. ---------------

## 2015-08-31 NOTE — ED Notes (Signed)
Pt back to A02 from radiology

## 2015-08-31 NOTE — ED Provider Notes (Signed)
CSN: [id number]     Arrival date & time 08/31/15  2050 History   First MD Initiated Contact with Patient 08/31/15 2108     Chief Complaint  Patient presents with  . Shortness of Breath   (Consider location/radiation/quality/duration/timing/severity/associated sxs/prior Treatment) HPI 50 y.o. male with a hx of HTN, CHF, presents to the Emergency Department today complaining of shortness of breath that started around 5pm today. No CP/ABD pain. No Cough. No fevers. States that he recently saw his PCP and diagnosed with CHF via Xray and BNP. Given Rx Lasix, but has not filled Rx since PCP visit. Has Echo scheduled on 09-03-15. Appt with Cardiology after Echo.   Past Medical History  Diagnosis Date  . Hypertension   . Gout   . Prostatitis   . Bronchitis   . Depression   . Seasonal allergies   . Asthma   . CHF (congestive heart failure) [facility](HCC)    Past Surgical History  Procedure Laterality Date  . Abdominal aortagram N/A 07/28/2014    Procedure: ABDOMINAL Ronny FlurryAORTAGRAM;  Surgeon: Chuck Hinthristopher S Dickson, MD;  [facility];  Service: Cardiovascular;  Laterality: N/A;  . Lower extremity angiogram Bilateral 07/28/2014    Procedure: LOWER EXTREMITY ANGIOGRAM;  Surgeon: Chuck Hinthristopher S Dickson, MD;  [facility];  Service: Cardiovascular;  Laterality: Bilateral;  . Thrombectomy femoral artery Right 08/01/2014    Procedure: THROMBECTOMY RIGHT LEG SFA, Anterior tibial, and perineal arteries with intraoperative arteriograms;  Surgeon: Nada LibmanVance W Brabham, MD;  [facility];  Service: Vascular;  Laterality: Right;   Family History  Problem Relation Age of Onset  . Diabetes Father   . Hyperlipidemia Father    Social History  Substance Use Topics  . Smoking status: Never Smoker   . Smokeless tobacco: Never Used  . Alcohol Use: No    Review of Systems ROS reviewed and all are negative for acute change except as noted in the HPI.  Allergies  Review of patient's allergies indicates no known  allergies.  Home Medications   Prior to Admission medications   Medication Sig Start Date End Date Taking? Authorizing Provider  acetaminophen-codeine (TYLENOL #3) 300-30 MG tablet Take 1 tablet by mouth every 8 (eight) hours as needed for moderate pain. 08/28/15   Josalyn Funches, MD  albuterol (PROVENTIL HFA;VENTOLIN HFA) 108 (90 Base) MCG/ACT inhaler Inhale 2 puffs into the lungs every 6 (six) hours as needed for wheezing or shortness of breath. 08/20/15   Josalyn Funches, MD  allopurinol (ZYLOPRIM) 300 MG tablet TAKE 1 TABLET BY MOUTH DAILY 07/29/15   Dessa PhiJosalyn Funches, MD  cetirizine (ZYRTEC) 10 MG tablet Take 1 tablet (10 mg total) by mouth daily. 11/18/14   Doris Cheadleeepak Advani, MD  cyclobenzaprine (FLEXERIL) 10 MG tablet Take 1 tablet (10 mg total) by mouth 3 (three) times daily as needed for muscle spasms. 07/30/15   Josalyn Funches, MD  Fluticasone-Salmeterol (ADVAIR) 100-50 MCG/DOSE AEPB Inhale 1 puff into the lungs 2 (two) times daily. 04/30/15   Josalyn Funches, MD  furosemide (LASIX) 40 MG tablet Take 1 tablet (40 mg total) by mouth 2 (two) times daily. 08/21/15   Josalyn Funches, MD  gabapentin (NEURONTIN) 300 MG capsule Take 1 capsule (300 mg total) by mouth 3 (three) times daily. 07/29/15   Josalyn Funches, MD  losartan (COZAAR) 100 MG tablet Take 1 tablet (100 mg total) by mouth daily. 08/20/15   Josalyn Funches, MD  metoprolol tartrate (LOPRESSOR) 50 MG tablet Take 1 tablet (50 mg total) by  mouth 2 (two) times daily. 08/20/15   Dessa Phi, MD  Multiple Vitamins-Minerals (MULTIVITAMIN) tablet Take 1 tablet by mouth daily. Patient not taking: Reported on 08/20/2015 04/30/15   Dessa Phi, MD  potassium chloride SA (K-DUR,KLOR-CON) 20 MEQ tablet Take 2 tablets (40 mEq total) by mouth daily. 08/21/15   Josalyn Funches, MD  sertraline (ZOLOFT) 100 MG tablet Take 1 tablet (100 mg total) by mouth daily. 08/20/15   Josalyn Funches, MD  tamsulosin (FLOMAX) 0.4 MG CAPS capsule Take 1 capsule (0.4 mg  total) by mouth daily after supper. 03/31/15   Josalyn Funches, MD  XARELTO 20 MG TABS tablet TAKE 1 TABLET BY MOUTH DAILY WITH SUPPER. 07/29/15   Josalyn Funches, MD   BP 150/118 mmHg  Pulse 73  Temp(Src) 97.9 F (36.6 C) (Oral)  Resp 25  SpO2 100%   Physical Exam  Constitutional: He is oriented to person, place, and time. He appears well-developed and well-nourished.  HENT:  Head: Normocephalic and atraumatic.  Eyes: EOM are normal. Pupils are equal, round, and reactive to light.  Neck: Normal range of motion. Neck supple.  Cardiovascular: Normal rate, regular rhythm and normal heart sounds.   Pulmonary/Chest: Effort normal and breath sounds normal. No respiratory distress.  Abdominal: Soft. There is no tenderness.  Musculoskeletal: Normal range of motion.  Neurological: He is alert and oriented to person, place, and time.  Skin: Skin is warm and dry.  Psychiatric: He has a normal mood and affect. His behavior is normal. Thought content normal.  Nursing note and vitals reviewed.  ED Course  Procedures (including critical care time) Labs Review Labs Reviewed  BASIC METABOLIC PANEL - Abnormal; Notable for the following:    Potassium 3.4 (*)    Glucose, Bld 124 (*)    BUN 28 (*)    Creatinine, Ser 1.81 (*)    Calcium 8.7 (*)    GFR calc non Af Amer 42 (*)    GFR calc Af Amer 49 (*)    All other components within normal limits  CBC - Abnormal; Notable for the following:    WBC 11.7 (*)    Hemoglobin 12.4 (*)    All other components within normal limits  BRAIN NATRIURETIC PEPTIDE - Abnormal; Notable for the following:    B Natriuretic Peptide 1024.5 (*)    All other components within normal limits  I-STAT TROPOININ, ED   Imaging Review Dg Chest 2 View  08/31/2015  CLINICAL DATA:  Short of breath EXAM: CHEST  2 VIEW COMPARISON:  08/20/2015 FINDINGS: Cardiac enlargement unchanged. Mild vascular congestion without edema or effusion. Negative for pneumonia. IMPRESSION: Cardiac  enlargement with mild vascular congestion similar to the prior study. Electronically Signed   By: Marlan Palau M.D.   On: 08/31/2015 21:41   I have personally reviewed and evaluated these images and lab results as part of my medical decision-making.   EKG Interpretation None      MDM  I have reviewed and evaluated the relevant laboratory values.I have reviewed and evaluated the relevant imaging studies.I personally evaluated and interpreted the relevant EKG.I have reviewed the relevant previous healthcare records.I have reviewed EMS Documentation.I obtained HPI from historian. Patient discussed with supervising physician  ED Course:  Assessment: 81y M presents with SOB that started today. Recent diagnosis of CHF. Has not filled Rx Lasix. On exam, pt in NAD. Hypertensive. Afebrile. No tachycardia. Lungs CTA. Heart RRR. Given PO Lasix . EKG unremarkable. Trop neg. BMP unremarkable. CBC shows mild leukocytosis.  BNP elevated 1024. CXR shows mild vascular congestion. Ambulated patient in ED without difficulty or hypoxia. At time of discharge, Patient is in no acute distress. Vital Signs are stable. Patient is able to ambulate. Patient able to tolerate PO.     Disposition/Plan:  DC Home Additional Verbal discharge instructions given and discussed with patient.  Pt Instructed to f/u with Cardiology at scheduled appointment  Return precautions given Pt acknowledges and agrees with plan  Supervising Physician Courteney Randall An, MD   Final diagnoses:  Shortness of breath       Audry Pili, PA-C 09/01/15 0007  Courteney Lyn Mackuen, MD 09/01/15 1642

## 2015-08-31 NOTE — ED Notes (Signed)
Patient transported to X-ray 

## 2015-09-01 NOTE — ED Notes (Signed)
Vance GatherShelley, House Coverage to speak with patient

## 2015-09-01 NOTE — ED Notes (Addendum)
Pt instructed to call around for a ride home; pt states "my wife and I dont have a car"

## 2015-09-01 NOTE — ED Notes (Signed)
Pt requesting CPAP tubing and taxi voucher

## 2015-09-01 NOTE — ED Notes (Signed)
Ambulated patient in hallway with pulse ox on room air. Pulse ox increased to 98% and dropped to 92% at lowest point. 94% resting on room air. Patient did become short of breath with exertion. Notified PA of results.

## 2015-09-03 ENCOUNTER — Ambulatory Visit (HOSPITAL_COMMUNITY): Payer: Medicaid Other | Attending: Cardiovascular Disease

## 2015-09-03 ENCOUNTER — Encounter (HOSPITAL_COMMUNITY): Payer: Self-pay

## 2015-09-03 ENCOUNTER — Ambulatory Visit (INDEPENDENT_AMBULATORY_CARE_PROVIDER_SITE_OTHER): Payer: Medicaid Other | Admitting: Internal Medicine

## 2015-09-03 ENCOUNTER — Other Ambulatory Visit: Payer: Self-pay

## 2015-09-03 VITALS — BP 142/114 | HR 84 | Resp 18

## 2015-09-03 DIAGNOSIS — E785 Hyperlipidemia, unspecified: Secondary | ICD-10-CM | POA: Insufficient documentation

## 2015-09-03 DIAGNOSIS — R0600 Dyspnea, unspecified: Secondary | ICD-10-CM | POA: Diagnosis present

## 2015-09-03 DIAGNOSIS — Z6841 Body Mass Index (BMI) 40.0 and over, adult: Secondary | ICD-10-CM | POA: Diagnosis not present

## 2015-09-03 DIAGNOSIS — E669 Obesity, unspecified: Secondary | ICD-10-CM | POA: Insufficient documentation

## 2015-09-03 DIAGNOSIS — I10 Essential (primary) hypertension: Secondary | ICD-10-CM

## 2015-09-03 DIAGNOSIS — I110 Hypertensive heart disease with heart failure: Secondary | ICD-10-CM | POA: Diagnosis not present

## 2015-09-03 DIAGNOSIS — I509 Heart failure, unspecified: Secondary | ICD-10-CM | POA: Insufficient documentation

## 2015-09-03 DIAGNOSIS — G4733 Obstructive sleep apnea (adult) (pediatric): Secondary | ICD-10-CM | POA: Insufficient documentation

## 2015-09-03 DIAGNOSIS — R0602 Shortness of breath: Secondary | ICD-10-CM | POA: Insufficient documentation

## 2015-09-03 LAB — ECHOCARDIOGRAM COMPLETE
FS: 10 % — AB (ref 28–44)
IV/PV OW: 1.04
LDCA: 3.8 cm2
LEFT ATRIUM END SYS DIAM: 50 cm
LV PW d: 10.2 mm — AB (ref 0.6–1.1)
LV SIMPSON'S DISK: 28
LVOTPV: 81.1 m/s
Stroke v: 55 ml
VTI: 14.5 cm

## 2015-09-03 MED ORDER — PERFLUTREN LIPID MICROSPHERE
1.0000 mL | INTRAVENOUS | Status: AC | PRN
  Administered 2015-09-03: 2 mL via INTRAVENOUS

## 2015-09-03 MED ORDER — AMLODIPINE BESYLATE 5 MG PO TABS: 5.0000 mg | ORAL_TABLET | Freq: Every day | ORAL | Status: DC

## 2015-09-03 MED FILL — AMLODIPINE BESYLATE 5 MG TA: 5 | 30 days supply | Qty: 30 | Fill #0

## 2015-09-03 NOTE — Progress Notes (Signed)
Mr. Laqueta JeanVice presented for an echocardiogram today. Looking at his echo from last year (07/29/14), his EF was normal at 60-65%. Today's echo showed an EF of 20-25%. DOD (Dr. Tenny Crawoss) was notified and has agreed to see him after my exam.   Dewitt HoesBilly Lolita Faulds, [facility] 09/03/2015

## 2015-09-03 NOTE — Progress Notes (Signed)
Cardiology Office Note   Date:  09/03/2015   ID:  Rickey Calderon, DOB July 05, 1965, MRN [id number]  PCP:  Lora Paula, MD  Cardiologist:   Dietrich Pates, MD   Presents on referral from ED for eval of SOB and CHF    History of Present Illness: Rickey Calderon is a 50 y.o. male with a history of CHF and HTN  He was seen in ER on 3/6 with SOB that started that day  Seen prevoiusly by PCP  XRay and BNP consistent with CHF  Lasix not filled.  Given lasix x 1 po in ER Set up for echo  He had this today  Has not had lasix filled  The patient denies ever having chest pain  Breathing is short.  Better than when first went to ER  Sleeps with 2 pillows  Gives out    Brought over from echo area for evaluation  Has appt with J Hochrein next week.  Outpatient Prescriptions Prior to Visit  Medication Sig Dispense Refill  . acetaminophen-codeine (TYLENOL #3) 300-30 MG tablet Take 1 tablet by mouth every 8 (eight) hours as needed for moderate pain. 90 tablet 0  . albuterol (PROVENTIL HFA;VENTOLIN HFA) 108 (90 Base) MCG/ACT inhaler Inhale 2 puffs into the lungs every 6 (six) hours as needed for wheezing or shortness of breath. 1 Inhaler 0  . allopurinol (ZYLOPRIM) 300 MG tablet TAKE 1 TABLET BY MOUTH DAILY 30 tablet 3  . cetirizine (ZYRTEC) 10 MG tablet Take 1 tablet (10 mg total) by mouth daily. 30 tablet 11  . cyclobenzaprine (FLEXERIL) 10 MG tablet Take 1 tablet (10 mg total) by mouth 3 (three) times daily as needed for muscle spasms. 30 tablet 1  . Fluticasone-Salmeterol (ADVAIR) 100-50 MCG/DOSE AEPB Inhale 1 puff into the lungs 2 (two) times daily. 60 each 2  . furosemide (LASIX) 40 MG tablet Take 1 tablet (40 mg total) by mouth 2 (two) times daily. 60 tablet 0  . gabapentin (NEURONTIN) 300 MG capsule Take 1 capsule (300 mg total) by mouth 3 (three) times daily. 90 capsule 3  . losartan (COZAAR) 100 MG tablet Take 1 tablet (100 mg total) by mouth daily. 30 tablet 5  . metoprolol tartrate  (LOPRESSOR) 50 MG tablet Take 1 tablet (50 mg total) by mouth 2 (two) times daily. 60 tablet 3  . Multiple Vitamins-Minerals (MULTIVITAMIN) tablet Take 1 tablet by mouth daily. (Patient not taking: Reported on 08/20/2015) 30 tablet 11  . potassium chloride SA (K-DUR,KLOR-CON) 20 MEQ tablet Take 2 tablets (40 mEq total) by mouth daily. 60 tablet 0  . sertraline (ZOLOFT) 100 MG tablet Take 1 tablet (100 mg total) by mouth daily. 30 tablet 2  . tamsulosin (FLOMAX) 0.4 MG CAPS capsule Take 1 capsule (0.4 mg total) by mouth daily after supper. 30 capsule 3  . XARELTO 20 MG TABS tablet TAKE 1 TABLET BY MOUTH DAILY WITH SUPPER. 30 tablet 3   No facility-administered medications prior to visit.     Allergies:   Review of patient's allergies indicates no known allergies.   Past Medical History  Diagnosis Date  . Hypertension   . Gout   . Prostatitis   . Bronchitis   . Depression   . Seasonal allergies   . Asthma   . CHF (congestive heart failure) [facility])     Past Surgical History  Procedure Laterality Date  . Abdominal aortagram N/A 07/28/2014    Procedure: ABDOMINAL Ronny Flurry;  Surgeon: Chuck Hint, MD;  Location: [facility] CATH LAB;  Service: Cardiovascular;  Laterality: N/A;  . Lower extremity angiogram Bilateral 07/28/2014    Procedure: LOWER EXTREMITY ANGIOGRAM;  Surgeon: Chuck Hinthristopher S Dickson, MD;  [facility];  Service: Cardiovascular;  Laterality: Bilateral;  . Thrombectomy femoral artery Right 08/01/2014    Procedure: THROMBECTOMY RIGHT LEG SFA, Anterior tibial, and perineal arteries with intraoperative arteriograms;  Surgeon: Nada LibmanVance W Brabham, MD;  [facility];  Service: Vascular;  Laterality: Right;     Social History:  The patient  reports that he has never smoked. He has never used smokeless tobacco. He reports that he does not drink alcohol or use illicit drugs.   Family History:  The patient's family history includes Diabetes in his father; Hyperlipidemia in his  father.    ROS:  Please see the history of present illness. All other systems are reviewed and  Negative to the above problem except as noted.    PHYSICAL EXAM: VS:  BP 142/114 mmHg  Pulse 84  Resp 18  GEN:  Morbidly obese 50 yo in NAD in no acute distress HEENT: normal Neck: Neck is full but JVP appears increased  No carotid bruits, or masses Cardiac: RRR; no murmurs, rubs, or gallops, 1+ edema  Respiratory:  clear to auscultation bilaterally, normal work of breathing GI: soft, obese  No definite masses  Mild RUQ tenderness MS: no deformity Moving all extremities   Skin: warm and dry, no rash Neuro:  Strength and sensation are intact Psych: euthymic mood, full affect   EKG:  EKG is ordered today.  SR 65  First degree AV block  PR 208 msec.  RAA  LAA.  Pulm dz pattern.  Incomp RBBB.  T wave inversion laterally    Lipid Panel    Component Value Date/Time   CHOL 169 03/31/2015 1148   TRIG 88 03/31/2015 1148   HDL 54 03/31/2015 1148   CHOLHDL 3.1 03/31/2015 1148   VLDL 18 03/31/2015 1148   LDLCALC 97 03/31/2015 1148      Wt Readings from Last 3 Encounters:  08/20/15 368 lb (166.924 kg)  07/10/15 374 lb (169.645 kg)  07/10/15 380 lb (172.367 kg)      ASSESSMENT AND PLAN:  1 Acute systolic CHF  Pt had echo today  LVEF is depressed at 25 to 30%  Diffuse hypkinesis.  Gr II diastolic dsyfunction  RVEF is also reported reduced  Biatrial enlargement  Increased L atrial pressures  Exam is difficult with size but he appears to be volume overloaded  Needs to fill Rx for Lasix    Etiol not clear.  For now need to optimize volume ? HTN  ? Idiopathic  ? CAD (Pt without CP)  ?other  Would diurese for now before further work up  2.  HTN  BP remains severely increased  Start diuresis  Add amlodipine for now to regimen as well  I dont think going up on b blocker will add much and need BP better controlled to expedite diuresis   Transition then to b blocker   Will make sure that  pt has f/u soon to reeval, adjust meds      Signed, Dietrich PatesPaula Ross, MD  09/03/2015 8:55 PM    [facility] [address], [place], [place]  [address] Phone: [phone]; Fax: [phone]

## 2015-09-03 NOTE — Patient Instructions (Signed)
Your physician has recommended you make the following change in your medication:  1.) START NORVASC (amlodipine) 5 mg once a day (for blood pressure)  Your physician recommends that you KEEP YOUR follow-up appointment WITH DR. HOCHREIN

## 2015-09-09 NOTE — Telephone Encounter (Signed)
Pt. Called to speak with PCP because his wife is concerned of him sleeping all the time. Please f/u with pt.

## 2015-09-10 NOTE — Progress Notes (Signed)
Cardiology Office Note   Date:  09/11/2015   ID:  Rickey Calderon, DOB 1966-02-07, MRN [id number]  PCP:  Lora Paula, MD  Cardiologist:   Rollene Rotunda, MD   Chief Complaint  Patient presents with  . Cardiomyopathy      History of Present Illness: Rickey Calderon is a 50 y.o. male who presents for evaluation of cardiomyopathy. He's had dyspnea for some time. He became more short of breath with less activity earlier this year and actually went to the emergency room. His primary provider had ordered an echocardiogram. During his ER visit on 36 he was found to have chest x-ray consistent with heart failure and elevated BNP. Couple of days later he came to our office for an echocardiogram was found to have an ejection fraction of 25-30%. He had not filled her Lasix prescription from the emergency room and he has since done. He was seen by Dr. Tenny Craw and is now set up to follow with me as he had previously had a new patient appointment with me. It looks like she's lost about 6 pounds since he started his diuretic. He is breathing a little bit better. He does not describe PND or orthopnea. He says he has some mild edema. He gets dyspneic with mild exertion but it doesn't sound like he particularly exerts himself. He does not have chest discomfort, neck or arm discomfort. He doesn't feel palpitations, presyncope or syncope.  Past Medical History  Diagnosis Date  . Hypertension   . Gout   . Prostatitis   . Bronchitis   . Depression   . Seasonal allergies   . Asthma   . CHF (congestive heart failure) (HCC)     EF 30% by echo  . Morbid obesity (HCC)   . 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 Abdominal aortagram N/A 07/28/2014    Procedure: ABDOMINAL Ronny Flurry;  Surgeon: Chuck Hint, MD;  Location: [facility] CATH LAB;  Service: Cardiovascular;  Laterality: N/A;  . Lower extremity angiogram Bilateral 07/28/2014    Procedure: LOWER EXTREMITY  ANGIOGRAM;  Surgeon: Chuck Hint, MD;  Location: [facility] CATH LAB;  Service: Cardiovascular;  Laterality: Bilateral;  . Thrombectomy femoral artery Right 08/01/2014    Procedure: THROMBECTOMY RIGHT LEG SFA, Anterior tibial, and perineal arteries with intraoperative arteriograms;  Surgeon: Nada Libman, MD;  Location: [facility] OR;  Service: Vascular;  Laterality: Right;     Current Outpatient Prescriptions  Medication Sig Dispense Refill  . acetaminophen-codeine (TYLENOL #3) 300-30 MG tablet Take 1 tablet by mouth every 8 (eight) hours as needed for moderate pain. 90 tablet 0  . albuterol (PROVENTIL HFA;VENTOLIN HFA) 108 (90 Base) MCG/ACT inhaler Inhale 2 puffs into the lungs every 6 (six) hours as needed for wheezing or shortness of breath. 1 Inhaler 0  . allopurinol (ZYLOPRIM) 300 MG tablet TAKE 1 TABLET BY MOUTH DAILY 30 tablet 3  . amLODipine (NORVASC) 5 MG tablet Take 1 tablet (5 mg total) by mouth daily. 90 tablet 3  . cetirizine (ZYRTEC) 10 MG tablet Take 1 tablet (10 mg total) by mouth daily. 30 tablet 11  . cyclobenzaprine (FLEXERIL) 10 MG tablet Take 1 tablet (10 mg total) by mouth 3 (three) times daily as needed for muscle spasms. 30 tablet 1  . Fluticasone-Salmeterol (ADVAIR) 100-50 MCG/DOSE AEPB Inhale 1 puff into the lungs 2 (two) times daily. 60 each 2  . furosemide (LASIX) 40 MG tablet Take 1  tablet (40 mg total) by mouth 2 (two) times daily. 60 tablet 0  . gabapentin (NEURONTIN) 300 MG capsule Take 1 capsule (300 mg total) by mouth 3 (three) times daily. 90 capsule 3  . losartan (COZAAR) 100 MG tablet Take 1 tablet (100 mg total) by mouth daily. 30 tablet 5  . metoprolol tartrate (LOPRESSOR) 50 MG tablet Take 1 tablet (50 mg total) by mouth 2 (two) times daily. 60 tablet 3  . Multiple Vitamins-Minerals (MULTIVITAMIN) tablet Take 1 tablet by mouth daily. 30 tablet 11  . potassium chloride SA (K-DUR,KLOR-CON) 20 MEQ tablet Take 2 tablets (40 mEq total) by mouth daily. 60 tablet 0    . sertraline (ZOLOFT) 100 MG tablet Take 1 tablet (100 mg total) by mouth daily. 30 tablet 2  . tamsulosin (FLOMAX) 0.4 MG CAPS capsule Take 1 capsule (0.4 mg total) by mouth daily after supper. 30 capsule 3  . XARELTO 20 MG TABS tablet TAKE 1 TABLET BY MOUTH DAILY WITH SUPPER. 30 tablet 3  . isosorbide-hydrALAZINE (BIDIL) 20-37.5 MG tablet Take 1 tablet by mouth 3 (three) times daily. 90 tablet 6   No current facility-administered medications for this visit.    Allergies:   Review of patient's allergies indicates no known allergies.    ROS:  Please see the history of present illness.   Otherwise, review of systems are positive for none.   All other systems are reviewed and negative.    PHYSICAL EXAM: VS:  BP 150/120 mmHg  Pulse 76  Ht  (1.702 m)  Wt 360 lb 11.2 oz (163.612 kg)  BMI 56.48 kg/m2 , BMI Body mass index is 56.48 kg/(m^2). GENERAL:  Well appearing HEENT:  Pupils equal round and reactive, fundi not visualized, oral mucosa unremarkable NECK:  No jugular venous distention, waveform within normal limits, carotid upstroke brisk and symmetric, no bruits, no thyromegaly LYMPHATICS:  No cervical, inguinal adenopathy LUNGS:  Clear to auscultation bilaterally BACK:  No CVA tenderness CHEST:  Unremarkable HEART:  PMI not displaced or sustained,S1 and S2 within normal limits, no S3, no S4, no clicks, no rubs, no murmurs ABD:  Flat, positive bowel sounds normal in frequency in pitch, no bruits, no rebound, no guarding, no midline pulsatile mass, no hepatomegaly, no splenomegaly, obese EXT:  2 plus pulses throughout, mild edema, no cyanosis no clubbing SKIN:  No rashes no nodules NEURO:  Cranial nerves II through XII grossly intact, motor grossly intact throughout PSYCH:  Cognitively intact, oriented to person place and time    EKG:  EKG is not ordered today.   Recent Labs: 03/31/2015: ALT 17; TSH 1.481 08/20/2015: Pro B Natriuretic peptide (BNP) 3271.00* 08/31/2015: B  Natriuretic Peptide 1024.5*; BUN 28*; Creatinine, Ser 1.81*; Hemoglobin 12.4*; Platelets 250; Potassium 3.4*; Sodium 140    Lipid Panel    Component Value Date/Time   CHOL 169 03/31/2015 1148   TRIG 88 03/31/2015 1148   HDL 54 03/31/2015 1148   CHOLHDL 3.1 03/31/2015 1148   VLDL 18 03/31/2015 1148   LDLCALC 97 03/31/2015 1148      Wt Readings from Last 3 Encounters:  09/11/15 360 lb 11.2 oz (163.612 kg)  08/20/15 368 lb (166.924 kg)  07/10/15 374 lb (169.645 kg)      Other studies Reviewed: Additional studies/ records that were reviewed today include: ED records.  Primary care records.  Echo. Review of the above records demonstrates:  Please see elsewhere in the note.     ASSESSMENT AND PLAN:  CARDIOMYOPATHY:  I suspect this is secondary to hypertension we will eventually need an ischemia workup once his meds a been titrated and his blood pressure somewhat better.   I had a long discussion with the patient about the severity of this illness and need for medical management. We talked about salt and fluid restriction. We talked about compliance with medications.   HTN:  He says he is taking his medications. I'm going to add BiDil 13 times a day. I am going to check a basic metabolic profile today since he just started the Lasix. I will slowly titrate meds going forward.  SLEEP APNEA:  He understands and need to get his CPAP machine fixed.  OBESITY:  We began to talk about diet and the need for weight loss as part of this therapy.  ARTERIAL THROMBOSIS:  Per Dr. Estanislado SpireBrabham's note in April of last year he was supposed to get follow-up arterial Dopplers and ABIs. I will order these. Pending these results I will most likely stopped his Xarelto and check a hypercoagulable workup.   (Greater than 40 minutes reviewing all data with greater than 50% face to face with the patient).  Current medicines are reviewed at length with the patient today.  The patient does not have concerns  regarding medicines.  The following changes have been made:  As above  Labs/ tests ordered today include:   Orders Placed This Encounter  Procedures  . Basic Metabolic Panel (BMET)     Disposition:   FU with our clinic in a couple of weeks for med titration.     Signed, Rollene RotundaJames Lamont Glasscock, MD  09/11/2015 12:58 PM    [facility]

## 2015-09-11 ENCOUNTER — Ambulatory Visit (INDEPENDENT_AMBULATORY_CARE_PROVIDER_SITE_OTHER): Payer: Medicaid Other | Admitting: Cardiology

## 2015-09-11 ENCOUNTER — Encounter: Payer: Self-pay | Admitting: Cardiology

## 2015-09-11 VITALS — BP 150/120 | HR 76 | Ht 67.0 in | Wt 360.7 lb

## 2015-09-11 DIAGNOSIS — Z79899 Other long term (current) drug therapy: Secondary | ICD-10-CM | POA: Diagnosis not present

## 2015-09-11 DIAGNOSIS — I743 Embolism and thrombosis of arteries of the lower extremities: Secondary | ICD-10-CM | POA: Diagnosis not present

## 2015-09-11 MED ORDER — ISOSORB DINITRATE-HYDRALAZINE 20-37.5 MG PO TABS: 1.0000 | ORAL_TABLET | Freq: Three times a day (TID) | ORAL | Status: DC

## 2015-09-11 MED FILL — BIDIL TABLET: 20-37.5 | 30 days supply | Qty: 90 | Fill #0

## 2015-09-11 NOTE — Patient Instructions (Addendum)
Your physician recommends that you schedule a follow-up appointment in: 2 Weeks with PA  Your physician has requested that you have an ankle brachial index (ABI). During this test an ultrasound and blood pressure cuff are used to evaluate the arteries that supply the arms and legs with blood. Allow thirty minutes for this exam. There are no restrictions or special instructions.  Your physician has requested that you have a lower or upper extremity arterial duplex. This test is an ultrasound of the arteries in the legs or arms. It looks at arterial blood flow in the legs and arms. Allow one hour for Lower and Upper Arterial scans. There are no restrictions or special instructions  Your physician has recommended you make the following change in your medication: START Bidil 20/37.5 mg times a day  Your physician [facility] that [facility]

## 2015-09-12 LAB — BASIC METABOLIC PANEL
BUN: 25 mg/dL (ref 7–25)
CO2: 28 mmol/L (ref 20–31)
CREATININE: 1.36 mg/dL — AB (ref 0.70–1.33)
Calcium: 8.9 mg/dL (ref 8.6–10.3)
Chloride: 100 mmol/L (ref 98–110)
Glucose, Bld: 92 mg/dL (ref 65–99)
POTASSIUM: 4.5 mmol/L (ref 3.5–5.3)
Sodium: 139 mmol/L (ref 135–146)

## 2015-09-15 NOTE — Telephone Encounter (Signed)
LVM to return call  PLEASE SCHEDULE APPOINTMENT WITH PCP

## 2015-09-18 ENCOUNTER — Inpatient Hospital Stay (HOSPITAL_COMMUNITY): Admission: RE | Admit: 2015-09-18 | Payer: Medicaid Other | Source: Ambulatory Visit

## 2015-09-18 ENCOUNTER — Ambulatory Visit (HOSPITAL_COMMUNITY)
Admission: RE | Admit: 2015-09-18 | Discharge: 2015-09-18 | Disposition: A | Discharge status: Home / Self Care | Payer: Medicaid Other | Source: Ambulatory Visit | Attending: Cardiology | Admitting: Cardiology

## 2015-09-18 ENCOUNTER — Other Ambulatory Visit: Payer: Self-pay | Admitting: Cardiology

## 2015-09-18 DIAGNOSIS — I110 Hypertensive heart disease with heart failure: Secondary | ICD-10-CM | POA: Insufficient documentation

## 2015-09-18 DIAGNOSIS — F329 Major depressive disorder, single episode, unspecified: Secondary | ICD-10-CM | POA: Insufficient documentation

## 2015-09-18 DIAGNOSIS — Z9889 Other specified postprocedural states: Secondary | ICD-10-CM | POA: Diagnosis present

## 2015-09-18 DIAGNOSIS — I743 Embolism and thrombosis of arteries of the lower extremities: Secondary | ICD-10-CM

## 2015-09-18 DIAGNOSIS — I509 Heart failure, unspecified: Secondary | ICD-10-CM | POA: Diagnosis not present

## 2015-09-18 DIAGNOSIS — Z6841 Body Mass Index (BMI) 40.0 and over, adult: Secondary | ICD-10-CM | POA: Diagnosis not present

## 2015-09-18 MED FILL — SERTRALINE HCL 100 MG TAB: 100 | 30 days supply | Qty: 30 | Fill #1

## 2015-09-18 MED FILL — METOPROLOL TARTRATE 50 MG T: 50 | 30 days supply | Qty: 60 | Fill #1

## 2015-09-18 MED FILL — LOSARTAN POTASSIUM 100 MG T: 100 | 30 days supply | Qty: 30 | Fill #1

## 2015-09-21 ENCOUNTER — Ambulatory Visit: Payer: Medicaid Other | Attending: Family Medicine | Admitting: Family Medicine

## 2015-09-21 ENCOUNTER — Encounter: Payer: Self-pay | Admitting: Family Medicine

## 2015-09-21 ENCOUNTER — Encounter: Payer: Self-pay | Admitting: Clinical

## 2015-09-21 VITALS — BP 149/88 | HR 71 | Temp 97.4°F | Resp 20 | Ht 66.0 in | Wt 365.0 lb

## 2015-09-21 DIAGNOSIS — G4733 Obstructive sleep apnea (adult) (pediatric): Secondary | ICD-10-CM

## 2015-09-21 DIAGNOSIS — I5041 Acute combined systolic (congestive) and diastolic (congestive) heart failure: Secondary | ICD-10-CM | POA: Diagnosis not present

## 2015-09-21 DIAGNOSIS — M542 Cervicalgia: Secondary | ICD-10-CM | POA: Diagnosis not present

## 2015-09-21 DIAGNOSIS — G8929 Other chronic pain: Secondary | ICD-10-CM | POA: Insufficient documentation

## 2015-09-21 DIAGNOSIS — Z9989 Dependence on other enabling machines and devices: Secondary | ICD-10-CM

## 2015-09-21 NOTE — Progress Notes (Signed)
[facility] 09/21/2015 08/20/2015 07/10/2015 04/30/2015 03/31/2015  Decreased Interest 0 0 0 0 0  Down, Depressed, Hopeless 0 1 1 1  0  PHQ - 2 Score 0 1 1 1  0  Altered sleeping 1 1 1  - -  Tired, decreased energy 0 1 1 - -  Change in appetite 0 1 1 - -  Feeling bad or failure about yourself  1 1 1  - -  Trouble concentrating 0 0 0 - -  Moving slowly or fidgety/restless 0 0 0 - -  Suicidal thoughts 0 0 0 - -  PHQ-9 Score 2 5 5  - -    GAD 7 : Generalized Anxiety Score 08/20/2015 07/10/2015 04/30/2015  Nervous, Anxious, on Edge 0 0 0  Control/stop worrying 0 0 1  Worry too much - different things 0 1 1  Trouble relaxing 0 0 0  Restless 0 0 0  Easily annoyed or irritable 1 1 1   Afraid - awful might happen 0 0 0  Total GAD 7 Score 1 2 3

## 2015-09-21 NOTE — Assessment & Plan Note (Signed)
A: recent onset of mixed CHF in setting of HTN, morbid obesity and untreated OSA. Patient compliant with meds and is now less symptomatic P: Continue current regimen Continue low salt diet, weight loss Rx for CPAP tubing written

## 2015-09-21 NOTE — Assessment & Plan Note (Signed)
A: MSK neck pain P: PT Ice Heat No NSAID while taking xarelto

## 2015-09-21 NOTE — Assessment & Plan Note (Signed)
A: untreated OSA with daytime somnolence P: Rx for new CPAP tubing provided

## 2015-09-21 NOTE — Progress Notes (Addendum)
Paitent is here for FU SOB  Patient denies SOB at this time.  Patient has taken medications and patient has not eaten today.

## 2015-09-21 NOTE — Patient Instructions (Addendum)
Rickey Calderon was seen today for follow-up.  Diagnoses and all orders for this visit:  Chronic neck pain -     Ambulatory referral to Physical Therapy  OSA on CPAP  Acute combined systolic and diastolic CHF, NYHA class 2 (HCC)  Morbid obesity, unspecified obesity type (HCC)  Rx for CPAP tubing written  Continue current med regimen  F.u in 8 weeks  Dr. Armen PickupFunches

## 2015-09-21 NOTE — Progress Notes (Signed)
Subjective:  Patient ID: Rickey MazeLennard Blumenstein, male    DOB: 29-Aug-1965  Age: 50 y.o. MRN: [id number]  CC: Follow-up   HPI Rickey Calderon presents for   1. CHF: diagnosed one month with elevated pro-BNP and cardiomegaly on CXR. Has mixed CHF. Has established with cardiology. Denies CP and SOB. Admits to somnolence. Not using CPAP as he needs new tubing. Compliant with all meds. Compliant with low salt diet.   2. Neck pain: x one year or more. Pain with stiffness. B/l neck. Worse when he cracks his neck. No swelling. No recent trauma.   3. OSA: not using CPAP as he needs new tubing. He admits to daytime somnolence. No CP or SOB.   Social History  Substance Use Topics  . Smoking status: Never Smoker   . Smokeless tobacco: Never Used  . Alcohol Use: No    Outpatient Prescriptions Prior to Visit  Medication Sig Dispense Refill  . acetaminophen-codeine (TYLENOL #3) 300-30 MG tablet Take 1 tablet by mouth every 8 (eight) hours as needed for moderate pain. 90 tablet 0  . albuterol (PROVENTIL HFA;VENTOLIN HFA) 108 (90 Base) MCG/ACT inhaler Inhale 2 puffs into the lungs every 6 (six) hours as needed for wheezing or shortness of breath. 1 Inhaler 0  . allopurinol (ZYLOPRIM) 300 MG tablet TAKE 1 TABLET BY MOUTH DAILY 30 tablet 3  . amLODipine (NORVASC) 5 MG tablet Take 1 tablet (5 mg total) by mouth daily. 90 tablet 3  . cetirizine (ZYRTEC) 10 MG tablet Take 1 tablet (10 mg total) by mouth daily. 30 tablet 11  . cyclobenzaprine (FLEXERIL) 10 MG tablet Take 1 tablet (10 mg total) by mouth 3 (three) times daily as needed for muscle spasms. 30 tablet 1  . Fluticasone-Salmeterol (ADVAIR) 100-50 MCG/DOSE AEPB Inhale 1 puff into the lungs 2 (two) times daily. 60 each 2  . furosemide (LASIX) 40 MG tablet Take 1 tablet (40 mg total) by mouth 2 (two) times daily. 60 tablet 0  . gabapentin (NEURONTIN) 300 MG capsule Take 1 capsule (300 mg total) by mouth 3 (three) times daily. 90 capsule 3  .  isosorbide-hydrALAZINE (BIDIL) 20-37.5 MG tablet Take 1 tablet by mouth 3 (three) times daily. 90 tablet 6  . losartan (COZAAR) 100 MG tablet Take 1 tablet (100 mg total) by mouth daily. 30 tablet 5  . metoprolol tartrate (LOPRESSOR) 50 MG tablet Take 1 tablet (50 mg total) by mouth 2 (two) times daily. 60 tablet 3  . potassium chloride SA (K-DUR,KLOR-CON) 20 MEQ tablet Take 2 tablets (40 mEq total) by mouth daily. 60 tablet 0  . sertraline (ZOLOFT) 100 MG tablet Take 1 tablet (100 mg total) by mouth daily. 30 tablet 2  . tamsulosin (FLOMAX) 0.4 MG CAPS capsule Take 1 capsule (0.4 mg total) by mouth daily after supper. 30 capsule 3  . XARELTO 20 MG TABS tablet TAKE 1 TABLET BY MOUTH DAILY WITH SUPPER. 30 tablet 3  . Multiple Vitamins-Minerals (MULTIVITAMIN) tablet Take 1 tablet by mouth daily. (Patient not taking: Reported on 09/21/2015) 30 tablet 11   No facility-administered medications prior to visit.    ROS Review of Systems  Constitutional: Positive for fatigue. Negative for fever, chills and unexpected weight change.  HENT: Negative for ear discharge and ear pain.   Eyes: Negative for visual disturbance.  Respiratory: Negative for cough and shortness of breath.   Cardiovascular: Negative for chest pain, palpitations and leg swelling.  Gastrointestinal: Negative for nausea, vomiting, abdominal pain, diarrhea, constipation and  blood in stool.  Endocrine: Negative for polydipsia, polyphagia and polyuria.  Musculoskeletal: Positive for back pain, neck pain and neck stiffness. Negative for myalgias, arthralgias and gait problem.  Skin: Negative for rash.  Allergic/Immunologic: Negative for immunocompromised state.  Neurological: Negative for dizziness and headaches.  Hematological: Negative for adenopathy. Does not bruise/bleed easily.  Psychiatric/Behavioral: Positive for sleep disturbance and dysphoric mood. Negative for suicidal ideas. The patient is not nervous/anxious.      Objective:  BP 149/88 mmHg  Pulse 71  Temp(Src) 97.4 F (36.3 C) (Oral)  Resp 20  Ht  (1.676 m)  Wt 365 lb (165.563 kg)  BMI 58.94 kg/m2  SpO2 95%  BP/Weight 09/21/2015 09/11/2015 09/03/2015  Systolic BP 149 150 142  Diastolic BP 88 120 114  Wt. (Lbs) 365 360.7 -  BMI 58.94 56.48 -    Physical Exam  Constitutional: He appears well-developed and well-nourished. No distress.  Morbid obesity   HENT:  Head: Normocephalic and atraumatic.  Right Ear: Tympanic membrane, external ear and ear canal normal.  Left Ear: External ear and ear canal normal.  Wax in L ear   Neck: Normal range of motion and full passive range of motion without pain. Neck supple. Muscular tenderness present. No spinous process tenderness present. No rigidity. No edema, no erythema and normal range of motion present.  Cardiovascular: Normal rate, regular rhythm, normal heart sounds and intact distal pulses.   Pulmonary/Chest: Effort normal.  Musculoskeletal: He exhibits edema. Tenderness: 1+ b/l LE   Neurological: He is alert.  Skin: Skin is warm and dry. No rash noted. No erythema.  Psychiatric: He has a normal mood and affect.     Assessment & Plan:  Koi was seen today for follow-up.  Diagnoses and all orders for this visit:  Chronic neck pain -     Ambulatory referral to Physical Therapy  OSA on CPAP  Acute combined systolic and diastolic CHF, NYHA class 2 (HCC)    No orders of the defined types were placed in this encounter.    Follow-up: No Follow-up on file.   Dessa Phi MD

## 2015-09-23 ENCOUNTER — Telehealth: Payer: Self-pay | Admitting: Cardiology

## 2015-09-23 DIAGNOSIS — D689 Coagulation defect, unspecified: Secondary | ICD-10-CM

## 2015-09-23 NOTE — Telephone Encounter (Signed)
Spoke with pt about his doppler and for him to stop his Xarelto, pt have an appt next week on 04/04 to see Rickey Calderon, pt was wandering if he can get the blood work drawn on that day because he take the assisted bus to appt and they won't be able to come out twice.  Lab work order for pt to get done.

## 2015-09-23 NOTE — Telephone Encounter (Signed)
Forward to nya 

## 2015-09-23 NOTE — Telephone Encounter (Signed)
-----   Message from Rollene RotundaJames Hochrein, MD sent at 09/20/2015  6:51 PM EDT ----- No evidence of residual arterial thrombosis.    Stop Xarelto and please draw a hypercoaguable panel.

## 2015-09-23 NOTE — Telephone Encounter (Signed)
New message   Pt is calling for results 09-18-15

## 2015-09-28 ENCOUNTER — Ambulatory Visit: Payer: Medicaid Other | Admitting: Physician Assistant

## 2015-09-28 MED FILL — ?ALLOPURINOL 300 MG TABLETS: 300 | 30 days supply | Qty: 30 | Fill #2

## 2015-09-28 MED FILL — AMLODIPINE BESYLATE 5 MG TA: 5 | 30 days supply | Qty: 30 | Fill #1

## 2015-09-29 ENCOUNTER — Ambulatory Visit (INDEPENDENT_AMBULATORY_CARE_PROVIDER_SITE_OTHER): Payer: Self-pay | Admitting: Physician Assistant

## 2015-09-29 ENCOUNTER — Encounter: Payer: Self-pay | Admitting: Physician Assistant

## 2015-09-29 VITALS — BP 140/96 | HR 64 | Ht 66.0 in | Wt 360.0 lb

## 2015-09-29 DIAGNOSIS — G4733 Obstructive sleep apnea (adult) (pediatric): Secondary | ICD-10-CM

## 2015-09-29 DIAGNOSIS — I743 Embolism and thrombosis of arteries of the lower extremities: Secondary | ICD-10-CM

## 2015-09-29 DIAGNOSIS — I5042 Chronic combined systolic (congestive) and diastolic (congestive) heart failure: Secondary | ICD-10-CM

## 2015-09-29 DIAGNOSIS — Z9989 Dependence on other enabling machines and devices: Secondary | ICD-10-CM

## 2015-09-29 DIAGNOSIS — I10 Essential (primary) hypertension: Secondary | ICD-10-CM

## 2015-09-29 MED ORDER — AMLODIPINE BESYLATE 10 MG PO TABS: 10.0000 mg | ORAL_TABLET | Freq: Every day | ORAL | Status: DC

## 2015-09-29 MED FILL — TAMSULOSIN HCL 0.4 MG CAP: 0.4 | 30 days supply | Qty: 30 | Fill #3

## 2015-09-29 MED FILL — ?CETIRIZINE HCL 10 MG TABLE: 10 | 30 days supply | Qty: 30 | Fill #10

## 2015-09-29 MED FILL — GABAPENTIN 300 MG CAPSULE: 300 | 30 days supply | Qty: 90 | Fill #2

## 2015-09-29 NOTE — Patient Instructions (Signed)
Medication Instructions:  Your physician has recommended you make the following change in your medication: INCREASE Amlodipine to 10mg  daily   Labwork: Hypocoagulable panel today  Testing/Procedures: None ordered  Follow-Up: Your physician recommends that you schedule a follow-up appointment in: 3 months with Dr.Hochrein   Any Other Special Instructions Will Be Listed Below (If Applicable). Your physician recommends that you weigh, daily, at the same time every day, and in the same amount of clothing. Please record your daily weights. Call the office if your weight is up 3lbs in 24 hours or 5lbs in 5-7 days.       If you need a refill on your cardiac medications before your next appointment, please call your pharmacy.

## 2015-09-29 NOTE — Progress Notes (Signed)
Patient ID: Rickey Calderon, male   DOB: [birth year], 50 y.o.   MRN: [id number]    Date:  09/29/2015   ID:  Rickey Calderon, DOB 07-Jan-1966, MRN [id number]  PCP:  Rickey Paula, MD  Primary Cardiologist:  [facility] [place]  Chief Complaint  Patient presents with  . Follow-up    some shortness of breath, stabbing pain in LLQ 2 days ago. Some headaches     History of Present Illness: Rickey Calderon is a 50 y.o. male  who's had dyspnea for some time. He became more short of breath with less activity earlier this year and actually went to the emergency room. His primary provider had ordered an echocardiogram. During his ER visit on 3/6 he was found to have chest x-ray consistent with heart failure and elevated BNP. Couple of days later he came to our office for an echocardiogram was found to have an ejection fraction of 25-30%. He had not filled her Lasix prescription from the emergency room and he has since done. He saw Dr. Antoine Poche on March 17 and had lost about 6 pounds since he started his diuretic. He had follow-up arterial Dopplers and ABIs to reassess arterial thrombus from last year. This was negative and Xarelto. He is supposed to get hypercoagulable labs drawn today.    Patient presents for follow-up was cardiomyopathy. He reports having a good weekend. Didn't do much but just relaxed. He said some periodic shortness of breath when sitting.  Is really describe very much shortness of breath with activity however, I don't think is very active.  He still has not gotten his CPAP fixed.  He denies orthopnea or PND.  Xarelto was recently stopped after having follow-up lower extremity arterial Dopplers.   The patient currently denies nausea, vomiting, fever, chest pain, dizziness, cough, congestion, abdominal pain, hematochezia, melena, claudication.  Wt Readings from Last 3 Encounters:  09/29/15 360 lb (163.295 kg)  09/21/15 365 lb (165.563 kg)  09/11/15 360 lb 11.2 oz (163.612 kg)     Past Medical  History  Diagnosis Date  . Hypertension   . Gout   . Prostatitis   . Bronchitis   . Depression   . Seasonal allergies   . Asthma   . CHF (congestive heart failure) (HCC)     EF 30% by echo  . Morbid obesity (HCC)   . Femoral thrombosis (HCC)     Current Outpatient Prescriptions  Medication Sig Dispense Refill  . acetaminophen-codeine (TYLENOL #3) 300-30 MG tablet Take 1 tablet by mouth every 8 (eight) hours as needed for moderate pain. 90 tablet 0  . albuterol (PROVENTIL HFA;VENTOLIN HFA) 108 (90 Base) MCG/ACT inhaler Inhale 2 puffs into the lungs every 6 (six) hours as needed for wheezing or shortness of breath. 1 Inhaler 0  . allopurinol (ZYLOPRIM) 300 MG tablet TAKE 1 TABLET BY MOUTH DAILY 30 tablet 3  . amLODipine (NORVASC) 10 MG tablet Take 1 tablet (10 mg total) by mouth daily. 30 tablet 5  . cetirizine (ZYRTEC) 10 MG tablet Take 1 tablet (10 mg total) by mouth daily. 30 tablet 11  . cyclobenzaprine (FLEXERIL) 10 MG tablet Take 1 tablet (10 mg total) by mouth 3 (three) times daily as needed for muscle spasms. 30 tablet 1  . Fluticasone-Salmeterol (ADVAIR) 100-50 MCG/DOSE AEPB Inhale 1 puff into the lungs 2 (two) times daily. 60 each 2  . furosemide (LASIX) 40 MG tablet Take 1 tablet (40 mg total) by mouth 2 (two) times daily. 60 tablet 0  .  gabapentin (NEURONTIN) 300 MG capsule Take 1 capsule (300 mg total) by mouth 3 (three) times daily. 90 capsule 3  . isosorbide-hydrALAZINE (BIDIL) 20-37.5 MG tablet Take 1 tablet by mouth 3 (three) times daily. 90 tablet 6  . losartan (COZAAR) 100 MG tablet Take 1 tablet (100 mg total) by mouth daily. 30 tablet 5  . metoprolol tartrate (LOPRESSOR) 50 MG tablet Take 1 tablet (50 mg total) by mouth 2 (two) times daily. 60 tablet 3  . potassium chloride SA (K-DUR,KLOR-CON) 20 MEQ tablet Take 2 tablets (40 mEq total) by mouth daily. 60 tablet 0  . sertraline (ZOLOFT) 100 MG tablet Take 1 tablet (100 mg total) by mouth daily. 30 tablet 2  .  tamsulosin (FLOMAX) 0.4 MG CAPS capsule Take 1 capsule (0.4 mg total) by mouth daily after supper. 30 capsule 3   No current facility-administered medications for this visit.    Allergies:   No Known Allergies  Social History:  The patient  reports that he has never smoked. He has never used smokeless tobacco. He reports that he does not drink alcohol or use illicit drugs.   Family history:   Family History  Problem Relation Age of Onset  . Diabetes Father   . Hyperlipidemia Father     ROS:  Please see the history of present illness.  All other systems reviewed and negative.   PHYSICAL EXAM: VS:  BP 140/96 mmHg  Pulse 64  Ht 5\' 6"  (1.676 m)  Wt 360 lb (163.295 kg)  BMI 58.13 kg/m2 Morbidly obese, well developed, in no acute distress HEENT: Pupils are equal round react to light accommodation extraocular movements are intact.  Neck: no JVDNo cervical lymphadenopathy. Cardiac: Regular rate and rhythm without murmurs rubs or gallops. Lungs:  clear to auscultation bilaterally, no wheezing, rhonchi or rales Abd: soft, nontender, positive bowel sounds all quadrants,  Ext: 1+ lower extremity edema.  2+ radial and dorsalis pedis pulses. 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 I increased his amlodipine to 10 mg daily. He is also on Cozaar 100 mg daily, BiDil, Lopressor 50 mg, Lasix 40 mg twice daily. Conclusion potentially increase his Lopressor or even switching  to Toprol since he has a cardiomyopathy.  He does have about 1+ lower extremity edema however, no other signs of heart failure.  Weight is stable from last visit.I'll continue current dose of Lasix.  We discussed getting his CPAP machine fixed and what happens if he does not.  We also discussed eating a low carb diet. He says his portions get out of control sometimes. I asked him to start exercising at least twice a day by going for short walks and then increasing the distance. Hypercoagulable panel will be drawn today.

## 2015-10-03 LAB — RFX DRVVT SCR W/RFLX CONF 1:1 MIX: DRVVT SCREEN: 34 s (ref <=45)

## 2015-10-03 LAB — RFX PTT-LA W/RFX TO HEX PHASE CONF: PTT-LA Screen: 35 s (ref <=40)

## 2015-10-06 LAB — HYPERCOAGULABLE PANEL, COMPREHENSIVE
ANTITHROMB III FUNC: 92 %{activity} (ref 80–120)
Anticardiolipin IgA: <11 [APL'U]
Anticardiolipin IgG: <14 [GPL'U]
Beta-2-Glycoprotein I IgM: 9 SMU (ref <=20)
PROTEIN C ACTIVITY: 108 % (ref 70–180)
PROTEIN S ANTIGEN, TOTAL: 113 % (ref 70–140)
Protein C Antigen: 105 % (ref 70–140)
Protein S Activity: 85 % (ref 70–150)

## 2015-10-12 ENCOUNTER — Telehealth: Payer: Self-pay | Admitting: Cardiology

## 2015-10-12 NOTE — Telephone Encounter (Signed)
Pt calling re lab results pt  [phone]

## 2015-10-12 NOTE — Telephone Encounter (Signed)
Spoke to patient  Aware [name] HAS NOT REVIEWED LAB RESULTS AND WILL BE IN THE OFFICE IN 2 WEEKS. PATIENT AWARE .

## 2015-10-14 ENCOUNTER — Telehealth: Payer: Self-pay | Admitting: Family Medicine

## 2015-10-14 DIAGNOSIS — G8929 Other chronic pain: Secondary | ICD-10-CM

## 2015-10-14 DIAGNOSIS — I509 Heart failure, unspecified: Secondary | ICD-10-CM

## 2015-10-14 DIAGNOSIS — M545 Low back pain: Secondary | ICD-10-CM

## 2015-10-14 NOTE — Telephone Encounter (Signed)
Patient called requesting medication refill for Lasix, Flexeril and Potassium. Please follow up.

## 2015-10-15 ENCOUNTER — Other Ambulatory Visit: Payer: Self-pay | Admitting: Family Medicine

## 2015-10-15 DIAGNOSIS — M545 Low back pain: Principal | ICD-10-CM

## 2015-10-15 DIAGNOSIS — G8929 Other chronic pain: Secondary | ICD-10-CM

## 2015-10-15 MED FILL — AMLODIPINE BESYLATE 10 MG T: 10 | 30 days supply | Qty: 30 | Fill #0

## 2015-10-15 MED FILL — BIDIL TABLET: 20-37.5 | 30 days supply | Qty: 90 | Fill #1

## 2015-10-15 NOTE — Telephone Encounter (Signed)
Pt. Called requesting a med refill on the following medications:  Tylenol # 3 Lasix Potassium Flexeril   Please f/u

## 2015-10-16 MED ORDER — FUROSEMIDE 40 MG PO TABS: 40.0000 mg | ORAL_TABLET | Freq: Two times a day (BID) | ORAL | Status: DC

## 2015-10-16 MED ORDER — CYCLOBENZAPRINE HCL 10 MG PO TABS: 10.0000 mg | ORAL_TABLET | Freq: Three times a day (TID) | ORAL | Status: DC | PRN

## 2015-10-16 MED ORDER — POTASSIUM CHLORIDE CRYS ER 20 MEQ PO TBCR: 40.0000 meq | EXTENDED_RELEASE_TABLET | Freq: Every day | ORAL | Status: DC

## 2015-10-16 MED FILL — POTASSIUM CL ER 20 MEQ TAB: 20 | 30 days supply | Qty: 60 | Fill #0

## 2015-10-16 MED FILL — FUROSEMIDE 40 MG TABLET: 40 | 30 days supply | Qty: 60 | Fill #0

## 2015-10-16 NOTE — Telephone Encounter (Signed)
Tylenol #3 refilled  

## 2015-10-16 NOTE — Telephone Encounter (Signed)
Rx refills send to [facility]  

## 2015-10-16 NOTE — Telephone Encounter (Signed)
[facility] Rx at front office ready to be pickup 

## 2015-10-21 MED FILL — CYCLOBENZAPRINE 10 MG TAB: 10 | 10 days supply | Qty: 30 | Fill #0

## 2015-10-21 MED FILL — METOPROLOL TARTRATE 25 MG T: 25 | 30 days supply | Qty: 60 | Fill #2

## 2015-10-21 MED FILL — ACETAMINOPHEN/COD #3 TABLET: 300-30 | 30 days supply | Qty: 90 | Fill #0

## 2015-10-21 MED FILL — SERTRALINE HCL 100 MG TAB: 100 | 30 days supply | Qty: 30 | Fill #2

## 2015-11-05 MED FILL — ALLOPURINOL 300 MG TABLET: 300 | 30 days supply | Qty: 30 | Fill #3

## 2015-11-05 MED FILL — ?CETIRIZINE HCL 10 MG TABLE: 10 | 30 days supply | Qty: 30 | Fill #11

## 2015-11-19 MED FILL — BIDIL TABLET: 20-37.5 | 30 days supply | Qty: 90 | Fill #2

## 2015-11-19 MED FILL — CYCLOBENZAPRINE 10 MG TAB: 10 | 10 days supply | Qty: 30 | Fill #1

## 2015-11-19 MED FILL — AMLODIPINE BESYLATE 10 MG T: 10 | 30 days supply | Qty: 30 | Fill #1

## 2015-11-24 MED FILL — METOPROLOL TARTRATE 25 MG T: 25 | 30 days supply | Qty: 60 | Fill #3

## 2015-11-30 ENCOUNTER — Other Ambulatory Visit: Payer: Self-pay | Admitting: Family Medicine

## 2015-11-30 ENCOUNTER — Other Ambulatory Visit: Payer: Self-pay | Admitting: Internal Medicine

## 2015-11-30 DIAGNOSIS — M545 Low back pain: Principal | ICD-10-CM

## 2015-11-30 DIAGNOSIS — M542 Cervicalgia: Secondary | ICD-10-CM

## 2015-11-30 DIAGNOSIS — G8929 Other chronic pain: Secondary | ICD-10-CM

## 2015-11-30 MED FILL — SERTRALINE HCL 100 MG TAB: 100 | 30 days supply | Qty: 30 | Fill #0

## 2015-11-30 MED FILL — POTASSIUM CL ER 20 MEQ TAB: 20 | 30 days supply | Qty: 60 | Fill #0

## 2015-11-30 MED FILL — FUROSEMIDE 40 MG TABLET: 40 | 30 days supply | Qty: 60 | Fill #0

## 2015-12-01 NOTE — Telephone Encounter (Signed)
Tylenol #3 and flexeril refilled

## 2015-12-02 MED FILL — CYCLOBENZAPRINE 10 MG TAB: 10 | 10 days supply | Qty: 30 | Fill #0

## 2015-12-02 NOTE — Telephone Encounter (Signed)
Pt notified Rx at front office ready to be pick up  Pt aware need to sign Controlled substance agreement

## 2015-12-11 ENCOUNTER — Ambulatory Visit: Payer: Medicaid Other | Attending: Family Medicine | Admitting: Family Medicine

## 2015-12-11 ENCOUNTER — Ambulatory Visit (HOSPITAL_COMMUNITY)
Admission: RE | Admit: 2015-12-11 | Discharge: 2015-12-11 | Disposition: A | Discharge status: Home / Self Care | Payer: Medicaid Other | Source: Ambulatory Visit | Attending: Family Medicine | Admitting: Family Medicine

## 2015-12-11 ENCOUNTER — Encounter: Payer: Self-pay | Admitting: Family Medicine

## 2015-12-11 VITALS — BP 130/90 | HR 78 | Temp 97.9°F | Resp 18 | Ht 66.0 in | Wt 365.2 lb

## 2015-12-11 DIAGNOSIS — M542 Cervicalgia: Secondary | ICD-10-CM | POA: Diagnosis present

## 2015-12-11 DIAGNOSIS — I5042 Chronic combined systolic (congestive) and diastolic (congestive) heart failure: Secondary | ICD-10-CM | POA: Diagnosis not present

## 2015-12-11 DIAGNOSIS — I10 Essential (primary) hypertension: Secondary | ICD-10-CM | POA: Diagnosis not present

## 2015-12-11 DIAGNOSIS — Z9989 Dependence on other enabling machines and devices: Secondary | ICD-10-CM

## 2015-12-11 DIAGNOSIS — G4733 Obstructive sleep apnea (adult) (pediatric): Secondary | ICD-10-CM

## 2015-12-11 DIAGNOSIS — G8929 Other chronic pain: Secondary | ICD-10-CM | POA: Diagnosis not present

## 2015-12-11 DIAGNOSIS — M5031 Other cervical disc degeneration,  high cervical region: Secondary | ICD-10-CM | POA: Diagnosis not present

## 2015-12-11 MED ORDER — NAPROXEN 500 MG PO TABS: 500.0000 mg | ORAL_TABLET | Freq: Two times a day (BID) | ORAL | Status: DC

## 2015-12-11 MED ORDER — DICLOFENAC SODIUM 3 % TD GEL: 1.0000 g | Freq: Four times a day (QID) | TRANSDERMAL | Status: DC | PRN

## 2015-12-11 MED FILL — NAPROXEN 500 MG TABLET: 500 | 7 days supply | Qty: 14 | Fill #0

## 2015-12-11 MED FILL — DICLOFENAC SODIUM 3% GEL: 3 | 20 days supply | Qty: 100 | Fill #0

## 2015-12-11 MED FILL — ACETAMINOPHEN/COD #3 TABLET: 300-30 | 30 days supply | Qty: 90 | Fill #0

## 2015-12-11 NOTE — Patient Instructions (Addendum)
Diagnoses and all orders for this visit:  Chronic neck pain -     Diclofenac Sodium 3 % GEL; Place 1-2 g onto the skin 4 (four) times daily as needed (neck pain). -     DG Cervical Spine 2 or 3 views; Future -     naproxen (NAPROSYN) 500 MG tablet; Take 1 tablet (500 mg total) by mouth 2 (two) times daily with a meal. For one week -     Ambulatory referral to Chiropractic    BP is doing well Weight is up    Continue low salt diet. Check salt content in foods and choose lower salt options  Work on portion control with nutritionist, referral placed  F/u in 6 weeks for neck pain   Dr. Armen PickupFunches   DASH Eating Plan DASH stands for "Dietary Approaches to Stop Hypertension." The DASH eating plan is a healthy eating plan that has been shown to reduce high blood pressure (hypertension). Additional health benefits may include reducing the risk of type 2 diabetes mellitus, heart disease, and stroke. The DASH eating plan may also help with weight loss. WHAT DO I NEED TO KNOW ABOUT THE DASH EATING PLAN? For the DASH eating plan, you will follow these general guidelines:  Choose foods with a percent daily value for sodium of less than 5% (as listed on the food label).  Use salt-free seasonings or herbs instead of table salt or sea salt.  Check with your health care provider or pharmacist before using salt substitutes.  Eat lower-sodium products, often labeled as "lower sodium" or "no salt added."  Eat fresh foods.  Eat more vegetables, fruits, and low-fat dairy products.  Choose whole grains. Look for the word "whole" as the first word in the ingredient list.  Choose fish and skinless chicken or [place] more often than red meat. Limit fish, poultry, and meat to 6 oz (170 g) each day.  Limit sweets, desserts, sugars, and sugary drinks.  Choose heart-healthy fats.  Limit cheese to 1 oz (28 g) per day.  Eat more home-cooked food and less restaurant, buffet, and fast food.  Limit fried  foods.  Cook foods using methods other than frying.  Limit canned vegetables. If you do use them, rinse them well to decrease the sodium.  When eating at a restaurant, ask that your food be prepared with less salt, or no salt if possible. WHAT FOODS CAN I EAT? Seek help from a dietitian for individual calorie needs. Grains Whole grain or whole wheat bread. Brown rice. Whole grain or whole wheat pasta. Quinoa, bulgur, and whole grain cereals. Low-sodium cereals. Corn or whole wheat flour tortillas. Whole grain cornbread. Whole grain crackers. Low-sodium crackers. Vegetables Fresh or frozen vegetables (raw, steamed, roasted, or grilled). Low-sodium or reduced-sodium tomato and vegetable juices. Low-sodium or reduced-sodium tomato sauce and paste. Low-sodium or reduced-sodium canned vegetables.  Fruits All fresh, canned (in natural juice), or frozen fruits. Meat and Other Protein Products Ground beef (85% or leaner), grass-fed beef, or beef trimmed of fat. Skinless chicken or [place]. Ground chicken or [place]. Pork trimmed of fat. All fish and seafood. Eggs. Dried beans, peas, or lentils. Unsalted nuts and seeds. Unsalted canned beans. Dairy Low-fat dairy products, such as skim or 1% milk, 2% or reduced-fat cheeses, low-fat ricotta or cottage cheese, or plain low-fat yogurt. Low-sodium or reduced-sodium cheeses. Fats and Oils Tub margarines without trans fats. Light or reduced-fat mayonnaise and salad dressings (reduced sodium). Avocado. Safflower, olive, or canola oils. Natural peanut  or almond butter. Other Unsalted popcorn and pretzels. The items listed above may not be a complete list of recommended foods or beverages. Contact your dietitian for more options. WHAT FOODS ARE NOT RECOMMENDED? Grains White bread. White pasta. White rice. Refined cornbread. Bagels and croissants. Crackers that contain trans fat. Vegetables Creamed or fried vegetables. Vegetables in a cheese sauce. Regular  canned vegetables. Regular canned tomato sauce and paste. Regular tomato and vegetable juices. Fruits Dried fruits. Canned fruit in light or heavy syrup. Fruit juice. Meat and Other Protein Products Fatty cuts of meat. Ribs, chicken wings, bacon, sausage, bologna, salami, chitterlings, fatback, hot dogs, bratwurst, and packaged luncheon meats. Salted nuts and seeds. Canned beans with salt. Dairy Whole or 2% milk, cream, half-and-half, and cream cheese. Whole-fat or sweetened yogurt. Full-fat cheeses or blue cheese. Nondairy creamers and whipped toppings. Processed cheese, cheese spreads, or cheese curds. Condiments Onion and garlic salt, seasoned salt, table salt, and sea salt. Canned and packaged gravies. Worcestershire sauce. Tartar sauce. Barbecue sauce. Teriyaki sauce. Soy sauce, including reduced sodium. Steak sauce. Fish sauce. Oyster sauce. Cocktail sauce. Horseradish. Ketchup and mustard. Meat flavorings and tenderizers. Bouillon cubes. Hot sauce. Tabasco sauce. Marinades. Taco seasonings. Relishes. Fats and Oils Butter, stick margarine, lard, shortening, ghee, and bacon fat. Coconut, palm kernel, or palm oils. Regular salad dressings. Other Pickles and olives. Salted popcorn and pretzels. The items listed above may not be a complete list of foods and beverages to avoid. Contact your dietitian for more information. WHERE CAN I FIND MORE INFORMATION? National Heart, Lung, and Blood Institute: [employer]   This information is not intended to replace advice given to you by your health care provider. Make sure you discuss any questions you have with your health care provider.   Document Released: 06/02/2011 Document Revised: 07/04/2014 Document Reviewed: 04/17/2013 Elsevier Interactive Patient Education [employer]

## 2015-12-11 NOTE — Progress Notes (Signed)
Subjective:  Patient ID: Rickey Calderon, male    DOB: 05-23-1966  Age: 50 y.o. MRN: [id number]  CC: No chief complaint on file.   HPI Rickey Calderon presents for   1. CHF: diagnosed one month with elevated pro-BNP and cardiomegaly on CXR. Has mixed CHF. Has established with cardiology. Denies CP and SOB. Admits to somnolence. Not using CPAP as he needs new tubing. Compliant with all meds. Compliant with low salt diet.   2. Neck pain: x one year or more. Pain with stiffness. B/l neck. Worse when he cracks his neck. No swelling. Pain does not radiates to shoulders or arms. No recent trauma. Pain is 5/10 during the day. Pain worsens at night.    3. OSA: not using CPAP as he needs new tubing. He admits to daytime somnolence. No CP or SOB.   Social History  Substance Use Topics  . Smoking status: Never Smoker   . Smokeless tobacco: Never Used  . Alcohol Use: No    Outpatient Prescriptions Prior to Visit  Medication Sig Dispense Refill  . acetaminophen-codeine (TYLENOL #3) 300-30 MG tablet TAKE 1 TABLET BY MOUTH EVERY 8 HOURS AS NEEDED FOR MODERATE PAIN 90 tablet 2  . albuterol (PROVENTIL HFA;VENTOLIN HFA) 108 (90 Base) MCG/ACT inhaler Inhale 2 puffs into the lungs every 6 (six) hours as needed for wheezing or shortness of breath. 1 Inhaler 0  . allopurinol (ZYLOPRIM) 300 MG tablet TAKE 1 TABLET BY MOUTH DAILY 30 tablet 3  . amLODipine (NORVASC) 10 MG tablet Take 1 tablet (10 mg total) by mouth daily. 30 tablet 5  . cetirizine (ZYRTEC) 10 MG tablet TAKE 1 TABLET BY MOUTH DAILY. 30 tablet 2  . cyclobenzaprine (FLEXERIL) 10 MG tablet TAKE 1 TABLET BY MOUTH 3 TIMES DAILY AS NEEDED FOR MUSCLE SPASMS. 30 tablet 2  . Fluticasone-Salmeterol (ADVAIR) 100-50 MCG/DOSE AEPB Inhale 1 puff into the lungs 2 (two) times daily. 60 each 2  . furosemide (LASIX) 40 MG tablet TAKE 1 TABLET BY MOUTH 2 TIMES DAILY. 60 tablet 2  . gabapentin (NEURONTIN) 300 MG capsule Take 1 capsule (300 mg total) by mouth 3  (three) times daily. 90 capsule 3  . isosorbide-hydrALAZINE (BIDIL) 20-37.5 MG tablet Take 1 tablet by mouth 3 (three) times daily. 90 tablet 6  . losartan (COZAAR) 100 MG tablet Take 1 tablet (100 mg total) by mouth daily. 30 tablet 5  . metoprolol tartrate (LOPRESSOR) 50 MG tablet Take 1 tablet (50 mg total) by mouth 2 (two) times daily. 60 tablet 3  . Potassium Chloride ER 20 MEQ TBCR TAKE 2 TABLETS BY MOUTH DAILY. 60 tablet 2  . sertraline (ZOLOFT) 100 MG tablet TAKE 1 TABLET BY MOUTH DAILY 30 tablet 2  . tamsulosin (FLOMAX) 0.4 MG CAPS capsule Take 1 capsule (0.4 mg total) by mouth daily after supper. 30 capsule 3   No facility-administered medications prior to visit.    ROS Review of Systems  Constitutional: Positive for fatigue. Negative for fever, chills and unexpected weight change.  HENT: Negative for ear discharge and ear pain.   Eyes: Negative for visual disturbance.  Respiratory: Negative for cough and shortness of breath.   Cardiovascular: Negative for chest pain, palpitations and leg swelling.  Gastrointestinal: Negative for nausea, vomiting, abdominal pain, diarrhea, constipation and blood in stool.  Endocrine: Negative for polydipsia, polyphagia and polyuria.  Musculoskeletal: Positive for back pain, neck pain and neck stiffness. Negative for myalgias, arthralgias and gait problem.  Skin: Negative for rash.  Allergic/Immunologic: Negative for immunocompromised state.  Neurological: Positive for dizziness and light-headedness. Negative for syncope and headaches.  Hematological: Negative for adenopathy. Does not bruise/bleed easily.  Psychiatric/Behavioral: Positive for sleep disturbance and dysphoric mood. Negative for suicidal ideas. The patient is not nervous/anxious.     Objective:  BP 130/90 mmHg  Pulse 78  Temp(Src) 97.9 F (36.6 C) (Oral)  Resp 18  Ht  (1.676 m)  Wt 365 lb 3.2 oz (165.654 kg)  BMI 58.97 kg/m2  SpO2 100%  BP/Weight 12/11/2015 09/29/2015  09/21/2015  Systolic BP 130 140 149  Diastolic BP 90 96 88  Wt. (Lbs) 365.2 360 365  BMI 58.97 58.13 58.94    Physical Exam  Constitutional: He appears well-developed and well-nourished. No distress.  Morbid obesity   HENT:  Head: Normocephalic and atraumatic.  Right Ear: Tympanic membrane, external ear and ear canal normal.  Left Ear: External ear and ear canal normal.  Neck: Normal range of motion and full passive range of motion without pain. Neck supple. Muscular tenderness present. No spinous process tenderness present. No rigidity. No edema, no erythema and normal range of motion present.  Cardiovascular: Normal rate, regular rhythm, normal heart sounds and intact distal pulses.   Pulmonary/Chest: Effort normal.  Musculoskeletal: He exhibits edema. Tenderness: 1+ b/l LE   Neurological: He is alert.  Skin: Skin is warm and dry. No rash noted. No erythema.  Psychiatric: He has a normal mood and affect.   GAD 7 : Generalized Anxiety Score 12/11/2015 08/20/2015 07/10/2015 04/30/2015  Nervous, Anxious, on Edge 0 0 0 0  Control/stop worrying 1 0 0 1  Worry too much - different things 1 0 1 1  Trouble relaxing 1 0 0 0  Restless 0 0 0 0  Easily annoyed or irritable Afraid - awful might happen 0 0 0 0  Total GAD 7 Score Depression screen [facility] 2/9 12/11/2015 09/21/2015 08/20/2015 07/10/2015 04/30/2015  Decreased Interest 1 0 0 0 0  Down, Depressed, Hopeless 1 0 PHQ - 2 Score 2 0 Altered sleeping -  Tired, decreased energy 1 0 1 1 -  Change in appetite 0 0 1 1 -  Feeling bad or failure about yourself  -  Trouble concentrating 0 0 0 0 -  Moving slowly or fidgety/restless 0 0 0 0 -  Suicidal thoughts 0 0 0 0 -  PHQ-9 Score -      Assessment & Plan:  Diagnoses and all orders for this visit:  Chronic neck pain -     Diclofenac Sodium 3 % GEL; Place 1-2 g onto the skin 4 (four) times daily as needed (neck pain). -     DG  Cervical Spine 2 or 3 views; Future -     naproxen (NAPROSYN) 500 MG tablet; Take 1 tablet (500 mg total) by mouth 2 (two) times daily with a meal. For one week -     Ambulatory referral to Chiropractic  Morbid obesity due to excess calories (HCC) -     Amb ref to Medical Nutrition Therapy-MNT  Chronic combined systolic and diastolic heart failure, NYHA class 2 (HCC) -     Amb ref to Medical Nutrition Therapy-MNT  Essential hypertension  OSA on CPAP -     Ambulatory referral to Pulmonology   Meds ordered  this encounter  Medications  . Diclofenac Sodium 3 % GEL    Sig: Place 1-2 g onto the skin 4 (four) times daily as needed (neck pain).    Dispense:  100 g    Refill:  2  . naproxen (NAPROSYN) 500 MG tablet    Sig: Take 1 tablet (500 mg total) by mouth 2 (two) times daily with a meal. For one week    Dispense:  14 tablet    Refill:  0    Follow-up: Return in about 6 weeks (around 01/22/2016) for neck pain .   Dessa PhiJosalyn Damiano Stamper MD

## 2015-12-14 ENCOUNTER — Other Ambulatory Visit: Payer: Self-pay | Admitting: Family Medicine

## 2015-12-14 MED FILL — GABAPENTIN 300 MG CAPSULE: 300 | 30 days supply | Qty: 90 | Fill #3

## 2015-12-14 MED FILL — ALLOPURINOL 300 MG TABLET: 300 | 30 days supply | Qty: 30 | Fill #4

## 2015-12-14 MED FILL — TAMSULOSIN HCL 0.4 MG CAP: 0.4 | 30 days supply | Qty: 30 | Fill #0

## 2015-12-14 NOTE — Assessment & Plan Note (Signed)
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

## 2015-12-14 NOTE — Assessment & Plan Note (Signed)
OSA in setting of morbid obesity and CHF, non compliant with CPAP  Referral to pulmonology

## 2015-12-14 NOTE — Assessment & Plan Note (Signed)
BP at goal Patient advised to comply with diuretics, low salt diet, exercise and CPAP

## 2015-12-16 ENCOUNTER — Other Ambulatory Visit: Payer: Self-pay | Admitting: Pharmacist

## 2015-12-17 ENCOUNTER — Other Ambulatory Visit: Payer: Self-pay | Admitting: Family Medicine

## 2015-12-17 DIAGNOSIS — G8929 Other chronic pain: Secondary | ICD-10-CM

## 2015-12-17 DIAGNOSIS — M542 Cervicalgia: Principal | ICD-10-CM

## 2015-12-18 MED FILL — AMLODIPINE BESYLATE 10 MG T: 10 | 30 days supply | Qty: 30 | Fill #2

## 2015-12-21 MED FILL — BIDIL TABLET: 20-37.5 | 30 days supply | Qty: 90 | Fill #3

## 2015-12-22 MED FILL — METOPROLOL TARTRATE 25 MG T: 25 | 30 days supply | Qty: 60 | Fill #4

## 2016-01-05 ENCOUNTER — Telehealth: Payer: Self-pay

## 2016-01-05 NOTE — Telephone Encounter (Signed)
-----   Message from Dessa PhiJosalyn Funches, MD sent at 12/17/2015  9:46 AM EDT ----- Patient referred to PT

## 2016-01-05 NOTE — Telephone Encounter (Signed)
Rn verified patient is aware of x ray report results.

## 2016-01-11 ENCOUNTER — Encounter: Payer: Medicaid Other | Attending: Family Medicine | Admitting: Dietician

## 2016-01-11 ENCOUNTER — Encounter: Payer: Self-pay | Admitting: Dietician

## 2016-01-11 ENCOUNTER — Other Ambulatory Visit: Payer: Self-pay | Admitting: Family Medicine

## 2016-01-11 DIAGNOSIS — Z713 Dietary counseling and surveillance: Secondary | ICD-10-CM | POA: Insufficient documentation

## 2016-01-11 DIAGNOSIS — I5042 Chronic combined systolic (congestive) and diastolic (congestive) heart failure: Secondary | ICD-10-CM | POA: Diagnosis present

## 2016-01-11 MED FILL — SERTRALINE HCL 100 MG TAB: 100 | 30 days supply | Qty: 30 | Fill #1

## 2016-01-11 MED FILL — POTASSIUM CL ER 20 MEQ TAB: 20 | 30 days supply | Qty: 60 | Fill #1

## 2016-01-11 MED FILL — TAMSULOSIN HCL 0.4 MG CAP: 0.4 | 30 days supply | Qty: 30 | Fill #1

## 2016-01-11 MED FILL — CYCLOBENZAPRINE 10 MG TAB: 10 | 10 days supply | Qty: 30 | Fill #1

## 2016-01-11 MED FILL — DICLOFENAC SODIUM 3% GEL: 3 | 20 days supply | Qty: 100 | Fill #1

## 2016-01-11 MED FILL — ACETAMINOPHEN/COD #3 TABLET: 300-30 | 30 days supply | Qty: 90 | Fill #1

## 2016-01-11 MED FILL — ALLOPURINOL 300 MG TABLET: 300 | 30 days supply | Qty: 30 | Fill #0

## 2016-01-11 MED FILL — FUROSEMIDE 40 MG TABLET: 40 | 30 days supply | Qty: 60 | Fill #1

## 2016-01-11 MED FILL — ADVAIR 100/50 DISKUS: 100-50 | 30 days supply | Qty: 60 | Fill #1

## 2016-01-11 MED FILL — NAPROXEN 500 MG TABLET: 500 | 7 days supply | Qty: 14 | Fill #0

## 2016-01-11 NOTE — Progress Notes (Signed)
  Medical Nutrition Therapy:  Appt start time: 155 end time:  235   Assessment:  Primary concerns today: Rickey Calderon is here today to discuss his eating habits. He feels like he eats very large portions. States that he tries to avoid sweets. Patient states he is not on a fluid restriction. He lives with his wife and 51 year old son. His wife does most of the cooking and he and his wife share the grocery shopping. He and his wife both have CHF. He is not currently working and has limited funds. Uses public transportation. Rickey Calderon states he does not add salt to his food.  He reports previous counseling on the DASH diet and has these handouts with him today. He verbalizes understanding that he should contact his physician with significant, sudden weight changes.  Preferred Learning Style:   No preference indicated   Learning Readiness:   Contemplating  Ready  MEDICATIONS: see list   DIETARY INTAKE:  Avoided foods include yogurt. Does not like a lot of fruits (peaches). Eats a variety of vegetables.   24-hr recall:  Wakes up around 9am   B ( AM): usually skips  Snk ( AM):   L (2 PM): Two cheese and bologna or other deli meat sandwiches, something sweet Snk ( PM):  D (6:30-7 PM): macaroni and cheese, spinach  Snk ( PM): cake or cookies or donuts  Goes to bed around 3am  Beverages: water, soda occasionally, coffee with sugar and cream, sweet tea  Usual physical activity: walking if he misses the bus  Estimated energy needs: 1800-2000 calories 200-225 g carbohydrates 135-150 g protein 50-56 g fat  Progress Towards Goal(s):  In progress.   Nutritional Diagnosis:  NB-1.1 Food and nutrition-related knowledge deficit As related to lack of previous nutrition education regarding dietary sodium and meal balance.  As evidenced by patient report.    Intervention:  Nutrition counseling provided. Discussed meal balance (Plate Method). Encouraged patient to begin eating breakfast to  decrease snacking and portion sizes in the evenings. Reviewed methods for reducing dietary sodium. Encouraged increased activity. Recommended less sugar sweetened beverages.   Goals: -Try Premier, Atkins, or EAS AdvantEdge protein shakes as coffee creamer -Practice drinking more water and less drinks with sugar (use Splenda in tea) -Try walking the house or doing chair exercises during commercials -Follow the Plate Method  -Fill up on non starchy vegetables (any veggie that is NOT corn, peas, or potatoes)  -Choose fresh or frozen veggies  -Rinse canned foods when possible -Continue to avoid adding salt to foods -Try baking/broiling more instead of frying -Start eating more in the beginning of the day  -Peanut butter toast OR 1-2 boiled eggs and fruit OR [facility] Protein bar  Teaching Method Utilized: Scientific laboratory technicianVisual Auditory Hands on  Handouts given during visit include:  Chair exercises  MyPlate  Barriers to learning/adherence to lifestyle change: suspected learning deficit  Demonstrated degree of understanding via:  Teach Back   Monitoring/Evaluation:  Dietary intake, exercise, and body weight in 2 month(s).

## 2016-01-11 NOTE — Patient Instructions (Addendum)
-  Try Premier, Vickey SagesAtkins, or EAS AdvantEdge protein shakes as coffee creamer -Practice drinking more water and less drinks with sugar (use Splenda in tea) -Try walking the house or doing chair exercises during commercials -Follow the Plate Method  -Fill up on non starchy vegetables (any veggie that is NOT corn, peas, or potatoes)  -Choose fresh or frozen veggies  -Rinse canned foods when possible -Continue to avoid adding salt to foods -Try baking/broiling more instead of frying -Start eating more in the beginning of the day  -Peanut butter toast OR 1-2 boiled eggs and fruit OR [facility] Protein bar

## 2016-01-22 ENCOUNTER — Ambulatory Visit: Payer: Medicaid Other | Admitting: Cardiology

## 2016-01-29 MED FILL — BIDIL TABLET: 20-37.5 | 30 days supply | Qty: 90 | Fill #4

## 2016-01-29 MED FILL — AMLODIPINE BESYLATE 10 MG T: 10 | 30 days supply | Qty: 30 | Fill #3

## 2016-01-29 MED FILL — GABAPENTIN 300 MG CAPSULE: 300 | 30 days supply | Qty: 90 | Fill #0

## 2016-01-29 MED FILL — METOPROLOL TARTRATE 25 MG T: 25 | 30 days supply | Qty: 60 | Fill #5

## 2016-02-01 ENCOUNTER — Other Ambulatory Visit: Payer: Self-pay | Admitting: Family Medicine

## 2016-02-01 DIAGNOSIS — N183 Chronic kidney disease, stage 3 unspecified: Secondary | ICD-10-CM

## 2016-02-01 DIAGNOSIS — G8929 Other chronic pain: Secondary | ICD-10-CM

## 2016-02-01 DIAGNOSIS — M1000 Idiopathic gout, unspecified site: Secondary | ICD-10-CM

## 2016-02-01 DIAGNOSIS — M542 Cervicalgia: Secondary | ICD-10-CM

## 2016-02-01 DIAGNOSIS — M545 Low back pain: Principal | ICD-10-CM

## 2016-02-01 NOTE — Telephone Encounter (Signed)
Patient is requesting naproxen. Patient would like more than a week supply.

## 2016-02-02 ENCOUNTER — Other Ambulatory Visit: Payer: Self-pay | Admitting: Family Medicine

## 2016-02-02 DIAGNOSIS — N183 Chronic kidney disease, stage 3 unspecified: Secondary | ICD-10-CM | POA: Insufficient documentation

## 2016-02-02 MED ORDER — DICLOFENAC SODIUM 1 % TD GEL: 2.0000 g | Freq: Four times a day (QID) | TRANSDERMAL | 5 refills | Status: DC

## 2016-02-02 NOTE — Telephone Encounter (Signed)
Pt called requesting status of medication request, pt was told that a nurse will f/up to review what medications he should take.

## 2016-02-02 NOTE — Telephone Encounter (Signed)
Patient with CKD stage 3 which means that his kidney function is less than normal.  This is due for HTN and CHF.  For this reason, naproxen or any other oral NSAID ( eg ibuprofen)  should not be the first line for pain. It should be used very sparingly, no more than 7 days at a time if taking daily for a flare up of inflammatory pain with a break of at least 3 weeks in between.   I advise tylenol 650 mg mg up to 3 times daily as needed for pain.  Along with tylenol #3 and topical diclofenac gel.   If he has a gout flare he should call for a course of steroids

## 2016-02-03 NOTE — Telephone Encounter (Signed)
Pt clld back in regarding Rx for Naproxen. Pt stated he's not presently having a gout flare up but having neck pain.  Advsd of PCP's advice and notation.   Emphasized to pt to be very careful and follow the instructions for taking OTC Tylenol 650 mg due to taking Rx Tylenol #3 as well, to make sure he's not going over the maximum 24(daily) dose. Pt stated he understood and would read all instructions.

## 2016-02-10 MED FILL — POTASSIUM CL ER 20 MEQ TAB: 20 | 30 days supply | Qty: 60 | Fill #2

## 2016-02-10 MED FILL — ALLOPURINOL 300 MG TABLET: 300 | 30 days supply | Qty: 30 | Fill #1

## 2016-02-10 MED FILL — CYCLOBENZAPRINE 10 MG TAB: 10 | 10 days supply | Qty: 30 | Fill #2

## 2016-02-10 MED FILL — ACETAMINOPHEN/COD #3 TABLET: 300-30 | 30 days supply | Qty: 90 | Fill #2

## 2016-02-10 MED FILL — TAMSULOSIN HCL 0.4 MG CAP: 0.4 | 30 days supply | Qty: 30 | Fill #2

## 2016-02-10 MED FILL — SERTRALINE HCL 100 MG TAB: 100 | 30 days supply | Qty: 30 | Fill #2

## 2016-02-11 MED FILL — FUROSEMIDE 20 MG TABLET: 20 | 30 days supply | Qty: 120 | Fill #0

## 2016-02-12 ENCOUNTER — Ambulatory Visit: Payer: Medicaid Other | Attending: Family Medicine | Admitting: Family Medicine

## 2016-02-12 ENCOUNTER — Encounter: Payer: Self-pay | Admitting: Family Medicine

## 2016-02-12 VITALS — BP 129/84 | HR 69 | Temp 97.7°F | Ht 66.0 in | Wt 373.2 lb

## 2016-02-12 DIAGNOSIS — Z79899 Other long term (current) drug therapy: Secondary | ICD-10-CM | POA: Diagnosis not present

## 2016-02-12 DIAGNOSIS — I5042 Chronic combined systolic (congestive) and diastolic (congestive) heart failure: Secondary | ICD-10-CM | POA: Insufficient documentation

## 2016-02-12 DIAGNOSIS — N183 Chronic kidney disease, stage 3 unspecified: Secondary | ICD-10-CM

## 2016-02-12 DIAGNOSIS — Z0000 Encounter for general adult medical examination without abnormal findings: Secondary | ICD-10-CM

## 2016-02-12 DIAGNOSIS — G4733 Obstructive sleep apnea (adult) (pediatric): Secondary | ICD-10-CM | POA: Diagnosis not present

## 2016-02-12 DIAGNOSIS — M542 Cervicalgia: Secondary | ICD-10-CM | POA: Diagnosis not present

## 2016-02-12 DIAGNOSIS — G8929 Other chronic pain: Secondary | ICD-10-CM | POA: Diagnosis not present

## 2016-02-12 DIAGNOSIS — J302 Other seasonal allergic rhinitis: Secondary | ICD-10-CM | POA: Insufficient documentation

## 2016-02-12 LAB — BASIC METABOLIC PANEL WITH GFR
BUN: 18 mg/dL (ref 7–25)
CHLORIDE: 104 mmol/L (ref 98–110)
CO2: 31 mmol/L (ref 20–31)
Calcium: 8.5 mg/dL — ABNORMAL LOW (ref 8.6–10.3)
Creat: 1.24 mg/dL (ref 0.70–1.33)
GFR, EST AFRICAN AMERICAN: 78 mL/min (ref >=60)
GFR, EST NON AFRICAN AMERICAN: 67 mL/min (ref >=60)
Glucose, Bld: 93 mg/dL (ref 65–99)
POTASSIUM: 3.8 mmol/L (ref 3.5–5.3)
Sodium: 141 mmol/L (ref 135–146)

## 2016-02-12 NOTE — Patient Instructions (Addendum)
Rickey Calderon was seen today for follow-up.  Diagnoses and all orders for this visit:  Chronic neck pain -     Ambulatory referral to Chiropractic  CKD (chronic kidney disease) stage 3, GFR 30-59 ml/min -     BASIC METABOLIC PANEL WITH GFR

## 2016-02-12 NOTE — Progress Notes (Signed)
Subjective:  Patient ID: Rickey Calderon, male    DOB: 09-Dec-1965  Age: 50 y.o. MRN: [id number]  CC: Follow-up (neck pain)   HPI Rickey Calderon presents for   1. CHF: diagnosed one month with elevated pro-BNP and cardiomegaly on CXR. Has mixed CHF. Has established with cardiology. Denies CP and SOB. Admits to somnolence. Not using CPAP as he needs new tubing. Compliant with all meds. Compliant with low salt diet.  He is interested in weight loss medication, he request a bontril for weight loss.   2. Neck pain: x one year or more. Pain with stiffness. B/l neck. Worse when he cracks his neck. No swelling. Pain does not radiates to shoulders or arms. No recent trauma. Pain is currently 3-4/10 during the day. Pain worsens at night.  Diclofenac gel does help. Naproxen was also helpful.   3. OSA: not using CPAP as he needs new tubing. He admits to daytime somnolence. No CP or SOB. He has an upcoming appt with pulmonology.   Social History  Substance Use Topics  . Smoking status: Never Smoker  . Smokeless tobacco: Never Used  . Alcohol use No    Outpatient Medications Prior to Visit  Medication Sig Dispense Refill  . acetaminophen-codeine (TYLENOL #3) 300-30 MG tablet TAKE 1 TABLET BY MOUTH EVERY 8 HOURS AS NEEDED FOR MODERATE PAIN 90 tablet 2  . albuterol (PROVENTIL HFA;VENTOLIN HFA) 108 (90 Base) MCG/ACT inhaler Inhale 2 puffs into the lungs every 6 (six) hours as needed for wheezing or shortness of breath. 1 Inhaler 0  . allopurinol (ZYLOPRIM) 300 MG tablet TAKE 1 TABLET BY MOUTH DAILY 30 tablet 3  . amLODipine (NORVASC) 10 MG tablet Take 1 tablet (10 mg total) by mouth daily. 30 tablet 5  . cetirizine (ZYRTEC) 10 MG tablet TAKE 1 TABLET BY MOUTH DAILY. 30 tablet 2  . cyclobenzaprine (FLEXERIL) 10 MG tablet TAKE 1 TABLET BY MOUTH 3 TIMES DAILY AS NEEDED FOR MUSCLE SPASMS. 30 tablet 2  . Diclofenac Sodium 3 % GEL Place 1-2 g onto the skin 4 (four) times daily as needed (neck pain). 100 g 2    . Fluticasone-Salmeterol (ADVAIR) 100-50 MCG/DOSE AEPB Inhale 1 puff into the lungs 2 (two) times daily. 60 each 2  . furosemide (LASIX) 40 MG tablet TAKE 1 TABLET BY MOUTH 2 TIMES DAILY. 60 tablet 2  . gabapentin (NEURONTIN) 300 MG capsule Take 1 capsule (300 mg total) by mouth 3 (three) times daily. 90 capsule 3  . isosorbide-hydrALAZINE (BIDIL) 20-37.5 MG tablet Take 1 tablet by mouth 3 (three) times daily. 90 tablet 6  . losartan (COZAAR) 100 MG tablet Take 1 tablet (100 mg total) by mouth daily. 30 tablet 5  . metoprolol tartrate (LOPRESSOR) 50 MG tablet Take 1 tablet (50 mg total) by mouth 2 (two) times daily. 60 tablet 3  . sertraline (ZOLOFT) 100 MG tablet TAKE 1 TABLET BY MOUTH DAILY 30 tablet 2  . tamsulosin (FLOMAX) 0.4 MG CAPS capsule TAKE 1 CAPSULE BY MOUTH DAILY AFTER SUPPER. 30 capsule 2  . diclofenac sodium (VOLTAREN) 1 % GEL Apply 2 g topically 4 (four) times daily. (Patient not taking: Reported on 02/12/2016) 100 g 5  . Potassium Chloride ER 20 MEQ TBCR TAKE 2 TABLETS BY MOUTH DAILY. 60 tablet 2   No facility-administered medications prior to visit.     ROS Review of Systems  Constitutional: Positive for fatigue. Negative for chills, fever and unexpected weight change.  HENT: Negative for ear discharge  and ear pain.   Eyes: Negative for visual disturbance.  Respiratory: Negative for cough and shortness of breath.   Cardiovascular: Negative for chest pain, palpitations and leg swelling.  Gastrointestinal: Negative for abdominal pain, blood in stool, constipation, diarrhea, nausea and vomiting.  Endocrine: Negative for polydipsia, polyphagia and polyuria.  Musculoskeletal: Positive for back pain, neck pain and neck stiffness. Negative for arthralgias, gait problem and myalgias.  Skin: Negative for rash.  Allergic/Immunologic: Negative for immunocompromised state.  Neurological: Negative for dizziness, syncope, light-headedness and headaches.  Hematological: Negative for  adenopathy. Does not bruise/bleed easily.  Psychiatric/Behavioral: Positive for dysphoric mood and sleep disturbance. Negative for suicidal ideas. The patient is not nervous/anxious.     Objective:  BP 129/84 (BP Location: Left Arm, Patient Position: Sitting, Cuff Size: Large)   Pulse 69   Temp 97.7 F (36.5 C) (Oral)   Ht 5\' 6"  (1.676 m)   Wt (!) 373 lb 3.2 oz (169.3 kg)   SpO2 96%   BMI 60.24 kg/m   BP/Weight 02/12/2016 01/11/2016 12/11/2015  Systolic BP 129 - 130  Diastolic BP 84 - 90  Wt. (Lbs) 373.2 370 365.2  BMI 60.24 57.94 58.97    Physical Exam  Constitutional: He appears well-developed and well-nourished. No distress.  Morbid obesity   HENT:  Head: Normocephalic and atraumatic.  Right Ear: Tympanic membrane, external ear and ear canal normal.  Left Ear: External ear and ear canal normal.  Neck: Normal range of motion and full passive range of motion without pain. Neck supple. Muscular tenderness present. No spinous process tenderness present. No neck rigidity. No edema, no erythema and normal range of motion present.  Cardiovascular: Normal rate, regular rhythm, normal heart sounds and intact distal pulses.   Pulmonary/Chest: Effort normal.  Musculoskeletal: He exhibits edema. Tenderness: 1+ b/l LE   Neurological: He is alert.  Skin: Skin is warm and dry. No rash noted. No erythema.  Psychiatric: He has a normal mood and affect.   GAD 7 : Generalized Anxiety Score 12/11/2015 08/20/2015 07/10/2015 04/30/2015  Nervous, Anxious, on Edge 0 0 0 0  Control/stop worrying 1 0 0 1  Worry too much - different things 1 0 1 1  Trouble relaxing 1 0 0 0  Restless 0 0 0 0  Easily annoyed or irritable 1 1 1 1   Afraid - awful might happen 0 0 0 0  Total GAD 7 Score 4 1 2 3     Depression screen [facility] 2/9 02/12/2016 01/11/2016 12/11/2015 09/21/2015 08/20/2015  Decreased Interest 0 0 1 0 0  Down, Depressed, Hopeless 0 0 1 0 1  PHQ - 2 Score 0 0 2 0 1  Altered sleeping - - 1 1 1   Tired,  decreased energy - - 1 0 1  Change in appetite - - 0 0 1  Feeling bad or failure about yourself  - - 1 1 1   Trouble concentrating - - 0 0 0  Moving slowly or fidgety/restless - - 0 0 0  Suicidal thoughts - - 0 0 0  PHQ-9 Score - - 5 2 5     Assessment & Plan:  Dionisio PaschalLennard was seen today for follow-up.  Diagnoses and all orders for this visit:  Healthcare maintenance -     Flu Vaccine QUAD 36+ mos IM  Chronic neck pain -     Ambulatory referral to Chiropractic  CKD (chronic kidney disease) stage 3, GFR 30-59 ml/min -     BASIC METABOLIC PANEL WITH GFR  No orders of the defined types were placed in this encounter.   Follow-up: Return in about 4 weeks (around 03/11/2016) for weight loss medication discussion.   Dessa PhiJosalyn Alysse Rathe MD

## 2016-02-15 NOTE — Assessment & Plan Note (Signed)
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

## 2016-02-18 NOTE — Progress Notes (Signed)
Cardiology Office Note   Date:  02/19/2016   ID:  Rickey Calderon, DOB 24-Jan-1966, MRN [id number]  PCP:  [name], [name], MD  Cardiologist:   Rollene RotundaJames Kenshin Splawn, MD   Chief Complaint  Patient presents with  . Cardiomyopathy      History of Present Illness: Rickey Calderon is a 50 y.o. male who presents for evaluation of cardiomyopathy. He's had dyspnea for some time. He became more short of breath with less activity earlier this year and actually went to the emergency room. His primary provider had ordered an echocardiogram. During his ER visit on he was found to have chest x-ray consistent with heart failure and elevated BNP. A couple of days later he came to our office for an echocardiogram was found to have an ejection fraction of 25-30%.  I saw him in March.  I ordered lower extremity Doppler and there was no evidence of residual clot from previous arterial thrombus.  I stopped Xarelto and drew a hypercoag work up.  This was negative.  Xarelto was stopped.  He came back to see Wilburt FinlayBryan Hager and had Norvasc increased as his BP was still elevated.  He returns for follow up.  Of note he's no longer taking losartan and looked back and see that his creatinine was elevated. He is taking BiDil. He's interested in losing weight and asked questions about taking a weight loss pill. He's not having any new shortness of breath, PND or orthopnea. He's had no change in weight. He's had no new edema. He denies any chest pressure, neck or arm discomfort. He is hoping to start working soon. He's had no leg pain.       Past Medical History:  Diagnosis Date  . Asthma   . Bronchitis   . CHF (congestive heart failure) (HCC)    EF 30% by echo  . Depression   . Femoral thrombosis (HCC)   . Gout   . Hypertension   . Morbid obesity (HCC)   . Prostatitis   . Seasonal allergies     Past Surgical History:  Procedure Laterality Date  . ABDOMINAL AORTAGRAM N/A 07/28/2014   Procedure: ABDOMINAL Ronny FlurryAORTAGRAM;   Surgeon: Chuck Hinthristopher S Dickson, MD;  [facility];  Service: Cardiovascular;  Laterality: N/A;  . LOWER EXTREMITY ANGIOGRAM Bilateral 07/28/2014   Procedure: LOWER EXTREMITY ANGIOGRAM;  Surgeon: Chuck Hinthristopher S Dickson, MD;  [facility];  Service: Cardiovascular;  Laterality: Bilateral;  . THROMBECTOMY FEMORAL ARTERY Right 08/01/2014   Procedure: THROMBECTOMY RIGHT LEG SFA, Anterior tibial, and perineal arteries with intraoperative arteriograms;  Surgeon: Nada LibmanVance W Brabham, MD;  [facility];  Service: Vascular;  Laterality: Right;     Current Outpatient Prescriptions  Medication Sig Dispense Refill  . acetaminophen-codeine (TYLENOL #3) 300-30 MG tablet TAKE 1 TABLET BY MOUTH EVERY 8 HOURS AS NEEDED FOR MODERATE PAIN 90 tablet 2  . albuterol (PROVENTIL HFA;VENTOLIN HFA) 108 (90 Base) MCG/ACT inhaler Inhale 2 puffs into the lungs every 6 (six) hours as needed for wheezing or shortness of breath. 1 Inhaler 0  . allopurinol (ZYLOPRIM) 300 MG tablet TAKE 1 TABLET BY MOUTH DAILY 30 tablet 3  . amLODipine (NORVASC) 10 MG tablet Take 1 tablet (10 mg total) by mouth daily. 30 tablet 5  . cetirizine (ZYRTEC) 10 MG tablet TAKE 1 TABLET BY MOUTH DAILY. 30 tablet 2  . cyclobenzaprine (FLEXERIL) 10 MG tablet TAKE 1 TABLET BY MOUTH 3 TIMES DAILY AS NEEDED FOR MUSCLE SPASMS. 30 tablet  2  . Diclofenac Sodium 3 % GEL Place 1-2 g onto the skin 4 (four) times daily as needed (neck pain). 100 g 2  . Fluticasone-Salmeterol (ADVAIR) 100-50 MCG/DOSE AEPB Inhale 1 puff into the lungs 2 (two) times daily. 60 each 2  . furosemide (LASIX) 40 MG tablet TAKE 1 TABLET BY MOUTH 2 TIMES DAILY. 60 tablet 2  . gabapentin (NEURONTIN) 300 MG capsule Take 1 capsule (300 mg total) by mouth 3 (three) times daily. 90 capsule 3  . isosorbide-hydrALAZINE (BIDIL) 20-37.5 MG tablet Take 1 tablet by mouth 3 (three) times daily. 90 tablet 6  . losartan (COZAAR) 100 MG tablet Take 1 tablet (100 mg total) by mouth daily. 30  tablet 5  . metoprolol tartrate (LOPRESSOR) 50 MG tablet Take 1 tablet (50 mg total) by mouth 2 (two) times daily. 60 tablet 3  . Potassium Chloride ER 20 MEQ TBCR TAKE 2 TABLETS BY MOUTH DAILY. 60 tablet 2  . sertraline (ZOLOFT) 100 MG tablet TAKE 1 TABLET BY MOUTH DAILY 30 tablet 2  . tamsulosin (FLOMAX) 0.4 MG CAPS capsule TAKE 1 CAPSULE BY MOUTH DAILY AFTER SUPPER. 30 capsule 2   No current facility-administered medications for this visit.     Allergies:   Review of patient's allergies indicates no known allergies.    ROS:  Please see the history of present illness.   Otherwise, review of systems are positive for none.   All other systems are reviewed and negative.    PHYSICAL EXAM: VS:  BP (!) 146/106   Pulse 71   Ht 5\' 7"  (1.702 m)   Wt (!) 373 lb (169.2 kg)   BMI 58.42 kg/m  , BMI Body mass index is 58.42 kg/m. GENERAL:  Well appearing NECK:  No jugular venous distention, waveform within normal limits, carotid upstroke brisk and symmetric, no bruits, no thyromegaly LYMPHATICS:  No cervical, inguinal adenopathy LUNGS:  Clear to auscultation bilaterally BACK:  No CVA tenderness CHEST:  Unremarkable HEART:  PMI not displaced or sustained,S1 and S2 within normal limits, no S3, no S4, no clicks, no rubs, no murmurs ABD:  Flat, positive bowel sounds normal in frequency in pitch, no bruits, no rebound, no guarding, no midline pulsatile mass, no hepatomegaly, no splenomegaly, obese EXT:  2 plus pulses throughout, mild edema, no cyanosis no clubbing SKIN:  No rashes no nodules    EKG:  EKG is not  ordered today.   Recent Labs: 03/31/2015: ALT 17; TSH 1.481 08/20/2015: Pro B Natriuretic peptide (BNP) 3,271.00 08/31/2015: B Natriuretic Peptide 1,024.5; Hemoglobin 12.4; Platelets 250 02/12/2016: BUN 18; Creat 1.24; Potassium 3.8; Sodium 141    Lipid Panel    Component Value Date/Time   CHOL 169 03/31/2015 1148   TRIG 88 03/31/2015 1148   HDL 54 03/31/2015 1148   CHOLHDL 3.1  03/31/2015 1148   VLDL 18 03/31/2015 1148   LDLCALC 97 03/31/2015 1148      Wt Readings from Last 3 Encounters:  02/19/16 (!) 373 lb (169.2 kg)  02/12/16 (!) 373 lb 3.2 oz (169.3 kg)  01/11/16 (!) 370 lb (167.8 kg)      Other studies Reviewed: Additional studies/ records that were reviewed today include:  Doppler. Review of the above records demonstrates:  Please see elsewhere in the note.     ASSESSMENT AND PLAN:  CARDIOMYOPATHY:   I suspect this is secondary to hypertension we will eventually need an ischemia workup once his meds a been titrated and his blood pressure somewhat  better.   I'm going to titrate his BiDil to tablets 3 times daily. I'll check an echocardiogram probably in October.  HTN:  This is being managed in the context of treating his CHF  SLEEP APNEA:  He understands and need to get his CPAP machine fixed.  He's actually going to see a pulmonologist.  OBESITY:  We began to talk about diet and the need for weight loss as part of this therapy.  I would not want to prescribe any of the weight loss medications at this point.   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 He's off his anticoagulant area    Current medicines are reviewed at length with the patient today.  The patient does not have concerns regarding medicines.  The following changes have been made:  As above  Labs/ tests ordered today include:   No orders of the defined types were placed in this encounter.    Disposition:   FU with me in Nov   Signed, Marrion Finan, MD  02/19/2016 11:06 AM    [facility]

## 2016-02-19 ENCOUNTER — Ambulatory Visit (INDEPENDENT_AMBULATORY_CARE_PROVIDER_SITE_OTHER): Payer: Medicaid Other | Admitting: Cardiology

## 2016-02-19 ENCOUNTER — Encounter: Payer: Self-pay | Admitting: Cardiology

## 2016-02-19 VITALS — BP 146/106 | HR 71 | Ht 67.0 in | Wt 373.0 lb

## 2016-02-19 DIAGNOSIS — I10 Essential (primary) hypertension: Secondary | ICD-10-CM | POA: Diagnosis not present

## 2016-02-19 DIAGNOSIS — I429 Cardiomyopathy, unspecified: Secondary | ICD-10-CM

## 2016-02-19 MED ORDER — ISOSORB DINITRATE-HYDRALAZINE 20-37.5 MG PO TABS: 2.0000 | ORAL_TABLET | Freq: Three times a day (TID) | ORAL | 11 refills | Status: DC

## 2016-02-19 MED FILL — BIDIL TABLET: 20-37.5 | 30 days supply | Qty: 180 | Fill #0

## 2016-02-19 NOTE — Patient Instructions (Signed)
Medication Instructions:  INCREASE Bidil 2 tablets 3 times a day  Labwork: None Ordered  Testing/Procedures: Your physician has requested that you have an echocardiogram in October. Echocardiography is a painless test that uses sound waves to create images of your heart. It provides your doctor with information about the size and shape of your heart and how well your heart's chambers and valves are working. This procedure takes approximately one hour. There are no restrictions for this procedure.  Follow-Up: Your physician recommends that you schedule a follow-up appointment in: After Echocardiogram   Any Other Special Instructions Will Be Listed Below (If Applicable).   If you need a refill on your cardiac medications before your next appointment, please call your pharmacy.

## 2016-02-26 ENCOUNTER — Institutional Professional Consult (permissible substitution): Payer: Medicaid Other | Admitting: Pulmonary Disease

## 2016-03-08 MED FILL — POTASSIUM CL ER 20 MEQ TAB: 20 | 30 days supply | Qty: 60 | Fill #3

## 2016-03-08 MED FILL — FUROSEMIDE 20 MG TABLET: 20 | 30 days supply | Qty: 120 | Fill #1

## 2016-03-08 MED FILL — CYCLOBENZAPRINE 10 MG TAB: 10 | 10 days supply | Qty: 30 | Fill #3

## 2016-03-08 MED FILL — ALLOPURINOL 300 MG TABLET: 300 | 30 days supply | Qty: 30 | Fill #2

## 2016-03-10 ENCOUNTER — Telehealth: Payer: Self-pay | Admitting: Family Medicine

## 2016-03-10 ENCOUNTER — Other Ambulatory Visit: Payer: Self-pay | Admitting: Pharmacist

## 2016-03-10 ENCOUNTER — Other Ambulatory Visit: Payer: Self-pay | Admitting: Family Medicine

## 2016-03-10 DIAGNOSIS — I10 Essential (primary) hypertension: Secondary | ICD-10-CM

## 2016-03-10 MED ORDER — METOPROLOL TARTRATE 50 MG PO TABS: 50.0000 mg | ORAL_TABLET | Freq: Two times a day (BID) | ORAL | 3 refills | Status: DC

## 2016-03-10 MED ORDER — AMLODIPINE BESYLATE 10 MG PO TABS: 10.0000 mg | ORAL_TABLET | Freq: Every day | ORAL | 2 refills | Status: DC

## 2016-03-10 MED FILL — AMLODIPINE BESYLATE 10 MG T: 10 | 30 days supply | Qty: 30 | Fill #4

## 2016-03-10 MED FILL — NAPROXEN SODIUM 550 MG TAB: 550 | 15 days supply | Qty: 30 | Fill #0

## 2016-03-10 MED FILL — SERTRALINE HCL 100 MG TAB: 100 | 30 days supply | Qty: 30 | Fill #0

## 2016-03-10 MED FILL — METOPROLOL TARTRATE 50 MG T: 50 | 30 days supply | Qty: 60 | Fill #0

## 2016-03-10 MED FILL — TAMSULOSIN HCL 0.4 MG CAP: 0.4 | 30 days supply | Qty: 30 | Fill #0

## 2016-03-10 MED FILL — GABAPENTIN 300 MG CAPSULE: 300 | 30 days supply | Qty: 90 | Fill #1

## 2016-03-10 NOTE — Telephone Encounter (Signed)
Pt. Called requesting a refill on amlodipine. Please f/u with pt.  °

## 2016-03-10 NOTE — Telephone Encounter (Signed)
Amlodipine refilled.

## 2016-03-24 ENCOUNTER — Ambulatory Visit: Payer: Medicaid Other | Attending: Family Medicine | Admitting: Family Medicine

## 2016-03-24 ENCOUNTER — Encounter: Payer: Self-pay | Admitting: Family Medicine

## 2016-03-24 DIAGNOSIS — S93402A Sprain of unspecified ligament of left ankle, initial encounter: Secondary | ICD-10-CM | POA: Insufficient documentation

## 2016-03-24 DIAGNOSIS — F329 Major depressive disorder, single episode, unspecified: Secondary | ICD-10-CM | POA: Diagnosis not present

## 2016-03-24 DIAGNOSIS — S93402D Sprain of unspecified ligament of left ankle, subsequent encounter: Secondary | ICD-10-CM | POA: Diagnosis not present

## 2016-03-24 DIAGNOSIS — F32A Depression, unspecified: Secondary | ICD-10-CM

## 2016-03-24 MED ORDER — BUPROPION HCL ER (SR) 150 MG PO TB12
ORAL_TABLET | ORAL | 2 refills | Status: DC
Start: 2016-03-24 — End: 2016-07-11

## 2016-03-24 MED ORDER — SERTRALINE HCL 100 MG PO TABS: ORAL_TABLET | ORAL | 0 refills | Status: DC

## 2016-03-24 MED FILL — NAPROXEN SODIUM 550 MG TAB: 550 | 15 days supply | Qty: 30 | Fill #1

## 2016-03-24 MED FILL — BIDIL TABLET: 20-37.5 | 30 days supply | Qty: 180 | Fill #1

## 2016-03-24 NOTE — Patient Instructions (Addendum)
Dionisio PaschalLennard was seen today for foot injury.  Diagnoses and all orders for this visit:  Morbid obesity due to excess calories (HCC) -     buPROPion (WELLBUTRIN SR) 150 MG 12 hr tablet; Take 150 mg daily for one week, then 150 mg twice daily  Depression -     buPROPion (WELLBUTRIN SR) 150 MG 12 hr tablet; Take 150 mg daily for one week, then 150 mg twice daily -     sertraline (ZOLOFT) 100 MG tablet; Take by mouth 50 mg daily for 1 week, then 25 mg daily for one week, then every other day for one week then STOP  Left ankle sprain, subsequent encounter  you may return to work on 03/28/16 Elevate leg while resting Compress with ace bandage during the day Ice when you return home for 15 mins with ice pac Take naproxen for inflammation   Take off zoloft as written Taper onto wellbutrin Making this change to help with weight loss   F/u in 4 weeks for depression and weight check   Dr. Armen PickupFunches

## 2016-03-24 NOTE — Progress Notes (Signed)
Subjective:  Patient ID: Rickey Calderon, male    DOB: Aug 30, 1965  Age: 50 y.o. MRN: [id number]  CC: Ankle Injury   HPI Rickey Calderon  Has HTN, OSA on CPAP, severe obesity, chronic low back and neck pain, chornic mixed diastolic and systolic CHF, CKD stage 3, depression he presents for   1. L ankle injury: he had twisting injury while at work on 03/07/2016. He has been out of work. He works in Systems developer for [employer]. Since it was an on the job injury he went to the employee physician. He reports lateral ankle pain that goes up to L lateral shin. Some swelling. He is able to bear weight. He request a return to work Physicist, medical. He was prescribed naproxen for pain and is getting the Rx filled.   2. Morbid obesity: at last OV he requested Rx for bontril. He would like help with weight loss. He has CHF. He takes zoloft for depression. He reports depression improved with his new job. He is amenable to switching antidepressants if a different one will help him lose weight.   Social History  Substance Use Topics  . Smoking status: Never Smoker  . Smokeless tobacco: Never Used  . Alcohol use No   Outpatient Medications Prior to Visit  Medication Sig Dispense Refill  . acetaminophen-codeine (TYLENOL #3) 300-30 MG tablet TAKE 1 TABLET BY MOUTH EVERY 8 HOURS AS NEEDED FOR MODERATE PAIN 90 tablet 2  . albuterol (PROVENTIL HFA;VENTOLIN HFA) 108 (90 Base) MCG/ACT inhaler Inhale 2 puffs into the lungs every 6 (six) hours as needed for wheezing or shortness of breath. 1 Inhaler 0  . allopurinol (ZYLOPRIM) 300 MG tablet TAKE 1 TABLET BY MOUTH DAILY 30 tablet 3  . amLODipine (NORVASC) 10 MG tablet Take 1 tablet (10 mg total) by mouth daily. 30 tablet 2  . cetirizine (ZYRTEC) 10 MG tablet TAKE 1 TABLET BY MOUTH DAILY. 30 tablet 2  . cyclobenzaprine (FLEXERIL) 10 MG tablet TAKE 1 TABLET BY MOUTH 3 TIMES DAILY AS NEEDED FOR MUSCLE SPASMS. 30 tablet 2  . Diclofenac Sodium 3 % GEL Place 1-2 g onto the skin 4 (four)  times daily as needed (neck pain). 100 g 2  . Fluticasone-Salmeterol (ADVAIR) 100-50 MCG/DOSE AEPB Inhale 1 puff into the lungs 2 (two) times daily. 60 each 2  . furosemide (LASIX) 40 MG tablet TAKE 1 TABLET BY MOUTH 2 TIMES DAILY. 60 tablet 2  . gabapentin (NEURONTIN) 300 MG capsule Take 1 capsule (300 mg total) by mouth 3 (three) times daily. 90 capsule 3  . isosorbide-hydrALAZINE (BIDIL) 20-37.5 MG tablet Take 2 tablets by mouth 3 (three) times daily. 180 tablet 11  . metoprolol (LOPRESSOR) 50 MG tablet Take 1 tablet (50 mg total) by mouth 2 (two) times daily. 60 tablet 3  . Potassium Chloride ER 20 MEQ TBCR TAKE 2 TABLETS BY MOUTH DAILY. 60 tablet 3  . sertraline (ZOLOFT) 100 MG tablet TAKE 1 TABLET BY MOUTH DAILY 30 tablet 2  . tamsulosin (FLOMAX) 0.4 MG CAPS capsule TAKE 1 CAPSULE BY MOUTH DAILY AFTER SUPPER. 30 capsule 2   No facility-administered medications prior to visit.     ROS Review of Systems  Constitutional: Negative for chills, fatigue, fever and unexpected weight change.  HENT: Negative for ear discharge and ear pain.   Eyes: Negative for visual disturbance.  Respiratory: Negative for cough and shortness of breath.   Cardiovascular: Negative for chest pain, palpitations and leg swelling.  Gastrointestinal: Negative for abdominal  pain, blood in stool, constipation, diarrhea, nausea and vomiting.  Endocrine: Negative for polydipsia, polyphagia and polyuria.  Musculoskeletal: Positive for arthralgias, back pain, joint swelling, neck pain and neck stiffness. Negative for gait problem and myalgias.  Skin: Negative for rash.  Allergic/Immunologic: Negative for immunocompromised state.  Neurological: Negative for dizziness, syncope, light-headedness and headaches.  Hematological: Negative for adenopathy. Does not bruise/bleed easily.  Psychiatric/Behavioral: Positive for dysphoric mood and sleep disturbance. Negative for suicidal ideas. The patient is not nervous/anxious.      Objective:  BP 123/84 (BP Location: Left Arm, Patient Position: Sitting, Cuff Size: Large)   Pulse 77   Temp 97.9 F (36.6 C) (Oral)   Resp 20   Wt (!) 373 lb (169.2 kg)   SpO2 95%   BMI 58.42 kg/m   BP/Weight 03/24/2016 02/19/2016 02/12/2016  Systolic BP 123 146 129  Diastolic BP 84 106 84  Wt. (Lbs) 373 373 373.2  BMI 58.42 58.42 60.24   Wt Readings from Last 3 Encounters:  03/24/16 (!) 373 lb (169.2 kg)  02/19/16 (!) 373 lb (169.2 kg)  02/12/16 (!) 373 lb 3.2 oz (169.3 kg)    Physical Exam  Constitutional: He appears well-developed and well-nourished. No distress.  Morbid obesity   HENT:  Head: Normocephalic and atraumatic.  Right Ear: Tympanic membrane, external ear and ear canal normal.  Left Ear: External ear and ear canal normal.  Cardiovascular: Normal rate, regular rhythm, normal heart sounds and intact distal pulses.   Pulmonary/Chest: Effort normal.  Musculoskeletal: He exhibits edema. Tenderness: 1+ b/l LE        Left ankle: He exhibits swelling. Tenderness. AITFL tenderness found. No lateral malleolus and no medial malleolus tenderness found.  Neurological: He is alert.  Skin: Skin is warm and dry. No rash noted. No erythema.  Psychiatric: He has a normal mood and affect.   Depression screen [facility] 2/9 03/24/2016 02/12/2016 02/12/2016  Decreased Interest 1 0 0  Down, Depressed, Hopeless 1 1 0  PHQ - 2 Score 2 1 0  Altered sleeping 1 1 -  Tired, decreased energy 0 0 -  Change in appetite 1 0 -  Feeling bad or failure about yourself  1 0 -  Trouble concentrating 0 1 -  Moving slowly or fidgety/restless 0 0 -  Suicidal thoughts 0 0 -  PHQ-9 Score 5 3 -   GAD 7 : Generalized Anxiety Score 03/24/2016 02/12/2016 12/11/2015 08/20/2015  Nervous, Anxious, on Edge 1 0 0 0  Control/stop worrying 0 1 1 0  Worry too much - different things 1 0 1 0  Trouble relaxing 0 0 1 0  Restless 0 0 0 0  Easily annoyed or irritable 1 1 1 1   Afraid - awful might happen 0 0 0 0   Total GAD 7 Score 3 2 4 1       Assessment & Plan:  Tannar was seen today for ankle injury.  Diagnoses and all orders for this visit:  Morbid obesity due to excess calories (HCC) -     buPROPion (WELLBUTRIN SR) 150 MG 12 hr tablet; Take 150 mg daily for one week, then 150 mg twice daily  Depression -     buPROPion (WELLBUTRIN SR) 150 MG 12 hr tablet; Take 150 mg daily for one week, then 150 mg twice daily -     sertraline (ZOLOFT) 100 MG tablet; Take by mouth 50 mg daily for 1 week, then 25 mg daily for one week, then every other  day for one week then STOP  Left ankle sprain, subsequent encounter   There are no diagnoses linked to this encounter.  No orders of the defined types were placed in this encounter.   Follow-up: Return in about 4 weeks (around 04/21/2016) for depression and weight check .   Dessa PhiJosalyn Shaconda Hajduk MD

## 2016-03-24 NOTE — Assessment & Plan Note (Signed)
A: left lateral ankle sprain, healing P: you may return to work on 03/28/16 Elevate leg while resting Compress with ace bandage during the day Ice when you return home for 15 mins with ice pac Take naproxen for inflammatio

## 2016-03-24 NOTE — Progress Notes (Signed)
Sprain ankle on 9/11 needs note to return to work.

## 2016-03-24 NOTE — Assessment & Plan Note (Signed)
Taper off zoloft as written Taper onto wellbutrin Making this change to help with weight loss

## 2016-03-31 MED FILL — BUPROPION SR 150 MG TABLET: 150 | 33 days supply | Qty: 60 | Fill #0

## 2016-04-07 MED FILL — ALLOPURINOL 300 MG TABLET: 300 | 30 days supply | Qty: 30 | Fill #3

## 2016-04-07 MED FILL — AMLODIPINE BESYLATE 10 MG T: 10 | 30 days supply | Qty: 30 | Fill #5

## 2016-04-07 MED FILL — METOPROLOL TARTRATE 50 MG T: 50 | 30 days supply | Qty: 60 | Fill #1

## 2016-04-07 MED FILL — GABAPENTIN 300 MG CAPSULE: 300 | 30 days supply | Qty: 90 | Fill #2

## 2016-04-07 MED FILL — TAMSULOSIN HCL 0.4 MG CAP: 0.4 | 30 days supply | Qty: 30 | Fill #1

## 2016-04-12 ENCOUNTER — Telehealth: Payer: Self-pay | Admitting: Cardiology

## 2016-04-12 ENCOUNTER — Ambulatory Visit: Payer: Medicaid Other | Admitting: Dietician

## 2016-04-12 NOTE — Telephone Encounter (Signed)
Closed Encounter  °

## 2016-04-13 ENCOUNTER — Other Ambulatory Visit (HOSPITAL_COMMUNITY): Payer: Medicaid Other

## 2016-04-18 ENCOUNTER — Encounter (HOSPITAL_COMMUNITY): Payer: Medicaid Other

## 2016-04-18 ENCOUNTER — Ambulatory Visit: Payer: Medicaid Other | Admitting: Surgery

## 2016-04-21 ENCOUNTER — Telehealth: Payer: Self-pay

## 2016-04-21 ENCOUNTER — Ambulatory Visit: Payer: Medicaid Other | Admitting: Cardiology

## 2016-04-21 NOTE — Telephone Encounter (Signed)
LMOM asking Pt. If he could either bring his SD card or CPAP to his appointment tomorrow

## 2016-04-22 ENCOUNTER — Ambulatory Visit (INDEPENDENT_AMBULATORY_CARE_PROVIDER_SITE_OTHER): Payer: Medicaid Other | Admitting: Pulmonary Disease

## 2016-04-22 ENCOUNTER — Telehealth: Payer: Self-pay

## 2016-04-22 ENCOUNTER — Encounter: Payer: Self-pay | Admitting: Pulmonary Disease

## 2016-04-22 VITALS — BP 132/92 | HR 73 | Ht 67.0 in | Wt 376.0 lb

## 2016-04-22 DIAGNOSIS — R0600 Dyspnea, unspecified: Secondary | ICD-10-CM

## 2016-04-22 DIAGNOSIS — R0609 Other forms of dyspnea: Secondary | ICD-10-CM | POA: Diagnosis not present

## 2016-04-22 DIAGNOSIS — Z23 Encounter for immunization: Secondary | ICD-10-CM | POA: Diagnosis not present

## 2016-04-22 DIAGNOSIS — E662 Morbid (severe) obesity with alveolar hypoventilation: Secondary | ICD-10-CM

## 2016-04-22 DIAGNOSIS — G4733 Obstructive sleep apnea (adult) (pediatric): Secondary | ICD-10-CM | POA: Diagnosis not present

## 2016-04-22 NOTE — Telephone Encounter (Signed)
[facility] might call back to send records of this pt.'s sleep study for Dr. Christene Slatese Dios.

## 2016-04-22 NOTE — Addendum Note (Signed)
Addended by: Garfield CorneaMABRY, JASMINE L on: 04/22/2016 02:20 PM   Modules accepted: Orders

## 2016-04-22 NOTE — Assessment & Plan Note (Addendum)
Nonsmoker. He has asthmatic bronchitis by history. Has congestive heart failure, systolic, EF 30%. Exertional dyspnea likely related to cardiac issues, morbid obesity, restrictive ventilatory defect. Needs PFT, ABG. PNA 23 on 03/2016. May need prevnar 13 in 03/2017 for CHF, ? CKD

## 2016-04-22 NOTE — Assessment & Plan Note (Signed)
Needs PFT, ABG

## 2016-04-22 NOTE — Assessment & Plan Note (Signed)
Weight reduction 

## 2016-04-22 NOTE — Patient Instructions (Signed)
It was a pleasure taking care of you today!  We will schedule you to have a sleep study to determine if you have sleep apnea.   We will get a lab sleep study.  You will be scheduled to have a lab sleep study in 4-6 weeks.  Someone from the sleep lab will call you in 2-3 days to schedule the study with you.  They usually have cancellations every night so most likely, they will have openings for a lab sleep study next week or so.  We encourage you to do your sleep study then if possible. Please give us a call in a week is no one from the sleep lab calls you in 2-3 days.   If the sleep study is positive, we will order you a CPAP  machine.  Please call the office if you do NOT receive your machine in the next 1-2 weeks.   Please make sure you use your CPAP device everytime you sleep.  We will monitor the usage of your machine per your insurance requirement.  Your insurance company may take the machine from you if you are not using it regularly.   Please clean the mask, tubings, filter, water reservoir with soapy water every week.  Please use distilled water for the water reservoir.   Please call the office or your machine provider (DME company) if you are having issues with the device.    We will get a breathing test and a blood gas.  Return to clinic in 6-8 weeks with Dr. Christene Slatese Dios or NP

## 2016-04-22 NOTE — Assessment & Plan Note (Signed)
Patient was diagnosed with OSA in 2009. Patient had snoring, gasping, choking, witnessed apneas. Had hypersomnia affecting his fxnality. Unsure severity. He was started on cpap and his sx improved. He ended up getting a new cpap machine roughly in 2015 in [place] via DME named [facility]. He currently has an autocpap 16-24 cm water. It was a refurbished machine since he could not afford a new machine. (Not sure why his insurance did not cover.  Pt states he has been on Medicaid).   He uses cpap. Feels better using it over all, still gets sleepy during daytime. Hypersomnia affects fxnality.   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 Gets sleepy in pm. He is looking into switching jobs with better schedule.   Occasional sleep talking.   ESS 11.   Pt has CHF, EF 30%, being seen by cardiology.   Plan :  We discussed about the diagnosis of Obstructive Sleep Apnea (OSA) and implications of untreated OSA. We discussed about CPAP and BiPaP as possible treatment options.    We will schedule the patient for a sleep study. Plan for a split-night sleep study. Patient is still symptomatic despite allegedly using his auto CPAP 16-24 centimeters water. He has morbid obesity. Also has congestive heart failure, EF 30%. Most likely with obesity hypoventilation syndrome. We will start off with doing a split-night study and go from there. He may end up needing BiPAP or AVAPS. Most likely will need oxygen with his PAP treatment 2/2 CHF. Will need a new DME here.   We called his DME in [place] and they're not sure if they can retrieve old records.   Patient was instructed to call the office if he/she has not heard back from the office 1-2 weeks after the sleep study.   Patient was instructed to call the office if he/she is having issues with the PAP device.   We discussed good sleep hygiene.   Patient was advised not to engage in activities requiring concentration and/or  vigilance if he/she is sleepy.  Patient was advised not to drive if he/she is sleepy.

## 2016-04-22 NOTE — Progress Notes (Signed)
Subjective:    Patient ID: Rickey Calderon, male    DOB: 1965/12/11, 50 y.o.   MRN: [id number]  HPI   This is the case of Rickey Calderon, 50 y.o. Male, who was referred by Dr. Dessa Phi in consultation regarding OSA.   As you very well know, patient is a non smoker, occasional asthmatic bronchitis.  Not been diagnosed with COPD.   Patient was diagnosed with OSA in 2009. Patient had snoring, gasping, choking, witnessed apneas. Had hypersomnia affecting his fxnality. Unsure severity. He was started on cpap and his sx improved. He ended up getting a new cpap machine roughly in 2015 in [place] via DME named [facility]. He currently has an autocpap 16-24 cm water.   He uses cpap. Feels better using it over all, still gets sleepy during daytime. Hypersomnia affects fxnality.   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 Gets sleepy in pm. He is looking into switching jobs with better schedule.   Occasional sleep talking.   ESS 11.   Pt has CHF, EF 30%, being seen by cardiology.    Review of Systems  Constitutional: Negative.  Negative for fever and unexpected weight change.  HENT: Positive for ear pain. Negative for congestion, dental problem, nosebleeds, postnasal drip, rhinorrhea, sinus pressure, sneezing, sore throat and trouble swallowing.   Eyes: Negative.  Negative for redness and itching.  Respiratory: Positive for wheezing. Negative for cough, chest tightness and shortness of breath.   Cardiovascular: Positive for leg swelling. Negative for palpitations.  Gastrointestinal: Negative.  Negative for nausea and vomiting.  Endocrine: Negative.   Genitourinary: Negative.  Negative for dysuria.  Musculoskeletal: Positive for joint swelling.  Skin: Negative.  Negative for rash.  Allergic/Immunologic: Positive for environmental allergies.  Neurological: Negative for headaches.  Hematological: Negative.  Does not bruise/bleed easily.    Psychiatric/Behavioral: Negative.  Negative for dysphoric mood. The patient is not nervous/anxious.    Past Medical History:  Diagnosis Date  . Asthma   . Bronchitis   . CHF (congestive heart failure) (HCC)    EF 30% by echo  . Depression   . Femoral thrombosis (HCC)   . Gout   . Hypertension   . Morbid obesity (HCC)   . Prostatitis   . 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   Family History  Problem Relation Age of Onset  . Diabetes Father   . Hyperlipidemia Father      Past Surgical History:  Procedure Laterality Date  . ABDOMINAL AORTAGRAM N/A 07/28/2014   Procedure: ABDOMINAL Ronny Flurry;  Surgeon: Chuck Hint, MD;  Location: [facility] CATH LAB;  Service: Cardiovascular;  Laterality: N/A;  . LOWER EXTREMITY ANGIOGRAM Bilateral 07/28/2014   Procedure: LOWER EXTREMITY ANGIOGRAM;  Surgeon: Chuck Hint, MD;  Location: [facility] CATH LAB;  Service: Cardiovascular;  Laterality: Bilateral;  . THROMBECTOMY FEMORAL ARTERY Right 08/01/2014   Procedure: THROMBECTOMY RIGHT LEG SFA, Anterior tibial, and perineal arteries with intraoperative arteriograms;  Surgeon: Nada Libman, MD;  Location: [facility] OR;  Service: Vascular;  Laterality: Right;    Social History   Social History  . Marital status: Married    Spouse name: N/A  . Number of children: N/A  . Years of education: N/A   Occupational History  . Not on file.   Social History Main Topics  . Smoking status: Never Smoker  . Smokeless  tobacco: Never Used  . Alcohol use No  . Drug use: No  . Sexual activity: Not on file   Other Topics Concern  . Not on file   Social History Narrative  . No narrative on file   Lives in [place].   No Known Allergies   Outpatient Medications Prior to Visit  Medication Sig Dispense Refill  . acetaminophen-codeine (TYLENOL #3) 300-30 MG tablet TAKE 1 TABLET BY MOUTH EVERY 8 HOURS AS NEEDED FOR MODERATE PAIN 90 tablet 2  . albuterol  (PROVENTIL HFA;VENTOLIN HFA) 108 (90 Base) MCG/ACT inhaler Inhale 2 puffs into the lungs every 6 (six) hours as needed for wheezing or shortness of breath. 1 Inhaler 0  . allopurinol (ZYLOPRIM) 300 MG tablet TAKE 1 TABLET BY MOUTH DAILY 30 tablet 3  . amLODipine (NORVASC) 10 MG tablet Take 1 tablet (10 mg total) by mouth daily. 30 tablet 2  . buPROPion (WELLBUTRIN SR) 150 MG 12 hr tablet Take 150 mg daily for one week, then 150 mg twice daily 60 tablet 2  . cetirizine (ZYRTEC) 10 MG tablet TAKE 1 TABLET BY MOUTH DAILY. 30 tablet 2  . cyclobenzaprine (FLEXERIL) 10 MG tablet TAKE 1 TABLET BY MOUTH 3 TIMES DAILY AS NEEDED FOR MUSCLE SPASMS. 30 tablet 2  . Diclofenac Sodium 3 % GEL Place 1-2 g onto the skin 4 (four) times daily as needed (neck pain). 100 g 2  . Fluticasone-Salmeterol (ADVAIR) 100-50 MCG/DOSE AEPB Inhale 1 puff into the lungs 2 (two) times daily. 60 each 2  . furosemide (LASIX) 40 MG tablet TAKE 1 TABLET BY MOUTH 2 TIMES DAILY. 60 tablet 2  . gabapentin (NEURONTIN) 300 MG capsule Take 1 capsule (300 mg total) by mouth 3 (three) times daily. 90 capsule 3  . isosorbide-hydrALAZINE (BIDIL) 20-37.5 MG tablet Take 2 tablets by mouth 3 (three) times daily. 180 tablet 11  . metoprolol (LOPRESSOR) 50 MG tablet Take 1 tablet (50 mg total) by mouth 2 (two) times daily. 60 tablet 3  . Potassium Chloride ER 20 MEQ TBCR TAKE 2 TABLETS BY MOUTH DAILY. 60 tablet 3  . tamsulosin (FLOMAX) 0.4 MG CAPS capsule TAKE 1 CAPSULE BY MOUTH DAILY AFTER SUPPER. 30 capsule 2  . sertraline (ZOLOFT) 100 MG tablet Take by mouth 50 mg daily for 1 week, then 25 mg daily for one week, then every other day for one week then STOP (Patient not taking: Reported on 04/22/2016) 30 tablet 0   No facility-administered medications prior to visit.    No orders of the defined types were placed in this encounter.       Objective:   Physical Exam  Vitals:  Vitals:   04/22/16 1049  BP: (!) 132/92  Pulse: 73  SpO2: 96%    Weight: (!) 376 lb (170.6 kg)  Height: 5\' 7"  (1.702 m)    Constitutional/General:  Pleasant, well-nourished, well-developed, not in any distress,  Comfortably seating.  Well kempt  Body mass index is 58.89 kg/m. Wt Readings from Last 3 Encounters:  04/22/16 (!) 376 lb (170.6 kg)  03/24/16 (!) 373 lb (169.2 kg)  02/19/16 (!) 373 lb (169.2 kg)    Neck circumference: 21 inches   HEENT: Pupils equal and reactive to light and accommodation. Anicteric sclerae. Normal nasal mucosa.   No oral  lesions,  mouth clear,  oropharynx clear, no postnasal drip. (-) Oral thrush. No dental caries.  Airway - Mallampati class IV  Neck: No masses. Midline trachea. No JVD, (-) LAD. (-)  bruits appreciated.  Respiratory/Chest: Grossly normal chest. (-) deformity. (-) Accessory muscle use.  Symmetric expansion. (-) Tenderness on palpation.  Resonant on percussion.  Diminished BS on both lower lung zones. (-) wheezing, crackles, rhonchi (-) egophony  Cardiovascular: Regular rate and  rhythm, heart sounds normal, no murmur or gallops, Gr 1 peripheral edema  Gastrointestinal:  Normal bowel sounds. Soft, non-tender. No hepatosplenomegaly.  (-) masses.   Musculoskeletal:  Normal muscle tone. Normal gait.   Extremities: Grossly normal. (-) clubbing, cyanosis.  Gr 1 5 edema  Skin: (-) rash,lesions seen.   Neurological/Psychiatric : alert, oriented to time, place, person. Normal mood and affect          Assessment & Plan:  OSA (obstructive sleep apnea) Patient was diagnosed with OSA in 2009. Patient had snoring, gasping, choking, witnessed apneas. Had hypersomnia affecting his fxnality. Unsure severity. He was started on cpap and his sx improved. He ended up getting a new cpap machine roughly in 2015 in [place] via DME named [facility]. He currently has an autocpap 16-24 cm water. It was a refurbished machine since he could not afford a new machine. (Not sure why his insurance did not  cover.  Pt states he has been on Medicaid).   He uses cpap. Feels better using it over all, still gets sleepy during daytime. Hypersomnia affects fxnality.   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 Gets sleepy in pm. He is looking into switching jobs with better schedule.   Occasional sleep talking.   ESS 11.   Pt has CHF, EF 30%, being seen by cardiology.   Plan :  We discussed about the diagnosis of Obstructive Sleep Apnea (OSA) and implications of untreated OSA. We discussed about CPAP and BiPaP as possible treatment options.    We will schedule the patient for a sleep study. Plan for a split-night sleep study. Patient is still symptomatic despite allegedly using his auto CPAP 16-24 centimeters water. He has morbid obesity. Also has congestive heart failure, EF 30%. Most likely with obesity hypoventilation syndrome. We will start off with doing a split-night study and go from there. He may end up needing BiPAP or AVAPS. Most likely will need oxygen with his PAP treatment 2/2 CHF. Will need a new DME here.   We called his DME in [place] and they're not sure if they can retrieve old records.   Patient was instructed to call the office if he/she has not heard back from the office 1-2 weeks after the sleep study.   Patient was instructed to call the office if he/she is having issues with the PAP device.   We discussed good sleep hygiene.   Patient was advised not to engage in activities requiring concentration and/or vigilance if he/she is sleepy.  Patient was advised not to drive if he/she is sleepy.    Morbid obesity (HCC) Weight reduction  Exertional dyspnea Nonsmoker. He has asthmatic bronchitis by history. Has congestive heart failure, systolic, EF 30%. Exertional dyspnea likely related to cardiac issues, morbid obesity, restrictive ventilatory defect. Needs PFT, ABG. PNA 23 on 03/2016. Needs prevnar 13 in 03/2017   Obesity hypoventilation  syndrome (HCC) Needs PFT, ABG    Thank you very much for letting me participate in this patient's care. Please do not hesitate to give me a call if you have any questions or concerns regarding the treatment plan.   Patient will follow up with me in 6-8 weeks  Pollie MeyerJ. Angelo A. de Dios, MD 04/22/2016   11:51 AM Pulmonary and Critical Care Medicine [facility] Pager: [phone] Office: [phone], Fax: [phone]

## 2016-04-29 ENCOUNTER — Other Ambulatory Visit: Payer: Self-pay

## 2016-04-29 ENCOUNTER — Encounter (HOSPITAL_COMMUNITY): Payer: Medicaid Other

## 2016-04-29 ENCOUNTER — Ambulatory Visit (HOSPITAL_COMMUNITY): Payer: Medicaid Other | Attending: Cardiovascular Disease

## 2016-04-29 DIAGNOSIS — I429 Cardiomyopathy, unspecified: Secondary | ICD-10-CM | POA: Diagnosis not present

## 2016-05-03 ENCOUNTER — Encounter: Payer: Self-pay | Admitting: Cardiology

## 2016-05-05 ENCOUNTER — Other Ambulatory Visit: Payer: Self-pay | Admitting: Family Medicine

## 2016-05-05 MED FILL — BUPROPION SR 150 MG TABLET: 150 | 33 days supply | Qty: 60 | Fill #1

## 2016-05-05 MED FILL — GABAPENTIN 300 MG CAPSULE: 300 | 30 days supply | Qty: 90 | Fill #3

## 2016-05-05 MED FILL — TAMSULOSIN HCL 0.4 MG CAP: 0.4 | 30 days supply | Qty: 30 | Fill #2

## 2016-05-05 MED FILL — ALLOPURINOL 300 MG TABLET: 300 | 30 days supply | Qty: 30 | Fill #0

## 2016-05-05 MED FILL — METOPROLOL TARTRATE 50 MG T: 50 | 30 days supply | Qty: 60 | Fill #2

## 2016-05-06 ENCOUNTER — Ambulatory Visit (HOSPITAL_COMMUNITY)
Admission: RE | Admit: 2016-05-06 | Discharge: 2016-05-06 | Disposition: A | Discharge status: Home / Self Care | Payer: Medicaid Other | Source: Ambulatory Visit | Attending: Pulmonary Disease | Admitting: Pulmonary Disease

## 2016-05-06 ENCOUNTER — Telehealth: Payer: Self-pay | Admitting: Pulmonary Disease

## 2016-05-06 DIAGNOSIS — R0600 Dyspnea, unspecified: Secondary | ICD-10-CM | POA: Diagnosis not present

## 2016-05-06 LAB — PULMONARY FUNCTION TEST
DL/VA % pred: 130 %
DL/VA: 5.79 ml/min/mmHg/L
DLCO COR % PRED: 88 %
DLCO COR: 25.09 ml/min/mmHg
DLCO unc % pred: 80 %
DLCO unc: 22.68 ml/min/mmHg
FEF 25-75 POST: 1.71 L/s
FEF 25-75 Pre: 1.28 L/sec
FEF2575-%CHANGE-POST: 33 %
FEF2575-%PRED-POST: 56 %
FEF2575-%PRED-PRE: 42 %
FEV1-%CHANGE-POST: 8 %
FEV1-%Pred-Post: 70 %
FEV1-%Pred-Pre: 65 %
FEV1-POST: 2.1 L
FEV1-Pre: 1.95 L
FEV1FVC-%CHANGE-POST: 11 %
FEV1FVC-%PRED-PRE: 88 %
FEV6-%Change-Post: 0 %
FEV6-%Pred-Post: 72 %
FEV6-%Pred-Pre: 73 %
FEV6-Post: 2.64 L
FEV6-Pre: 2.67 L
FEV6FVC-%Change-Post: 2 %
FEV6FVC-%PRED-POST: 103 %
FEV6FVC-%Pred-Pre: 101 %
FVC-%CHANGE-POST: -3 %
FVC-%PRED-POST: 70 %
FVC-%PRED-PRE: 73 %
FVC-POST: 2.65 L
FVC-PRE: 2.74 L
POST FEV1/FVC RATIO: 80 %
PRE FEV1/FVC RATIO: 71 %
PRE FEV6/FVC RATIO: 98 %
Post FEV6/FVC ratio: 100 %
RV % pred: 110 %
RV: 2.08 L
TLC % PRED: 77 %
TLC: 4.95 L

## 2016-05-06 LAB — BLOOD GAS, ARTERIAL
Acid-Base Excess: 3.6 mmol/L — ABNORMAL HIGH (ref 0.0–2.0)
Bicarbonate: 28.1 mmol/L — ABNORMAL HIGH (ref 20.0–28.0)
DRAWN BY: 244901
FIO2: 21
O2 Saturation: 93.3 %
PH ART: 7.403 (ref 7.350–7.450)
Patient temperature: 98.6
pCO2 arterial: 46 mmHg (ref 32.0–48.0)
pO2, Arterial: 69.8 mmHg — ABNORMAL LOW (ref 83.0–108.0)

## 2016-05-06 MED ORDER — ALBUTEROL SULFATE (2.5 MG/3ML) 0.083% IN NEBU
2.5000 mg | INHALATION_SOLUTION | Freq: Once | RESPIRATORY_TRACT | Status: AC
  Administered 2016-05-06: 2.5 mg via RESPIRATORY_TRACT

## 2016-05-06 NOTE — Telephone Encounter (Signed)
   Results of cpap study done on 07/30/13 obtained. Suboptimal study. At cpap 16 cm water, AHI was 20. He was placed on autocpap 16-20 based on report.   Pt is to get a split night study. Likely will need Bipap.  Pollie MeyerJ. Angelo A de Dios, MD 05/06/2016, 7:43 AM [facility] Pager [phone] After 3 pm or if no answer, call [phone]

## 2016-05-10 ENCOUNTER — Ambulatory Visit: Payer: Medicaid Other | Admitting: Cardiology

## 2016-05-11 ENCOUNTER — Telehealth: Payer: Self-pay

## 2016-05-11 MED FILL — POTASSIUM CL ER 20 MEQ TAB: 20 | 30 days supply | Qty: 60 | Fill #0

## 2016-05-11 MED FILL — AMLODIPINE BESYLATE 10 MG T: 10 | 30 days supply | Qty: 30 | Fill #0

## 2016-05-11 MED FILL — FUROSEMIDE 20 MG TABLET: 20 | 30 days supply | Qty: 120 | Fill #2

## 2016-05-11 MED FILL — BIDIL TABLET: 20-37.5 | 30 days supply | Qty: 180 | Fill #2

## 2016-05-11 MED FILL — VOLTAREN 1% GEL: 1 | 17 days supply | Qty: 100 | Fill #0

## 2016-05-11 NOTE — Telephone Encounter (Signed)
error 

## 2016-05-11 NOTE — Progress Notes (Signed)
LMTCB for pt and emailed to PCP-[facility]CHES, Ellison CarwinJOSALYN C MD

## 2016-05-12 ENCOUNTER — Telehealth: Payer: Self-pay | Admitting: Family Medicine

## 2016-05-12 ENCOUNTER — Telehealth: Payer: Self-pay

## 2016-05-12 ENCOUNTER — Telehealth: Payer: Self-pay | Admitting: Pulmonary Disease

## 2016-05-12 DIAGNOSIS — M542 Cervicalgia: Secondary | ICD-10-CM

## 2016-05-12 DIAGNOSIS — M545 Low back pain: Principal | ICD-10-CM

## 2016-05-12 DIAGNOSIS — G8929 Other chronic pain: Secondary | ICD-10-CM

## 2016-05-12 MED ORDER — CYCLOBENZAPRINE HCL 10 MG PO TABS: ORAL_TABLET | ORAL | 0 refills | Status: DC

## 2016-05-12 MED ORDER — ACETAMINOPHEN-CODEINE #3 300-30 MG PO TABS: ORAL_TABLET | ORAL | 2 refills | Status: DC

## 2016-05-12 MED FILL — CYCLOBENZAPRINE 10 MG TAB: 10 | 10 days supply | Qty: 30 | Fill #0

## 2016-05-12 NOTE — Telephone Encounter (Signed)
Spoke with the pt  He is calling about PFT results, states he was confused about the call from yesterday  I notified him of PFT results per AD's result note  He verbalized understanding  He has Advair, but has only been using on a prn basis and was not aware this was a maintenance inhaler  He is now asking if AD thinks he should begin using advair bid, or try stiolto inhaler  Please advise thanks!

## 2016-05-12 NOTE — Telephone Encounter (Signed)
Patient called the office to request medication refill for cyclobenzaprine (FLEXERIL) 10 MG tablet. Please call prescription to our pharmacy [facility]  Thank you.

## 2016-05-12 NOTE — Telephone Encounter (Signed)
Cyclobenzaprine refilled.

## 2016-05-12 NOTE — Telephone Encounter (Signed)
Please inform patient tylenol #3 ready for pick up  

## 2016-05-12 NOTE — Telephone Encounter (Signed)
Patient called to request medical refill for acetaminophen-codeine (TYLENOL #3) 300-30 MG tablet.   Thank you.

## 2016-05-12 NOTE — Telephone Encounter (Signed)
LMOM TCB   Pt. Was schedule to have a split night on 12/18. AD wanted to get him to get it sooner. I just want to verify with pt. That this is ok with him if we try to get him a sooner appointment.

## 2016-05-13 ENCOUNTER — Telehealth: Payer: Self-pay

## 2016-05-13 ENCOUNTER — Encounter: Payer: Self-pay | Admitting: *Deleted

## 2016-05-13 NOTE — Telephone Encounter (Signed)
LMOM TCB  Just was following up with pt. That he was aware of his sleep lab study on 12/15

## 2016-05-16 MED ORDER — FLUTICASONE-SALMETEROL 100-50 MCG/DOSE IN AEPB: 1.0000 | INHALATION_SPRAY | Freq: Two times a day (BID) | RESPIRATORY_TRACT | 2 refills | Status: DC

## 2016-05-16 MED FILL — ADVAIR 100/50 DISKUS: 100-50 | 30 days supply | Qty: 60 | Fill #0

## 2016-05-16 MED FILL — ACETAMINOPHEN/COD #3 TABLET: 300-30 | 30 days supply | Qty: 90 | Fill #0

## 2016-05-16 NOTE — Telephone Encounter (Signed)
Pt aware of AD recommendations. Pt request refill on advair, Rx has been sent to preferred pharmacy. Pt aware and voiced understanding. Nothing further needed.

## 2016-05-16 NOTE — Telephone Encounter (Signed)
If he has advair, I suggest he tries it 2x/d daily for 2-4 weeks and see if it makes a difference with his breathing.  If it does, I suggest to continue with it. If not, he can hold off. Thanks.  Pollie MeyerJ. Angelo A de Dios, MD 05/16/2016, 1:19 AM [facility] Pager [phone] After 3 pm or if no answer, call [phone]

## 2016-05-17 ENCOUNTER — Ambulatory Visit (INDEPENDENT_AMBULATORY_CARE_PROVIDER_SITE_OTHER): Payer: Medicaid Other | Admitting: Cardiology

## 2016-05-17 ENCOUNTER — Encounter: Payer: Self-pay | Admitting: Cardiology

## 2016-05-17 VITALS — BP 152/94 | HR 76 | Ht 67.0 in | Wt 366.6 lb

## 2016-05-17 DIAGNOSIS — G4730 Sleep apnea, unspecified: Secondary | ICD-10-CM

## 2016-05-17 DIAGNOSIS — I429 Cardiomyopathy, unspecified: Secondary | ICD-10-CM | POA: Insufficient documentation

## 2016-05-17 DIAGNOSIS — N183 Chronic kidney disease, stage 3 unspecified: Secondary | ICD-10-CM

## 2016-05-17 DIAGNOSIS — I743 Embolism and thrombosis of arteries of the lower extremities: Secondary | ICD-10-CM | POA: Insufficient documentation

## 2016-05-17 DIAGNOSIS — I119 Hypertensive heart disease without heart failure: Secondary | ICD-10-CM

## 2016-05-17 DIAGNOSIS — I43 Cardiomyopathy in diseases classified elsewhere: Secondary | ICD-10-CM

## 2016-05-17 DIAGNOSIS — J4520 Mild intermittent asthma, uncomplicated: Secondary | ICD-10-CM

## 2016-05-17 DIAGNOSIS — E669 Obesity, unspecified: Secondary | ICD-10-CM | POA: Insufficient documentation

## 2016-05-17 DIAGNOSIS — I420 Dilated cardiomyopathy: Secondary | ICD-10-CM

## 2016-05-17 DIAGNOSIS — G4733 Obstructive sleep apnea (adult) (pediatric): Secondary | ICD-10-CM

## 2016-05-17 DIAGNOSIS — I10 Essential (primary) hypertension: Secondary | ICD-10-CM

## 2016-05-17 MED ORDER — POTASSIUM CHLORIDE ER 20 MEQ PO TBCR: 1.0000 | EXTENDED_RELEASE_TABLET | Freq: Every day | ORAL | 1 refills | Status: DC

## 2016-05-17 MED ORDER — FUROSEMIDE 40 MG PO TABS
40.0000 mg | ORAL_TABLET | Freq: Every day | ORAL | 1 refills | Status: DC
Start: 2016-05-17 — End: 2016-11-01

## 2016-05-17 NOTE — Progress Notes (Signed)
05/17/2016 Rickey Calderon   11-Nov-1965  [id number]  Primary Physician Rickey Calderon Primary Cardiologist: Dr Rickey Calderon  HPI:  50 y/o obese (BMI 2657) AA male, who works the night shift as a Arboriculturistcustodian at [employer]. He has a history of RLE arterial embolism in Feb 2016, s/p embolectomy then. Hypercoagulability work up unremarkable and he was taken off oral anticoagulants. He was seen in the ED in March 2017 with CHF. Echo showed an EF of 25%. He ws placed on medical Rx and is seen today after a follow up echo. The pt reports no CHF symptoms. He admits he is not taking Lasix on a regular basis secondary to frequent urination. He was taken of an ARB previously secondary to renal insufficiency, he is on BiDil. Fortunately his echo 04/29/16 showed improvement in his LVF to 45-50% with no WMA and grade 2 DD. He denies any chest pain.  He is being followed by Dr Rickey Calderon for asthma and sleep apnea. He is scheduled to have another sleep study to see if he needs BiPap, he is on C-pap now.    Current Outpatient Prescriptions  Medication Sig Dispense Refill  . acetaminophen-codeine (TYLENOL #3) 300-30 MG tablet TAKE 1 TABLET BY MOUTH EVERY 8 HOURS AS NEEDED FOR MODERATE PAIN 90 tablet 2  . albuterol (PROVENTIL HFA;VENTOLIN HFA) 108 (90 Base) MCG/ACT inhaler Inhale 2 puffs into the lungs every 6 (six) hours as needed for wheezing or shortness of breath. 1 Inhaler 0  . allopurinol (ZYLOPRIM) 300 MG tablet TAKE 1 TABLET BY MOUTH DAILY 30 tablet 3  . amLODipine (NORVASC) 10 MG tablet Take 1 tablet (10 mg total) by mouth daily. 30 tablet 2  . buPROPion (WELLBUTRIN SR) 150 MG 12 hr tablet Take 150 mg daily for one week, then 150 mg twice daily 60 tablet 2  . cetirizine (ZYRTEC) 10 MG tablet TAKE 1 TABLET BY MOUTH DAILY. 30 tablet 2  . cyclobenzaprine (FLEXERIL) 10 MG tablet TAKE 1 TABLET BY MOUTH 3 TIMES DAILY AS NEEDED FOR MUSCLE SPASMS. 30 tablet 0  . Diclofenac Sodium 3 % GEL Place 1-2 g onto the skin 4  (four) times daily as needed (neck pain). 100 g 2  . Fluticasone-Salmeterol (ADVAIR) 100-50 MCG/DOSE AEPB Inhale 1 puff into the lungs 2 (two) times daily. 60 each 2  . furosemide (LASIX) 40 MG tablet Take 1 tablet (40 mg total) by mouth daily. 90 tablet 1  . gabapentin (NEURONTIN) 300 MG capsule Take 1 capsule (300 mg total) by mouth 3 (three) times daily. 90 capsule 3  . isosorbide-hydrALAZINE (BIDIL) 20-37.5 MG tablet Take 2 tablets by mouth 3 (three) times daily. 180 tablet 11  . metoprolol (LOPRESSOR) 50 MG tablet Take 1 tablet (50 mg total) by mouth 2 (two) times daily. 60 tablet 3  . Potassium Chloride ER 20 MEQ TBCR Take 1 tablet by mouth daily. 90 tablet 1  . tamsulosin (FLOMAX) 0.4 MG CAPS capsule TAKE 1 CAPSULE BY MOUTH DAILY AFTER SUPPER. 30 capsule 2   No current facility-administered medications for this visit.     No Known Allergies  Social History   Social History  . Marital status: Married    Spouse name: N/A  . Number of children: N/A  . Years of education: N/A   Occupational History  . Not on file.   Social History Main Topics  . Smoking status: Never Smoker  . Smokeless tobacco: Never Used  . Alcohol use No  . Drug  use: No  . Sexual activity: Not on file   Other Topics Concern  . Not on file   Social History Narrative  . No narrative on file     Review of Systems: General: negative for chills, fever, night sweats or weight changes.  Cardiovascular: negative for chest pain, dyspnea on exertion,orthopnea, palpitations, paroxysmal nocturnal dyspnea or shortness of breath Dermatological: negative for rash Respiratory: negative for cough or wheezing Urologic: negative for hematuria Abdominal: negative for nausea, vomiting, diarrhea, bright red blood per rectum, melena, or hematemesis Neurologic: negative for visual changes, syncope, or dizziness All other systems reviewed and are otherwise negative except as noted above.    Blood pressure (!) 152/94,  pulse 76, height 5\' 7"  (1.702 m), weight (!) 366 lb 9.6 oz (166.3 kg).  General appearance: alert, cooperative, no distress and moderately obese Lungs: clear to auscultation bilaterally Heart: regular rate and rhythm Extremities: trace LE edema Skin: Skin color, texture, turgor normal. No rashes or lesions Neurologic: Grossly normal    ASSESSMENT AND PLAN:   Cardiomyopathy (HCC) Etiology not determined, presumed to be HTN. 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 Coag w/u negative, anticoagulation (Xarelto) stopped   PLAN  He is not taking his Lasix on a regular basis. I suggested he take this once a day with K+. Dr Rickey Calderon had mentioned an ischemic work up for his cardiomyopathy but I'm not sure this is still indicated as his EF improved. He has no WMA on echo. Doubt a Myoview would be useful with his super morbid obesity. F/U Dr Rickey Calderon in 3 months- BMP then. Long discussion with patient about medication compliance and low salt diet.   Corine ShelterLuke Yasmeen Manka PA-C 05/17/2016 10:15 AM

## 2016-05-17 NOTE — Assessment & Plan Note (Signed)
Super obesity- BMI 58

## 2016-05-17 NOTE — Assessment & Plan Note (Signed)
Last GFR was >70- off ARB, on BiDil

## 2016-05-17 NOTE — Assessment & Plan Note (Signed)
C-pap, followed by Dr Christene Slatese Dios, he may need BIPap

## 2016-05-17 NOTE — Assessment & Plan Note (Signed)
Grade 2 DD on echo 

## 2016-05-17 NOTE — Assessment & Plan Note (Signed)
BP elevated. 

## 2016-05-17 NOTE — Assessment & Plan Note (Signed)
Feb 2016- s/p Rt LE embolectomy. Coag w/u negative, anticoagulation (Xarelto) stopped

## 2016-05-17 NOTE — Patient Instructions (Addendum)
Medication Instructions:  Your physician recommends that you continue on your current medications as directed. Please refer to the Current Medication list given to you today. START TAKING LASIX 40 MG EVERYDAY ALONG WITH THE POTASSIUM 20 MEQ EVERYDAY   Labwork: None ordered  Testing/Procedures: None ordered  Follow-Up: Your physician recommends that you schedule a follow-up appointment in: 3 MONTHS WITH DR. HOCHREIN    Any Other Special Instructions Will Be Listed Below (If Applicable).   If you need a refill on your cardiac medications before your next appointment, please call your pharmacy.

## 2016-05-17 NOTE — Assessment & Plan Note (Signed)
Etiology not determined, presumed to be HTN. EF improved to 45-50% by echo Nov 2017

## 2016-05-23 ENCOUNTER — Other Ambulatory Visit: Payer: Self-pay | Admitting: Family Medicine

## 2016-05-23 DIAGNOSIS — I10 Essential (primary) hypertension: Secondary | ICD-10-CM

## 2016-06-10 ENCOUNTER — Other Ambulatory Visit: Payer: Self-pay | Admitting: Family Medicine

## 2016-06-10 ENCOUNTER — Ambulatory Visit (HOSPITAL_BASED_OUTPATIENT_CLINIC_OR_DEPARTMENT_OTHER): Payer: Medicaid Other | Attending: Pulmonary Disease | Admitting: Pulmonary Disease

## 2016-06-10 VITALS — Ht 67.0 in | Wt 355.0 lb

## 2016-06-10 DIAGNOSIS — G8929 Other chronic pain: Secondary | ICD-10-CM

## 2016-06-10 DIAGNOSIS — M545 Low back pain: Principal | ICD-10-CM

## 2016-06-10 DIAGNOSIS — M542 Cervicalgia: Secondary | ICD-10-CM

## 2016-06-10 DIAGNOSIS — G4733 Obstructive sleep apnea (adult) (pediatric): Secondary | ICD-10-CM | POA: Diagnosis present

## 2016-06-10 DIAGNOSIS — G4730 Sleep apnea, unspecified: Secondary | ICD-10-CM

## 2016-06-10 MED ORDER — CETIRIZINE HCL 10 MG PO TABS: 10.0000 mg | ORAL_TABLET | Freq: Every day | ORAL | 2 refills | Status: DC

## 2016-06-10 MED FILL — METOPROLOL TARTRATE 50 MG T: 50 | 30 days supply | Qty: 60 | Fill #3

## 2016-06-10 MED FILL — GABAPENTIN 300 MG CAPSULE: 300 | 30 days supply | Qty: 90 | Fill #0

## 2016-06-10 MED FILL — BUPROPION SR 150 MG TABLET: 150 | 33 days supply | Qty: 60 | Fill #2

## 2016-06-10 MED FILL — BIDIL TABLET: 20-37.5 | 30 days supply | Qty: 180 | Fill #3

## 2016-06-10 MED FILL — CYCLOBENZAPRINE 10 MG TAB: 10 | 10 days supply | Qty: 30 | Fill #0

## 2016-06-10 MED FILL — TAMSULOSIN HCL 0.4 MG CAP: 0.4 | 30 days supply | Qty: 30 | Fill #0

## 2016-06-10 MED FILL — ALL DAY ALLERGY 10 MG TAB: 10 | 30 days supply | Qty: 30 | Fill #0

## 2016-06-10 NOTE — Telephone Encounter (Signed)
Pt. Called requesting a refill on the following medication:   gabapentin (NEURONTIN) 300 MG capsule   cetirizine (ZYRTEC) 10 MG tablet  cyclobenzaprine (FLEXERIL) 10 MG tablet   Please f/u

## 2016-06-10 NOTE — Telephone Encounter (Signed)
Refilled gabapentin, flexeril, zyrtec

## 2016-06-13 ENCOUNTER — Ambulatory Visit (HOSPITAL_BASED_OUTPATIENT_CLINIC_OR_DEPARTMENT_OTHER): Payer: Medicaid Other

## 2016-06-16 MED FILL — ALLOPURINOL 300 MG TABLET: 300 | 30 days supply | Qty: 30 | Fill #1

## 2016-06-16 MED FILL — ACETAMINOPHEN/COD #3 TABLET: 300-30 | 30 days supply | Qty: 90 | Fill #1

## 2016-06-22 MED FILL — AMLODIPINE BESYLATE 10 MG T: 10 | 30 days supply | Qty: 30 | Fill #1

## 2016-06-24 ENCOUNTER — Ambulatory Visit: Payer: Medicaid Other | Admitting: Pulmonary Disease

## 2016-06-25 ENCOUNTER — Telehealth: Payer: Self-pay | Admitting: Pulmonary Disease

## 2016-06-25 DIAGNOSIS — G4733 Obstructive sleep apnea (adult) (pediatric): Secondary | ICD-10-CM

## 2016-06-25 DIAGNOSIS — G4730 Sleep apnea, unspecified: Secondary | ICD-10-CM | POA: Diagnosis not present

## 2016-06-25 NOTE — Procedures (Signed)
    NAME: Rickey MazeLennard Caplin DATE OF BIRTH:  12-10-1965 MEDICAL RECORD NUMBER [id number]  LOCATION: [facility]  PHYSICIAN: Daneen SchickJose Angelo A De Dios  DATE OF STUDY: 06/10/2016  CLINICAL INFORMATION  The patient is referred for a BiPAP titration to treat sleep apnea. Date of NPSG, Split Night or HST:   SLEEP STUDY TECHNIQUE  As per the AASM Manual for the Scoring of Sleep and Associated Events v2.3 (April 2016) with a hypopnea requiring 4% desaturations.  The channels recorded and monitored were frontal, central and occipital EEG, electrooculogram (EOG), submentalis EMG (chin), nasal and oral airflow, thoracic and abdominal wall motion, anterior tibialis EMG, snore microphone, electrocardiogram, and pulse oximetry. Bilevel positive airway pressure (BPAP) was initiated at the beginning of the study and titrated to treat sleep-disordered breathing.  MEDICATIONS  Medications self-administered by patient taken the night of the study : GABAPENTIN, ACETAMINOPHEN, METOPROLOL, WELLBUTRIN, TRIDIL, ALLOPURINOL, FLOMAX, ADVAIR DISKUS   RESPIRATORY PARAMETERS  Optimal IPAP Pressure (cm): 18 AHI at Optimal Pressure (/hr) 7.7  Optimal EPAP Pressure (cm): 14    Overall Minimal O2 (%): 79.00 Minimal O2 at Optimal Pressure (%): 89.0  SLEEP ARCHITECTURE  Start Time: 10:56:29 PM Stop Time: 4:53:34 AM Total Time (min): 357.1 Total Sleep Time (min): 256.0  Sleep Latency (min): 2.5 Sleep Efficiency (%): 71.7 REM Latency (min): 27.5 WASO (min): 98.6  Stage N1 (%): 13.48 Stage N2 (%): 68.16 Stage N3 (%): 0.00 Stage R (%): 18.36  Supine (%): 81.44 Arousal Index (/hr): 37.3      CARDIAC DATA  The 2 lead EKG demonstrated sinus rhythm. The mean heart rate was 60.96 beats per minute. Other EKG findings include: None.   LEG MOVEMENT DATA  The total Periodic Limb Movements of Sleep (PLMS) were 17. The PLMS index was 3.98. A PLMS index of <15 is considered normal in adults.  IMPRESSIONS  1. During the  diagnostic portion of the study, AHI was 63. 2. Central sleep apnea was not noted during this titration (CAI = 0.0/h). 3. Severe oxygen desaturations were observed during this titration (min O2 = 79.00%). 4. The patient snored with Soft snoring volume. 5. No cardiac abnormalities were observed during this study. 6. Clinically significant periodic limb movements were not noted during this study. Arousals associated with PLMs were rare.   DIAGNOSIS  Obstructive Sleep Apnea (327.23 [G47.33 ICD-10]), severe with an AHI of 63.   RECOMMENDATIONS  1. No optimal CPAP or BipaP settings were obtained during this study. On BipaP 18/14 cm water, patient's AHI was 7.7 but he did not have significant REM sleep. 2. Suggest trial with autoBiPAP therapy with IPAP 15-20 cm water, EPAP 10-15 cm water with a Large size Resmed Full Face Mask AirFit F20 mask and heated humidification. Patient will need a download to determine BipaP efficacy. 3. Avoid alcohol, sedatives and other CNS depressants that may worsen sleep apnea and disrupt normal sleep architecture. 4. Sleep hygiene should be reviewed to assess factors that may improve sleep quality. 5. Weight management and regular exercise should be initiated or continued. 6. Return to the office 4-6 weeks after obtaining BiPaP machine.   Pollie MeyerJ. Angelo A de Dios, MD 06/25/2016, 6:20 AM [facility] Pager [phone] After 3 pm or if no answer, call [phone]

## 2016-06-25 NOTE — Telephone Encounter (Signed)
    Please call the pt and tell the pt the LAB SLEEP STUDY  showed severe OSA.   Pt stops breathing  63  times an hour.   Please order autoBiPAP therapy with IPAP 15-20 cm water, EPAP 10-15 cm water with a Large size Resmed Full Face Mask AirFit F20 mask and heated humidification. Patient will need a download to determine BipaP efficacy.   Patient needs to be seen by me or any of the NPs/APPs  4-6 weeks after obtaining the cpap machine. Let me know if you receive this.   Thanks!   J. Alexis FrockAngelo A de Dios, MD 06/25/2016, 6:25 AM

## 2016-06-28 NOTE — Telephone Encounter (Signed)
lmomtcb x1 

## 2016-06-28 NOTE — Telephone Encounter (Signed)
Spoke with pt. And gave him his sleep study results per AD. Pt. Agreed to the order being placed. The order was placed. Pt. Is aware to call us once he has received his machine so that we can get a follow up appointment. Nothing further is needed at this time.

## 2016-07-11 ENCOUNTER — Other Ambulatory Visit: Payer: Self-pay | Admitting: Family Medicine

## 2016-07-11 DIAGNOSIS — F329 Major depressive disorder, single episode, unspecified: Secondary | ICD-10-CM

## 2016-07-11 DIAGNOSIS — F32A Depression, unspecified: Secondary | ICD-10-CM

## 2016-07-11 MED FILL — GABAPENTIN 300 MG CAPSULE: 300 | 30 days supply | Qty: 90 | Fill #1

## 2016-07-11 MED FILL — TAMSULOSIN HCL 0.4 MG CAP: 0.4 | 30 days supply | Qty: 30 | Fill #1

## 2016-07-11 MED FILL — BUPROPION SR 150 MG TABLET: 150 | 30 days supply | Qty: 60 | Fill #0

## 2016-07-11 MED FILL — ALL DAY ALLERGY 10 MG TAB: 10 | 30 days supply | Qty: 30 | Fill #1

## 2016-07-20 ENCOUNTER — Other Ambulatory Visit: Payer: Self-pay | Admitting: Family Medicine

## 2016-07-20 ENCOUNTER — Telehealth: Payer: Self-pay | Admitting: Family Medicine

## 2016-07-20 DIAGNOSIS — M542 Cervicalgia: Secondary | ICD-10-CM

## 2016-07-20 DIAGNOSIS — M545 Low back pain, unspecified: Secondary | ICD-10-CM

## 2016-07-20 DIAGNOSIS — G8929 Other chronic pain: Secondary | ICD-10-CM

## 2016-07-20 DIAGNOSIS — I10 Essential (primary) hypertension: Secondary | ICD-10-CM

## 2016-07-20 MED ORDER — CYCLOBENZAPRINE HCL 10 MG PO TABS: ORAL_TABLET | ORAL | 0 refills | Status: DC

## 2016-07-20 MED ORDER — METOPROLOL TARTRATE 50 MG PO TABS: 50.0000 mg | ORAL_TABLET | Freq: Two times a day (BID) | ORAL | 3 refills | Status: DC

## 2016-07-20 MED FILL — AMLODIPINE BESYLATE 10 MG T: 10 | 30 days supply | Qty: 30 | Fill #2

## 2016-07-20 MED FILL — ALLOPURINOL 300 MG TABLET: 300 | 30 days supply | Qty: 30 | Fill #2

## 2016-07-20 MED FILL — BIDIL TABLET: 20-37.5 | 30 days supply | Qty: 180 | Fill #4

## 2016-07-20 MED FILL — METOPROLOL TARTRATE 50 MG T: 50 | 30 days supply | Qty: 60 | Fill #2

## 2016-07-20 MED FILL — ACETAMINOPHEN/COD #3 TABLET: 300-30 | 30 days supply | Qty: 90 | Fill #2

## 2016-07-20 NOTE — Telephone Encounter (Signed)
Patient called the office to request medication refills for  cyclobenzaprine (FLEXERIL) 10 MG tablet  acetaminophen-codeine (TYLENOL #3) 300-30 MG tablet    Thank you.

## 2016-07-20 NOTE — Telephone Encounter (Signed)
Patient is requesting a prescription refill for :  metoprolol (LOPRESSOR) 50 MG tablet [[id number]][[id number]] .

## 2016-07-20 NOTE — Telephone Encounter (Signed)
Cyclobenzaprine and metoprolol refilled, will defer controlled substances to Dr. Armen PickupFunches.

## 2016-07-21 MED ORDER — ALLOPURINOL 300 MG PO TABS: 300.0000 mg | ORAL_TABLET | Freq: Every day | ORAL | 3 refills | Status: DC

## 2016-07-21 MED ORDER — CYCLOBENZAPRINE HCL 10 MG PO TABS: ORAL_TABLET | ORAL | 3 refills | Status: DC

## 2016-07-21 MED ORDER — ACETAMINOPHEN-CODEINE #3 300-30 MG PO TABS: ORAL_TABLET | ORAL | 2 refills | Status: DC

## 2016-07-21 MED FILL — CYCLOBENZAPRINE 10 MG TAB: 10 | 10 days supply | Qty: 30 | Fill #0

## 2016-07-21 NOTE — Telephone Encounter (Signed)
Tylenol #3 ready for pick up Please inform patient  

## 2016-07-22 NOTE — Telephone Encounter (Signed)
Pt was called and a VM was left informing pt that script is ready to be picked up. 

## 2016-08-12 ENCOUNTER — Ambulatory Visit: Payer: Medicaid Other | Admitting: Cardiology

## 2016-08-18 ENCOUNTER — Telehealth (HOSPITAL_COMMUNITY): Payer: Self-pay

## 2016-08-18 ENCOUNTER — Other Ambulatory Visit: Payer: Self-pay | Admitting: Family Medicine

## 2016-08-18 MED FILL — BUPROPION SR 150 MG TABLET: 150 | 30 days supply | Qty: 60 | Fill #1

## 2016-08-18 MED FILL — ALL DAY ALLERGY 10 MG TAB: 10 | 30 days supply | Qty: 30 | Fill #2

## 2016-08-18 MED FILL — ALLOPURINOL 300 MG TABLET: 300 | 30 days supply | Qty: 30 | Fill #3

## 2016-08-18 MED FILL — BIDIL TABLET: 20-37.5 | 30 days supply | Qty: 180 | Fill #5

## 2016-08-18 MED FILL — TAMSULOSIN HCL 0.4 MG CAP: 0.4 | 30 days supply | Qty: 30 | Fill #2

## 2016-08-18 MED FILL — GABAPENTIN 300 MG CAPSULE: 300 | 30 days supply | Qty: 90 | Fill #2

## 2016-08-18 MED FILL — AMLODIPINE BESYLATE 10 MG T: 10 | 30 days supply | Qty: 30 | Fill #0

## 2016-08-18 MED FILL — METOPROLOL TARTRATE 50 MG T: 50 | 30 days supply | Qty: 60 | Fill #3

## 2016-08-18 NOTE — Telephone Encounter (Signed)
08/18/2016  Attempted to call patient to schedule for new pharmacy service regarding inhaler education and technique. Left HIPAA compliant message with clinic phone number to call to schedule and Dr. Bufford LopeHammer's phone number for more information regarding service.  Allie BossierApryl Anderson, PharmD PGY1 Pharmacy Resident [phone] (Pager) 08/18/2016 3:12 PM

## 2016-08-23 MED FILL — ACETAMINOPHEN/COD #3 TABLET: 300-30 | 30 days supply | Qty: 90 | Fill #0

## 2016-08-25 ENCOUNTER — Encounter (HOSPITAL_COMMUNITY): Payer: Self-pay

## 2016-08-25 ENCOUNTER — Emergency Department (HOSPITAL_COMMUNITY)
Admission: EM | Admit: 2016-08-25 | Discharge: 2016-08-26 | Disposition: A | Discharge status: Home / Self Care | Payer: Medicaid Other | Attending: Emergency Medicine | Admitting: Emergency Medicine

## 2016-08-25 DIAGNOSIS — I5042 Chronic combined systolic (congestive) and diastolic (congestive) heart failure: Secondary | ICD-10-CM | POA: Insufficient documentation

## 2016-08-25 DIAGNOSIS — N183 Chronic kidney disease, stage 3 (moderate): Secondary | ICD-10-CM | POA: Diagnosis not present

## 2016-08-25 DIAGNOSIS — M79605 Pain in left leg: Secondary | ICD-10-CM

## 2016-08-25 DIAGNOSIS — J45909 Unspecified asthma, uncomplicated: Secondary | ICD-10-CM | POA: Insufficient documentation

## 2016-08-25 DIAGNOSIS — I130 Hypertensive heart and chronic kidney disease with heart failure and stage 1 through stage 4 chronic kidney disease, or unspecified chronic kidney disease: Secondary | ICD-10-CM | POA: Insufficient documentation

## 2016-08-25 DIAGNOSIS — Z79899 Other long term (current) drug therapy: Secondary | ICD-10-CM | POA: Diagnosis not present

## 2016-08-25 NOTE — ED Triage Notes (Signed)
Pt endorses left leg pain behind the knee x 2 days. CMS intact. Pt observed to walk slowly and gingerly on left leg. Pt states "my leg just feels stiff and sore" Pt has hx of blood clot in right leg but states "this doesn't feel like that" Denies shob.

## 2016-08-26 ENCOUNTER — Emergency Department (HOSPITAL_COMMUNITY): Payer: Medicaid Other

## 2016-08-26 ENCOUNTER — Ambulatory Visit (HOSPITAL_BASED_OUTPATIENT_CLINIC_OR_DEPARTMENT_OTHER)
Admission: RE | Admit: 2016-08-26 | Discharge: 2016-08-26 | Disposition: A | Discharge status: Home / Self Care | Payer: Medicaid Other | Source: Ambulatory Visit | Attending: Emergency Medicine | Admitting: Emergency Medicine

## 2016-08-26 ENCOUNTER — Ambulatory Visit (HOSPITAL_COMMUNITY)
Admission: RE | Admit: 2016-08-26 | Discharge: 2016-08-26 | Disposition: A | Discharge status: Home / Self Care | Payer: Medicaid Other | Source: Ambulatory Visit | Attending: Emergency Medicine | Admitting: Emergency Medicine

## 2016-08-26 DIAGNOSIS — M79609 Pain in unspecified limb: Secondary | ICD-10-CM

## 2016-08-26 MED ORDER — HYDROCODONE-ACETAMINOPHEN 5-325 MG PO TABS: ORAL_TABLET | ORAL | 0 refills | Status: DC

## 2016-08-26 MED ORDER — ENOXAPARIN SODIUM 150 MG/ML ~~LOC~~ SOLN
150.0000 mg | Freq: Once | SUBCUTANEOUS | Status: AC
  Administered 2016-08-26: 150 mg via SUBCUTANEOUS
  Filled 2016-08-26: qty 1

## 2016-08-26 MED ORDER — HYDROCODONE-ACETAMINOPHEN 5-325 MG PO TABS
1.0000 | ORAL_TABLET | Freq: Once | ORAL | Status: AC
  Administered 2016-08-26: 1 via ORAL
  Filled 2016-08-26: qty 1

## 2016-08-26 MED ORDER — HYDROCODONE-ACETAMINOPHEN 5-325 MG PO TABS: 1.0000 | ORAL_TABLET | Freq: Once | ORAL | Status: DC

## 2016-08-26 NOTE — Discharge Instructions (Signed)
Take vicodin for breakthrough pain, do not drink alcohol, drive, care for children or do other critical tasks while taking vicodin. ° °Please follow with your primary care doctor in the next 2 days for a check-up. They must obtain records for further management.  ° °Do not hesitate to return to the Emergency Department for any new, worsening or concerning symptoms.  ° °

## 2016-08-26 NOTE — ED Notes (Signed)
Pt stable for discharge, pt has htn and states he will not be driving home.

## 2016-08-26 NOTE — Progress Notes (Signed)
Orthopedic Tech Progress Note Patient Details:  Micheline MazeLennard Goyal June 24, 1966 [id number]  Ortho Devices Type of Ortho Device: Crutches Ortho Device/Splint Interventions: Ordered, Application   Trinna PostMartinez, Tabithia Stroder J 08/26/2016, 12:59 AM

## 2016-08-26 NOTE — ED Provider Notes (Signed)
[facility]-EMERGENCY DEPT Provider Note   CSN: [id number] Arrival date & time: 08/25/16  1914     History   Chief Complaint Chief Complaint  Patient presents with  . Leg Pain    HPI   Blood pressure (!) 167/116, pulse 70, temperature 97.5 F (36.4 C), temperature source Oral, resp. rate 22, height 5\' 7"  (1.702 m), weight (!) 158.8 kg, SpO2 97 %.  Rickey Calderon is a 51 y.o. male complaining of  left knee pain worsening over the course of the last several days it's in the posterior medial aspect of the knee. He's limping on it. This is atraumatic. He denies any swelling. 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 He denies chest pain, cough, shortness of breath, palpitations, fever, chills or lightheadedness. He took 3 naproxen earlier in the day with some relief.  Past Medical History:  Diagnosis Date  . Asthma   . Bronchitis   . CHF (congestive heart failure) (HCC)    EF 30% by echo  . Depression   . Femoral thrombosis (HCC)   . Gout   . Hypertension   . Morbid obesity (HCC)   . Prostatitis   . Seasonal allergies     Patient Active Problem List   Diagnosis Date Noted  . Cardiomyopathy (HCC) 05/17/2016  . Hypertensive cardiomyopathy, without heart failure (HCC) 05/17/2016  . 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 Obesity with sleep apnea 05/17/2016  . OSA (obstructive sleep apnea) 04/22/2016  . Exertional dyspnea 04/22/2016  . Obesity hypoventilation syndrome (HCC) 04/22/2016  . Left ankle sprain 03/24/2016  . CKD (chronic kidney disease) stage 3, GFR 30-59 ml/min 02/02/2016  . Chronic combined systolic and diastolic heart failure, NYHA class 2 (HCC) 09/29/2015  . Chronic neck pain 09/21/2015  . Acute combined systolic and diastolic CHF, NYHA class 2 (HCC) 08/21/2015  . Asthma   . Depression 07/10/2015  . Financial  difficulties 04/30/2015  . Morbid obesity (HCC) 03/31/2015  . Chronic low back pain 03/31/2015  . OSA on CPAP 07/25/2014  . Essential hypertension   . Gout   . Seasonal allergies   . Chronic prostatitis     Past Surgical History:  Procedure Laterality Date  . ABDOMINAL AORTAGRAM N/A 07/28/2014   Procedure: ABDOMINAL Ronny Flurry;  Surgeon: Chuck Hint, MD;  Location: [facility] CATH LAB;  Service: Cardiovascular;  Laterality: N/A;  . LOWER EXTREMITY ANGIOGRAM Bilateral 07/28/2014   Procedure: LOWER EXTREMITY ANGIOGRAM;  Surgeon: Chuck Hint, MD;  Location: [facility] CATH LAB;  Service: Cardiovascular;  Laterality: Bilateral;  . THROMBECTOMY FEMORAL ARTERY Right 08/01/2014   Procedure: THROMBECTOMY RIGHT LEG SFA, Anterior tibial, and perineal arteries with intraoperative arteriograms;  Surgeon: Nada Libman, MD;  Location: [facility] OR;  Service: Vascular;  Laterality: Right;       Home Medications    Prior to Admission medications   Medication Sig Start Date End Date Taking? Authorizing Provider  acetaminophen-codeine (TYLENOL #3) 300-30 MG tablet TAKE 1 TABLET BY MOUTH EVERY 8 HOURS AS NEEDED FOR MODERATE PAIN 07/21/16   Dessa Phi, MD  albuterol (PROVENTIL HFA;VENTOLIN HFA) 108 (90 Base) MCG/ACT inhaler Inhale 2 puffs into the lungs every 6 (six) hours as needed for wheezing or shortness of breath. 08/20/15   Dessa Phi, MD  allopurinol (ZYLOPRIM) 300 MG tablet Take 1 tablet (300 mg total) by mouth daily.  07/21/16   Josalyn Funches, MD  amLODipine (NORVASC) 10 MG tablet TAKE 1 TABLET BY MOUTH DAILY. 08/18/16   Josalyn Funches, MD  buPROPion (WELLBUTRIN SR) 150 MG 12 hr tablet Take 1 tablet (150 mg total) by mouth 2 (two) times daily. 07/11/16   Josalyn Funches, MD  cetirizine (ZYRTEC) 10 MG tablet Take 1 tablet (10 mg total) by mouth daily. 06/10/16   Josalyn Funches, MD  cyclobenzaprine (FLEXERIL) 10 MG tablet TAKE 1 TABLET BY MOUTH 3 TIMES DAILY AS NEEDED FOR MUSCLE SPASMS. 07/21/16    Josalyn Funches, MD  Diclofenac Sodium 3 % GEL Place 1-2 g onto the skin 4 (four) times daily as needed (neck pain). 12/11/15   Josalyn Funches, MD  Fluticasone-Salmeterol (ADVAIR) 100-50 MCG/DOSE AEPB Inhale 1 puff into the lungs 2 (two) times daily. 05/16/16   Jose Alexis FrockAngelo A de Dios, MD  furosemide (LASIX) 40 MG tablet Take 1 tablet (40 mg total) by mouth daily. 05/17/16   Abelino DerrickLuke K Kilroy, PA-C  gabapentin (NEURONTIN) 300 MG capsule TAKE 1 CAPSULE BY MOUTH 3 TIMES DAILY. 06/10/16   Dessa PhiJosalyn Funches, MD  HYDROcodone-acetaminophen (NORCO/VICODIN) 5-325 MG tablet Take 1-2 tablets by mouth every 6 hours as needed for pain and/or cough. 08/26/16   Mariel Gaudin, PA-C  isosorbide-hydrALAZINE (BIDIL) 20-37.5 MG tablet Take 2 tablets by mouth 3 (three) times daily. 02/19/16   Rollene RotundaJames Hochrein, MD  metoprolol (LOPRESSOR) 50 MG tablet Take 1 tablet (50 mg total) by mouth 2 (two) times daily. 07/20/16   Josalyn Funches, MD  Potassium Chloride ER 20 MEQ TBCR Take 1 tablet by mouth daily. 05/17/16   Abelino DerrickLuke K Kilroy, PA-C  tamsulosin (FLOMAX) 0.4 MG CAPS capsule TAKE 1 CAPSULE BY MOUTH DAILY AFTER SUPPER. 06/10/16   Dessa PhiJosalyn Funches, MD    Family History Family History  Problem Relation Age of Onset  . Diabetes Father   . Hyperlipidemia Father     Social History Social History  Substance Use Topics  . Smoking status: Never Smoker  . Smokeless tobacco: Never Used  . Alcohol use No     Allergies   Patient has no known allergies.   Review of Systems Review of Systems  10 systems reviewed and found to be negative, except as noted in the HPI.   Physical Exam Updated Vital Signs BP (!) 161/116   Pulse 63   Temp 97.5 F (36.4 C) (Oral)   Resp 22   Ht 5\' 7"  (1.702 m)   Wt (!) 158.8 kg   SpO2 98%   BMI 54.82 kg/m   Physical Exam  Constitutional: He is oriented to person, place, and time. He appears well-developed and well-nourished. No distress.  Obese  HENT:  Head: Normocephalic and  atraumatic.  Mouth/Throat: Oropharynx is clear and moist.  Eyes: Conjunctivae and EOM are normal. Pupils are equal, round, and reactive to light.  Neck: Normal range of motion. No JVD present. No tracheal deviation present.  Cardiovascular: Normal rate, regular rhythm and intact distal pulses.   Pulmonary/Chest: Effort normal and breath sounds normal. No stridor. No respiratory distress. He has no wheezes. He has no rales. He exhibits no tenderness.  Abdominal: Soft. He exhibits no distension and no mass. There is no tenderness. There is no rebound and no guarding.  Musculoskeletal: Normal range of motion. He exhibits tenderness. He exhibits no edema.  Left knee: No deformity, erythema or abrasions. FROM. No effusion or crepitance. Anterior and posterior drawer show no abnormal laxity. Stable to valgus and varus stress.  Joint lines are non-tender. Neurovascularly intact.   No calf asymmetry, superficial collaterals, palpable cords, edema, Homans sign negative bilaterally.    Neurological: He is alert and oriented to person, place, and time.  Skin: Skin is warm. He is not diaphoretic.  Psychiatric: He has a normal mood and affect.  Nursing note and vitals reviewed.    ED Treatments / Results  Labs (all labs ordered are listed, but only abnormal results are displayed) Labs Reviewed - No data to display  EKG  EKG Interpretation None       Radiology Dg Knee Complete 4 Views Left  Result Date: 08/26/2016 CLINICAL DATA:  LEFT knee pain for 1 week. EXAM: LEFT KNEE - COMPLETE 4+ VIEW COMPARISON:  None. FINDINGS: No acute fracture deformity or dislocation. Proximal lateral exostosis. Joint spaces intact without erosions, mild tricompartmental marginal spurring. No destructive bony lesions. Soft tissue planes are not suspicious. Large body habitus. 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 Electronically Signed   By: Awilda Metro M.D.   On:  08/26/2016 00:56    Procedures Procedures (including critical care time)  Medications Ordered in ED Medications  HYDROcodone-acetaminophen (NORCO/VICODIN) 5-325 MG per tablet 1 tablet (1 tablet Oral Given 08/26/16 0017)  enoxaparin (LOVENOX) injection 150 mg (150 mg Subcutaneous Given 08/26/16 0049)     Initial Impression / Assessment and Plan / ED Course  I have reviewed the triage vital signs and the nursing notes.  Pertinent labs & imaging results that were available during my care of the patient were reviewed by me and considered in my medical decision making (see chart for details).     Vitals:   08/25/16 2328 08/25/16 2345 08/26/16 0104 08/26/16 0105  BP: (!) 167/116 143/93 (!) 161/116   Pulse: 70 62  63  Resp: 22     Temp:      TempSrc:      SpO2: 97% 94%  98%  Weight:      Height:        Medications  HYDROcodone-acetaminophen (NORCO/VICODIN) 5-325 MG per tablet 1 tablet (1 tablet Oral Given 08/26/16 0017)  enoxaparin (LOVENOX) injection 150 mg (150 mg Subcutaneous Given 08/26/16 0049)    Rickey Calderon is 51 y.o. male presenting with Left knee pain worsening over the course of several days. He states that this does not feel similar to prior DVT however I would like to obtain a vascular duplex. Patient will be given Lovenox and asked to return to the ED for study in the a.m. X-ray and pain medication pending.  X-ray with osteoarthritis, this is likely the cause of this patient's pain however, will be given Lovenox and he is instructed to return first thing in the a.m. for outpatient vascular study. Patient is also given crutches for comfort.  Evaluation does not show pathology that would require ongoing emergent intervention or inpatient treatment. Pt is hemodynamically stable and mentating appropriately. Discussed findings and plan with patient/guardian, who agrees with care plan. All questions answered. Return precautions discussed and outpatient follow up given.       Final Clinical Impressions(s) / ED Diagnoses   Final diagnoses:  Left leg pain    New Prescriptions Discharge Medication List as of 08/26/2016  1:17 AM    START taking these medications   Details  HYDROcodone-acetaminophen (NORCO/VICODIN) 5-325 MG tablet Take 1-2 tablets by mouth every 6 hours as needed for pain and/or cough., [employer], PA-C 08/26/16 [phone]  Layla Maw Ward, DO 08/26/16 [phone]

## 2016-08-26 NOTE — Progress Notes (Signed)
*  PRELIMINARY RESULTS* Vascular Ultrasound Left lower extremity venous duplex has been completed.  Preliminary findings: No evidence of deep vein thrombosis or baker's cysts in the left lower extremity.   Rickey FischerCharlotte C Guy Toney 08/26/2016, 8:15 AM

## 2016-08-29 ENCOUNTER — Telehealth: Payer: Self-pay | Admitting: Pulmonary Disease

## 2016-08-29 NOTE — Telephone Encounter (Signed)
lmom tcb x1  Pt needs a  bipap follow up in 4-6 weeks with AD or NP

## 2016-08-31 NOTE — Telephone Encounter (Signed)
Appointment made pt aware

## 2016-09-01 NOTE — Progress Notes (Signed)
Cardiology Office Note   Date:  09/02/2016   ID:  Rickey Calderon, DOB 1966-02-01, MRN [id number]  PCP:  [name], [name], MD  Cardiologist:   Rollene RotundaJames Stephonie Wilcoxen, MD   Chief Complaint  Patient presents with  . Cardiomyopathy      History of Present Illness: Rickey MazeLennard Swendsen is a 51 y.o. male who presents for evaluation of cardiomyopathy. He's had dyspnea for some time. He became more short of breath with less activity last year and actually went to the emergency room. His primary provider had ordered an echocardiogram. During his ER visit on he was found to have chest x-ray consistent with heart failure and elevated BNP. A couple of days later he came to our office for an echocardiogram was found to have an ejection fraction of 25-30%.  I saw him in March.  I ordered lower extremity Doppler and there was no evidence of residual clot from previous arterial thrombus.  I stopped Xarelto and drew a hypercoag work up.  This was negative.  Xarelto was stopped.  He came back to see Wilburt FinlayBryan Hager and had Norvasc increased as his BP was still elevated.  Follow up echo in Nov demonstrated an EF of 50%.  Since I last saw him he was in the ED with leg pain.  I reviewed these records for this visit.  He had no evidence of DVT.    Since I last saw him he has done well.  The patient denies any new symptoms such as chest discomfort, neck or arm discomfort. There has been no new shortness of breath, PND or orthopnea. There have been no reported palpitations, presyncope or syncope.   He has a new BiPAP machine.    Past Medical History:  Diagnosis Date  . Asthma   . Bronchitis   . CHF (congestive heart failure) (HCC)    EF 30% by echo,  EF 50% Nov 2017  . Depression   . Femoral thrombosis (HCC)   . Gout   . Hypertension   . Morbid obesity (HCC)   . Prostatitis   . Seasonal allergies   . Sleep apnea    BiPAP    Past Surgical History:  Procedure Laterality Date  . ABDOMINAL AORTAGRAM N/A 07/28/2014   Procedure: ABDOMINAL Ronny FlurryAORTAGRAM;  Surgeon: Chuck Hinthristopher S Dickson, MD;  [facility];  Service: Cardiovascular;  Laterality: N/A;  . LOWER EXTREMITY ANGIOGRAM Bilateral 07/28/2014   Procedure: LOWER EXTREMITY ANGIOGRAM;  Surgeon: Chuck Hinthristopher S Dickson, MD;  [facility];  Service: Cardiovascular;  Laterality: Bilateral;  . THROMBECTOMY FEMORAL ARTERY Right 08/01/2014   Procedure: THROMBECTOMY RIGHT LEG SFA, Anterior tibial, and perineal arteries with intraoperative arteriograms;  Surgeon: Nada LibmanVance W Brabham, MD;  [facility];  Service: Vascular;  Laterality: Right;     Current Outpatient Prescriptions  Medication Sig Dispense Refill  . acetaminophen-codeine (TYLENOL #3) 300-30 MG tablet TAKE 1 TABLET BY MOUTH EVERY 8 HOURS AS NEEDED FOR MODERATE PAIN 90 tablet 2  . albuterol (PROVENTIL HFA;VENTOLIN HFA) 108 (90 Base) MCG/ACT inhaler Inhale 2 puffs into the lungs every 6 (six) hours as needed for wheezing or shortness of breath. 1 Inhaler 0  . allopurinol (ZYLOPRIM) 300 MG tablet Take 1 tablet (300 mg total) by mouth daily. 30 tablet 3  . amLODipine (NORVASC) 10 MG tablet TAKE 1 TABLET BY MOUTH DAILY. 30 tablet 0  . buPROPion (WELLBUTRIN SR) 150 MG 12 hr tablet Take 1 tablet (150 mg total) by mouth 2 (two)  times daily. 60 tablet 2  . cetirizine (ZYRTEC) 10 MG tablet Take 1 tablet (10 mg total) by mouth daily. 30 tablet 2  . cyclobenzaprine (FLEXERIL) 10 MG tablet TAKE 1 TABLET BY MOUTH 3 TIMES DAILY AS NEEDED FOR MUSCLE SPASMS. 60 tablet 3  . Diclofenac Sodium 3 % GEL Place 1-2 g onto the skin 4 (four) times daily as needed (neck pain). 100 g 2  . Fluticasone-Salmeterol (ADVAIR) 100-50 MCG/DOSE AEPB Inhale 1 puff into the lungs 2 (two) times daily. 60 each 2  . furosemide (LASIX) 40 MG tablet Take 1 tablet (40 mg total) by mouth daily. 90 tablet 1  . gabapentin (NEURONTIN) 300 MG capsule TAKE 1 CAPSULE BY MOUTH 3 TIMES DAILY. 90 capsule 3  . HYDROcodone-acetaminophen (NORCO/VICODIN)  5-325 MG tablet Take 1-2 tablets by mouth every 6 hours as needed for pain and/or cough. 7 tablet 0  . isosorbide-hydrALAZINE (BIDIL) 20-37.5 MG tablet Take 2 tablets by mouth 3 (three) times daily. 180 tablet 11  . metoprolol (LOPRESSOR) 50 MG tablet Take 1 tablet (50 mg total) by mouth 2 (two) times daily. 60 tablet 3  . Potassium Chloride ER 20 MEQ TBCR Take 1 tablet by mouth daily. 90 tablet 1  . tamsulosin (FLOMAX) 0.4 MG CAPS capsule TAKE 1 CAPSULE BY MOUTH DAILY AFTER SUPPER. 30 capsule 2   No current facility-administered medications for this visit.     Allergies:   Patient has no known allergies.    ROS:  Please see the history of present illness.   Otherwise, review of systems are positive for none.   All other systems are reviewed and negative.    PHYSICAL EXAM: VS:  BP 132/84   Pulse 70   Ht 5\' 7"  (1.702 m)   Wt (!) 362 lb (164.2 kg)   BMI 56.70 kg/m  , BMI Body mass index is 56.7 kg/m. GENERAL:  Well appearing NECK:  No jugular venous distention, waveform within normal limits, carotid upstroke brisk and symmetric, no bruits, no thyromegaly LYMPHATICS:  No cervical, inguinal adenopathy LUNGS:  Clear to auscultation bilaterally BACK:  No CVA tenderness CHEST:  Unremarkable HEART:  PMI not displaced or sustained,S1 and S2 within normal limits, no S3, no S4, no clicks, no rubs, no murmurs ABD:  Flat, positive bowel sounds normal in frequency in pitch, no bruits, no rebound, no guarding, no midline pulsatile mass, no hepatomegaly, no splenomegaly, obese EXT:  2 plus pulses throughout, mild edema, no cyanosis no clubbing SKIN:  No rashes no nodules    EKG:  EKG is  ordered today. Sinus rhythm, rate 69, axis within normal limits, intervals within normal limits, no acute ST-T wave changes.  Recent Labs: 02/12/2016: BUN 18; Creat 1.24; Potassium 3.8; Sodium 141    Lipid Panel    Component Value Date/Time   CHOL 169 03/31/2015 1148   TRIG 88 03/31/2015 1148   HDL 54  03/31/2015 1148   CHOLHDL 3.1 03/31/2015 1148   VLDL 18 03/31/2015 1148   LDLCALC 97 03/31/2015 1148      Wt Readings from Last 3 Encounters:  09/02/16 (!) 362 lb (164.2 kg)  08/25/16 (!) 350 lb (158.8 kg)  06/10/16 (!) 355 lb (161 kg)      Other studies Reviewed: Additional studies/ records that were reviewed today include:  None Review of the above records demonstrates:    ASSESSMENT AND PLAN:  CARDIOMYOPATHY:   His EF improved . I suspect this is secondary to hypertension.  He still  needs an ischemia work up and will come back for a POET (Plain Old Exercise Treadmill)  HTN:  This is being managed in the context of treating his CHF.    SLEEP APNEA:  He did follow up with a pulmonologist.  He had another sleep study.  He is on BiPAP  OBESITY:  We began to talk about diet and the need for weight loss as part of this therapy.  I would not want to prescribe any of the weight loss medications at this point.    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 He's off his anticoagulant.    Current medicines are reviewed at length with the patient today.  The patient does not have concerns regarding medicines.  The following changes have been made:  None  Labs/ tests ordered today include:   Orders Placed This Encounter  Procedures  . EXERCISE TOLERANCE TEST  . EKG 12-Lead     Disposition:   FU with me in 12 months.    Signed, Rollene Rotunda, MD  09/02/2016 9:23 AM    [facility]

## 2016-09-02 ENCOUNTER — Encounter: Payer: Self-pay | Admitting: Cardiology

## 2016-09-02 ENCOUNTER — Ambulatory Visit (INDEPENDENT_AMBULATORY_CARE_PROVIDER_SITE_OTHER): Payer: Medicaid Other | Admitting: Cardiology

## 2016-09-02 VITALS — BP 132/84 | HR 70 | Ht 67.0 in | Wt 362.0 lb

## 2016-09-02 DIAGNOSIS — I429 Cardiomyopathy, unspecified: Secondary | ICD-10-CM | POA: Diagnosis not present

## 2016-09-02 NOTE — Patient Instructions (Signed)
Medication Instructions:  Continue current medications  Labwork: None Ordered  Testing/Procedures: Your physician has requested that you have an exercise tolerance test. For further information please visit [email or web address]. Please also follow instruction sheet, as given.   Follow-Up: Your physician wants you to follow-up in: 1 Year. You will receive a reminder letter in the mail two months in advance. If you don't receive a letter, please call our office to schedule the follow-up appointment.   Any Other Special Instructions Will Be Listed Below (If Applicable).   If you need a refill on your cardiac medications before your next appointment, please call your pharmacy.   

## 2016-09-06 ENCOUNTER — Telehealth: Payer: Self-pay

## 2016-09-06 NOTE — Telephone Encounter (Signed)
09/06/2016  Contacted Mr. Rickey JeanVice today to attempt to [facility] for new pharmacy service regarding inhaler education and use. Left HIPAA compliant message for patient to call Dr. Viann FishStacey Hammer to schedule an appointment.   Rickey BossierApryl Ovadia Calderon, PharmD PGY1 Pharmacy Resident [phone] (Pager)

## 2016-09-12 ENCOUNTER — Other Ambulatory Visit: Payer: Self-pay | Admitting: Family Medicine

## 2016-09-13 ENCOUNTER — Telehealth (HOSPITAL_COMMUNITY): Payer: Self-pay

## 2016-09-13 NOTE — Telephone Encounter (Signed)
Encounter complete. Awaiting return call at this time. 

## 2016-09-14 ENCOUNTER — Telehealth (HOSPITAL_COMMUNITY): Payer: Self-pay

## 2016-09-14 NOTE — Telephone Encounter (Signed)
Encounter complete. 

## 2016-09-15 ENCOUNTER — Inpatient Hospital Stay (HOSPITAL_COMMUNITY)
Admission: RE | Admit: 2016-09-15 | Payer: Medicaid Other | Source: Ambulatory Visit | Attending: Cardiology | Admitting: Cardiology

## 2016-09-21 ENCOUNTER — Telehealth: Payer: Self-pay | Admitting: Family Medicine

## 2016-09-21 DIAGNOSIS — M545 Low back pain: Principal | ICD-10-CM

## 2016-09-21 DIAGNOSIS — G8929 Other chronic pain: Secondary | ICD-10-CM

## 2016-09-21 DIAGNOSIS — M542 Cervicalgia: Secondary | ICD-10-CM

## 2016-09-21 MED ORDER — CYCLOBENZAPRINE HCL 10 MG PO TABS: ORAL_TABLET | ORAL | 0 refills | Status: DC

## 2016-09-21 MED FILL — ?CYCLOBENZAPRINE 10 MG TABL: 10 | 20 days supply | Qty: 60 | Fill #0

## 2016-09-21 NOTE — Telephone Encounter (Signed)
Cyclobenzaprine refilled.

## 2016-09-21 NOTE — Telephone Encounter (Signed)
Pt calling to request a refill on his Flexeril. Please f/u.

## 2016-09-26 MED FILL — TAMSULOSIN HCL 0.4 MG CAP: 0.4 | 30 days supply | Qty: 30 | Fill #0

## 2016-09-26 MED FILL — AMLODIPINE BESYLATE 10 MG T: 10 | 30 days supply | Qty: 30 | Fill #0

## 2016-09-26 MED FILL — ALLOPURINOL 300 MG TABLET: 300 | 30 days supply | Qty: 30 | Fill #0

## 2016-09-26 MED FILL — ALL DAY ALLERGY 10 MG TAB: 10 | 30 days supply | Qty: 30 | Fill #0

## 2016-09-26 MED FILL — ACETAMINOPHEN/COD #3 TABLET: 300-30 | 30 days supply | Qty: 90 | Fill #1

## 2016-09-26 MED FILL — BUPROPION SR 150 MG TABLET: 150 | 30 days supply | Qty: 60 | Fill #2

## 2016-09-26 MED FILL — GABAPENTIN 300 MG CAPSULE: 300 | 30 days supply | Qty: 90 | Fill #3

## 2016-09-27 ENCOUNTER — Telehealth (HOSPITAL_COMMUNITY): Payer: Self-pay

## 2016-09-27 NOTE — Telephone Encounter (Signed)
Encounter complete. 

## 2016-09-28 ENCOUNTER — Ambulatory Visit: Payer: Medicaid Other | Admitting: Pulmonary Disease

## 2016-09-29 ENCOUNTER — Inpatient Hospital Stay (HOSPITAL_COMMUNITY): Admission: RE | Admit: 2016-09-29 | Payer: Medicaid Other | Source: Ambulatory Visit

## 2016-10-03 ENCOUNTER — Telehealth: Payer: Self-pay

## 2016-10-03 NOTE — Telephone Encounter (Signed)
Called to find out if pt would like to schedule colonoscopy  

## 2016-10-05 ENCOUNTER — Telehealth (HOSPITAL_COMMUNITY): Payer: Self-pay | Admitting: Cardiology

## 2016-10-05 NOTE — Telephone Encounter (Signed)
09-29-16 Front desk notified of insurance situation per [employer]...pamr 09-25-16 Pt called back states this is "what I use to get my meds"..told him to ck with case worker as it shows now they will not pay. He will call me back...pamr 09-25-16 [employer] website shows Family Planning coverage which we do NOT take. I LM on pts cell phone to call me...Marland Kitchenpamr

## 2016-11-01 ENCOUNTER — Ambulatory Visit: Payer: Medicaid Other | Attending: Family Medicine

## 2016-11-01 ENCOUNTER — Other Ambulatory Visit: Payer: Self-pay | Admitting: Family Medicine

## 2016-11-01 ENCOUNTER — Telehealth: Payer: Self-pay | Admitting: Family Medicine

## 2016-11-01 DIAGNOSIS — M1000 Idiopathic gout, unspecified site: Secondary | ICD-10-CM

## 2016-11-01 DIAGNOSIS — F329 Major depressive disorder, single episode, unspecified: Secondary | ICD-10-CM

## 2016-11-01 DIAGNOSIS — M542 Cervicalgia: Secondary | ICD-10-CM

## 2016-11-01 DIAGNOSIS — F32A Depression, unspecified: Secondary | ICD-10-CM

## 2016-11-01 DIAGNOSIS — J4520 Mild intermittent asthma, uncomplicated: Secondary | ICD-10-CM

## 2016-11-01 DIAGNOSIS — M545 Low back pain: Secondary | ICD-10-CM

## 2016-11-01 DIAGNOSIS — G8929 Other chronic pain: Secondary | ICD-10-CM

## 2016-11-01 DIAGNOSIS — R0602 Shortness of breath: Secondary | ICD-10-CM

## 2016-11-01 DIAGNOSIS — M5440 Lumbago with sciatica, unspecified side: Secondary | ICD-10-CM

## 2016-11-01 DIAGNOSIS — I10 Essential (primary) hypertension: Secondary | ICD-10-CM

## 2016-11-01 MED ORDER — METOPROLOL TARTRATE 50 MG PO TABS: 50.0000 mg | ORAL_TABLET | Freq: Two times a day (BID) | ORAL | 0 refills | Status: DC

## 2016-11-01 MED ORDER — GABAPENTIN 300 MG PO CAPS: 300.0000 mg | ORAL_CAPSULE | Freq: Three times a day (TID) | ORAL | 0 refills | Status: DC

## 2016-11-01 MED ORDER — ISOSORB DINITRATE-HYDRALAZINE 20-37.5 MG PO TABS: 2.0000 | ORAL_TABLET | Freq: Three times a day (TID) | ORAL | 0 refills | Status: DC

## 2016-11-01 MED ORDER — DICLOFENAC SODIUM 3 % TD GEL: 1.0000 g | Freq: Four times a day (QID) | TRANSDERMAL | 2 refills | Status: DC | PRN

## 2016-11-01 MED ORDER — BUPROPION HCL ER (SR) 150 MG PO TB12: 150.0000 mg | ORAL_TABLET | Freq: Two times a day (BID) | ORAL | 2 refills | Status: DC

## 2016-11-01 MED ORDER — CETIRIZINE HCL 10 MG PO TABS: 10.0000 mg | ORAL_TABLET | Freq: Every day | ORAL | 0 refills | Status: DC

## 2016-11-01 MED ORDER — AMLODIPINE BESYLATE 10 MG PO TABS: ORAL_TABLET | ORAL | 0 refills | Status: DC

## 2016-11-01 MED ORDER — ALLOPURINOL 300 MG PO TABS: 300.0000 mg | ORAL_TABLET | Freq: Every day | ORAL | 0 refills | Status: DC

## 2016-11-01 MED ORDER — FLUTICASONE-SALMETEROL 100-50 MCG/DOSE IN AEPB: 1.0000 | INHALATION_SPRAY | Freq: Two times a day (BID) | RESPIRATORY_TRACT | 2 refills | Status: DC

## 2016-11-01 MED ORDER — POTASSIUM CHLORIDE ER 20 MEQ PO TBCR: 1.0000 | EXTENDED_RELEASE_TABLET | Freq: Every day | ORAL | 0 refills | Status: DC

## 2016-11-01 MED ORDER — ALBUTEROL SULFATE HFA 108 (90 BASE) MCG/ACT IN AERS: 2.0000 | INHALATION_SPRAY | Freq: Four times a day (QID) | RESPIRATORY_TRACT | 0 refills | Status: DC | PRN

## 2016-11-01 MED ORDER — FUROSEMIDE 40 MG PO TABS: 40.0000 mg | ORAL_TABLET | Freq: Every day | ORAL | 0 refills | Status: DC

## 2016-11-01 MED ORDER — CYCLOBENZAPRINE HCL 10 MG PO TABS: ORAL_TABLET | ORAL | 0 refills | Status: DC

## 2016-11-01 MED ORDER — TAMSULOSIN HCL 0.4 MG PO CAPS: ORAL_CAPSULE | ORAL | 0 refills | Status: DC

## 2016-11-01 MED FILL — METOPROLOL TARTRATE 50 MG T: 50 | 30 days supply | Qty: 60 | Fill #0

## 2016-11-01 MED FILL — CYCLOBENZAPRINE 10 MG TAB: 10 | 20 days supply | Qty: 60 | Fill #0

## 2016-11-01 MED FILL — ALLOPURINOL 300 MG TABLET: 300 | 30 days supply | Qty: 30 | Fill #1

## 2016-11-01 NOTE — Telephone Encounter (Signed)
All meds refilled except tylenol #3 which requires appt Please inform patient

## 2016-11-01 NOTE — Telephone Encounter (Signed)
Pt requesting a refill on all current meds. Scheduled appt for 11/14/16. Please f/u.

## 2016-11-02 MED FILL — AMLODIPINE BESYLATE 10 MG T: 10 | 30 days supply | Qty: 30 | Fill #0

## 2016-11-02 MED FILL — ALL DAY ALLERGY 10 MG TAB: 10 | 30 days supply | Qty: 30 | Fill #0

## 2016-11-02 MED FILL — TAMSULOSIN HCL 0.4 MG CAP: 0.4 | 30 days supply | Qty: 30 | Fill #0

## 2016-11-02 MED FILL — ADVAIR 100/50 DISKUS: 100-50 | 30 days supply | Qty: 60 | Fill #0

## 2016-11-02 MED FILL — GABAPENTIN 300 MG CAPSULE: 300 | 30 days supply | Qty: 90 | Fill #0

## 2016-11-02 MED FILL — BIDIL TABLET: 20-37.5 | 30 days supply | Qty: 180 | Fill #0

## 2016-11-02 MED FILL — FUROSEMIDE 40 MG TABLET: 40 | 30 days supply | Qty: 30 | Fill #0

## 2016-11-02 MED FILL — POTASSIUM CL ER 20 MEQ TAB: 20 | 30 days supply | Qty: 30 | Fill #0

## 2016-11-02 MED FILL — BUPROPION SR 150 MG TABLET: 150 | 30 days supply | Qty: 60 | Fill #0

## 2016-11-02 MED FILL — PROVENTIL HFA 108 (90 BASE): 108 (90 BAS | 25 days supply | Qty: 7 | Fill #0

## 2016-11-02 MED FILL — DICLOFENAC SODIUM 3% GEL: 3 | 12 days supply | Qty: 100 | Fill #0

## 2016-11-02 NOTE — Telephone Encounter (Signed)
Pt was called and informed of medication being refilled and sent to onsite pharmacy.

## 2016-11-09 ENCOUNTER — Encounter: Payer: Self-pay | Admitting: Family Medicine

## 2016-11-14 ENCOUNTER — Ambulatory Visit (HOSPITAL_COMMUNITY)
Admission: EM | Admit: 2016-11-14 | Discharge: 2016-11-14 | Disposition: A | Discharge status: Home / Self Care | Payer: Medicaid Other | Attending: Family Medicine | Admitting: Family Medicine

## 2016-11-14 ENCOUNTER — Encounter (HOSPITAL_COMMUNITY): Payer: Self-pay | Admitting: Emergency Medicine

## 2016-11-14 ENCOUNTER — Ambulatory Visit: Payer: Medicaid Other | Admitting: Family Medicine

## 2016-11-14 DIAGNOSIS — T148XXA Other injury of unspecified body region, initial encounter: Secondary | ICD-10-CM

## 2016-11-14 MED ORDER — PREDNISONE 50 MG PO TABS: ORAL_TABLET | ORAL | 0 refills | Status: DC

## 2016-11-14 NOTE — ED Triage Notes (Signed)
[facility] with a complaint of right buttock muscle pain that hurts when he walks, coughs and with other movement x 1 day. The patient denied any known injury.

## 2016-11-14 NOTE — Discharge Instructions (Signed)
Place ice to the area pain of the buttock off and on for the next couple days. Then switch to heat. Take your Tylenol 3 as needed for pain as well as the prednisone as directed. Take that with food. Follow-up with Dr. Timoteo AceFutch's later this week, keep her appointment.

## 2016-11-14 NOTE — ED Provider Notes (Signed)
CSN: [id number]     Arrival date & time 11/14/16  1841 History   First MD Initiated Contact with Patient 11/14/16 2012     Chief Complaint  Patient presents with  . Muscle Pain   (Consider location/radiation/quality/duration/timing/severity/associated sxs/prior Treatment) 51 year old morbidly obese male states he awoke this morning with right mid buttock pain. Pain is worse with coughing, moving his right leg in certain positions and with ambulation. He is able to bear full weight on the right leg. Denies any known injury. No fall no blunt trauma no repetitive movement or work that he can recall that may have produced pain.      Past Medical History:  Diagnosis Date  . Asthma   . Bronchitis   . CHF (congestive heart failure) (HCC)    EF 30% by echo,  EF 50% Nov 2017  . Depression   . Femoral thrombosis (HCC)   . Gout   . Hypertension   . Morbid obesity (HCC)   . Prostatitis   . Seasonal allergies   . Sleep apnea    BiPAP   Past Surgical History:  Procedure Laterality Date  . ABDOMINAL AORTAGRAM N/A 07/28/2014   Procedure: ABDOMINAL Ronny Flurry;  Surgeon: Chuck Hint, MD;  Location: [facility] CATH LAB;  Service: Cardiovascular;  Laterality: N/A;  . LOWER EXTREMITY ANGIOGRAM Bilateral 07/28/2014   Procedure: LOWER EXTREMITY ANGIOGRAM;  Surgeon: Chuck Hint, MD;  Location: [facility] CATH LAB;  Service: Cardiovascular;  Laterality: Bilateral;  . THROMBECTOMY FEMORAL ARTERY Right 08/01/2014   Procedure: THROMBECTOMY RIGHT LEG SFA, Anterior tibial, and perineal arteries with intraoperative arteriograms;  Surgeon: Nada Libman, MD;  Location: [facility] OR;  Service: Vascular;  Laterality: Right;   Family History  Problem Relation Age of Onset  . Diabetes Father   . Hyperlipidemia Father    Social History  Substance Use Topics  . Smoking status: Never Smoker  . Smokeless tobacco: Never Used  . Alcohol use No    Review of Systems  Constitutional: Negative.   Respiratory:  Negative.   Gastrointestinal: Negative.   Genitourinary: Negative.   Musculoskeletal: Positive for myalgias. Negative for joint swelling.       As per HPI  Skin: Negative.   Neurological: Negative for dizziness, weakness, numbness and headaches.       Occasionally feels numbness going down the back of the right leg.  All other systems reviewed and are negative.   Allergies  Patient has no known allergies.  Home Medications   Prior to Admission medications   Medication Sig Start Date End Date Taking? Authorizing Provider  acetaminophen-codeine (TYLENOL #3) 300-30 MG tablet TAKE 1 TABLET BY MOUTH EVERY 8 HOURS AS NEEDED FOR MODERATE PAIN 07/21/16   Funches, Gerilyn Nestle, MD  albuterol (PROVENTIL HFA;VENTOLIN HFA) 108 (90 Base) MCG/ACT inhaler Inhale 2 puffs into the lungs every 6 (six) hours as needed for wheezing or shortness of breath. 11/01/16   Funches, Gerilyn Nestle, MD  allopurinol (ZYLOPRIM) 300 MG tablet Take 1 tablet (300 mg total) by mouth daily. 11/01/16   Funches, Gerilyn Nestle, MD  amLODipine (NORVASC) 10 MG tablet TAKE 1 TABLET BY MOUTH DAILY. MUST HAVE OFFICE VISIT FOR REFILLS 11/01/16   Dessa Phi, MD  buPROPion (WELLBUTRIN SR) 150 MG 12 hr tablet Take 1 tablet (150 mg total) by mouth 2 (two) times daily. 11/01/16   Funches, Gerilyn Nestle, MD  cetirizine (ZYRTEC) 10 MG tablet Take 1 tablet (10 mg total) by mouth daily. 11/01/16   Dessa Phi, MD  cyclobenzaprine (FLEXERIL)  10 MG tablet TAKE 1 TABLET BY MOUTH 3 TIMES DAILY AS NEEDED FOR MUSCLE SPASMS. 11/01/16   Dessa PhiFunches, Josalyn, MD  Diclofenac Sodium 3 % GEL Place 1-2 g onto the skin 4 (four) times daily as needed (neck pain). 11/01/16   Funches, Gerilyn NestleJosalyn, MD  Fluticasone-Salmeterol (ADVAIR) 100-50 MCG/DOSE AEPB Inhale 1 puff into the lungs 2 (two) times daily. 11/01/16   Funches, Gerilyn NestleJosalyn, MD  furosemide (LASIX) 40 MG tablet Take 1 tablet (40 mg total) by mouth daily. 11/01/16   Funches, Gerilyn NestleJosalyn, MD  gabapentin (NEURONTIN) 300 MG capsule Take 1 capsule  (300 mg total) by mouth 3 (three) times daily. 11/01/16   Funches, Gerilyn NestleJosalyn, MD  isosorbide-hydrALAZINE (BIDIL) 20-37.5 MG tablet Take 2 tablets by mouth 3 (three) times daily. 11/01/16   Funches, Gerilyn NestleJosalyn, MD  metoprolol (LOPRESSOR) 50 MG tablet Take 1 tablet (50 mg total) by mouth 2 (two) times daily. 11/01/16   Funches, Gerilyn NestleJosalyn, MD  Potassium Chloride ER 20 MEQ TBCR Take 1 tablet by mouth daily. 11/01/16   Funches, Gerilyn NestleJosalyn, MD  predniSONE (DELTASONE) 50 MG tablet 1 tab po daily for 6 days. Take with food. 11/14/16   Hayden RasmussenMabe, Emeli Goguen, NP  tamsulosin (FLOMAX) 0.4 MG CAPS capsule TAKE 1 CAPSULE BY MOUTH DAILY AFTER SUPPER. 11/01/16   Dessa PhiFunches, Josalyn, MD   Meds Ordered and Administered this Visit  Medications - No data to display  BP (!) 151/97 (BP Location: Right Arm) Comment: notified rn  Pulse 71   Temp 97.9 F (36.6 C) (Oral)   Resp 16   SpO2 98%  No data found.   Physical Exam  Constitutional: He is oriented to person, place, and time. He appears well-developed and well-nourished. No distress.  Neck: Normal range of motion.  Pulmonary/Chest: Effort normal.  Musculoskeletal: He exhibits tenderness. He exhibits no edema or deformity.  Tenderness to the mid right buttock. This is a small area. Specific movements of the right leg as well as coughing or producing a sharp pain in this area. It is the area in which the piriformis muscle would be. Light to moderate palpation does not produce pain. Harder deeper palpation does produce tenderness. Percussion to the bones of the hip and pelvis do not produce pain.  Neurological: He is alert and oriented to person, place, and time.  Skin: Skin is warm and dry.  Psychiatric: He has a normal mood and affect.  Nursing note and vitals reviewed.   Urgent Care Course     Procedures (including critical care time)  Labs Review Labs Reviewed - No data to display  Imaging Review No results found.   Visual Acuity Review  Right Eye Distance:   Left Eye  Distance:   Bilateral Distance:    Right Eye Near:   Left Eye Near:    Bilateral Near:         MDM   1. Muscle strain    Place ice to the area pain of the buttock off and on for the next couple days. Then switch to heat. Take your Tylenol 3 as needed for pain as well as the prednisone as directed. Take that with food. Follow-up with Dr. Timoteo AceFutch's later this week, keep her appointment. Meds ordered this encounter  Medications  . predniSONE (DELTASONE) 50 MG tablet    Sig: 1 tab po daily for 6 days. Take with food.    Dispense:  6 tablet    Refill:  0    Order Specific Question:   Supervising Provider  Answer:   Elvina Sidle [[id number]]       QIHK, VQQVZ, NP 11/14/16 2031

## 2016-11-18 ENCOUNTER — Ambulatory Visit: Payer: Medicaid Other | Admitting: Family Medicine

## 2016-11-28 ENCOUNTER — Other Ambulatory Visit: Payer: Self-pay | Admitting: Family Medicine

## 2016-11-28 DIAGNOSIS — M5440 Lumbago with sciatica, unspecified side: Secondary | ICD-10-CM

## 2016-11-28 DIAGNOSIS — M542 Cervicalgia: Secondary | ICD-10-CM

## 2016-11-28 DIAGNOSIS — M545 Low back pain, unspecified: Secondary | ICD-10-CM

## 2016-11-28 DIAGNOSIS — I10 Essential (primary) hypertension: Secondary | ICD-10-CM

## 2016-11-28 DIAGNOSIS — G8929 Other chronic pain: Secondary | ICD-10-CM

## 2016-11-28 MED FILL — METOPROLOL TARTRATE 50 MG T: 50 | 30 days supply | Qty: 60 | Fill #1

## 2016-11-28 MED FILL — ALL DAY ALLERGY 10 MG TAB: 10 | 30 days supply | Qty: 30 | Fill #1

## 2016-11-28 MED FILL — BUPROPION SR 150 MG TABLET: 150 | 30 days supply | Qty: 60 | Fill #1

## 2016-11-28 MED FILL — CYCLOBENZAPRINE 10 MG TAB: 10 | 20 days supply | Qty: 60 | Fill #1

## 2016-11-29 MED FILL — AMLODIPINE BESYLATE 10 MG T: 10 | 30 days supply | Qty: 30 | Fill #0

## 2016-11-29 MED FILL — TAMSULOSIN HCL 0.4 MG CAP: 0.4 | 30 days supply | Qty: 30 | Fill #0

## 2016-11-29 MED FILL — GABAPENTIN 300 MG CAPSULE: 300 | 30 days supply | Qty: 90 | Fill #0

## 2016-12-01 MED FILL — POTASSIUM CL ER 20 MEQ TAB: 20 | 30 days supply | Qty: 30 | Fill #0

## 2016-12-06 MED FILL — BIDIL TABLET: 20-37.5 | 30 days supply | Qty: 180 | Fill #6

## 2016-12-06 MED FILL — ALLOPURINOL 300 MG TABLET: 300 | 30 days supply | Qty: 30 | Fill #2

## 2016-12-12 ENCOUNTER — Encounter: Payer: Self-pay | Admitting: Family Medicine

## 2016-12-12 ENCOUNTER — Other Ambulatory Visit: Payer: Self-pay

## 2016-12-12 ENCOUNTER — Ambulatory Visit: Payer: Medicaid Other | Attending: Family Medicine | Admitting: Family Medicine

## 2016-12-12 VITALS — BP 118/81 | HR 73 | Temp 97.7°F | Wt 347.6 lb

## 2016-12-12 DIAGNOSIS — F329 Major depressive disorder, single episode, unspecified: Secondary | ICD-10-CM

## 2016-12-12 DIAGNOSIS — R42 Dizziness and giddiness: Secondary | ICD-10-CM | POA: Insufficient documentation

## 2016-12-12 DIAGNOSIS — Z760 Encounter for issue of repeat prescription: Secondary | ICD-10-CM | POA: Insufficient documentation

## 2016-12-12 DIAGNOSIS — G4733 Obstructive sleep apnea (adult) (pediatric): Secondary | ICD-10-CM | POA: Insufficient documentation

## 2016-12-12 DIAGNOSIS — M1000 Idiopathic gout, unspecified site: Secondary | ICD-10-CM

## 2016-12-12 DIAGNOSIS — R200 Anesthesia of skin: Secondary | ICD-10-CM | POA: Diagnosis not present

## 2016-12-12 DIAGNOSIS — I10 Essential (primary) hypertension: Secondary | ICD-10-CM | POA: Diagnosis not present

## 2016-12-12 DIAGNOSIS — M5440 Lumbago with sciatica, unspecified side: Secondary | ICD-10-CM

## 2016-12-12 DIAGNOSIS — G8929 Other chronic pain: Secondary | ICD-10-CM | POA: Diagnosis present

## 2016-12-12 DIAGNOSIS — M542 Cervicalgia: Secondary | ICD-10-CM

## 2016-12-12 DIAGNOSIS — I440 Atrioventricular block, first degree: Secondary | ICD-10-CM | POA: Insufficient documentation

## 2016-12-12 DIAGNOSIS — M545 Low back pain, unspecified: Secondary | ICD-10-CM

## 2016-12-12 DIAGNOSIS — J4520 Mild intermittent asthma, uncomplicated: Secondary | ICD-10-CM | POA: Diagnosis not present

## 2016-12-12 DIAGNOSIS — F32A Depression, unspecified: Secondary | ICD-10-CM

## 2016-12-12 DIAGNOSIS — N183 Chronic kidney disease, stage 3 (moderate): Secondary | ICD-10-CM | POA: Diagnosis not present

## 2016-12-12 DIAGNOSIS — I129 Hypertensive chronic kidney disease with stage 1 through stage 4 chronic kidney disease, or unspecified chronic kidney disease: Secondary | ICD-10-CM | POA: Insufficient documentation

## 2016-12-12 MED ORDER — DICLOFENAC SODIUM 3 % TD GEL: 1.0000 g | Freq: Four times a day (QID) | TRANSDERMAL | 2 refills | Status: DC | PRN

## 2016-12-12 MED ORDER — METOPROLOL TARTRATE 50 MG PO TABS: 50.0000 mg | ORAL_TABLET | Freq: Two times a day (BID) | ORAL | 5 refills | Status: DC

## 2016-12-12 MED ORDER — POTASSIUM CHLORIDE ER 20 MEQ PO TBCR: 1.0000 | EXTENDED_RELEASE_TABLET | Freq: Every day | ORAL | 5 refills | Status: DC

## 2016-12-12 MED ORDER — AMLODIPINE BESYLATE 5 MG PO TABS: 5.0000 mg | ORAL_TABLET | Freq: Every day | ORAL | 5 refills | Status: DC

## 2016-12-12 MED ORDER — CETIRIZINE HCL 10 MG PO TABS: 10.0000 mg | ORAL_TABLET | Freq: Every day | ORAL | 0 refills | Status: DC

## 2016-12-12 MED ORDER — BUPROPION HCL ER (SR) 150 MG PO TB12: 150.0000 mg | ORAL_TABLET | Freq: Two times a day (BID) | ORAL | 5 refills | Status: DC

## 2016-12-12 MED ORDER — ACETAMINOPHEN-CODEINE #3 300-30 MG PO TABS
ORAL_TABLET | ORAL | 2 refills | Status: DC
Start: 2016-12-12 — End: 2017-08-01

## 2016-12-12 MED ORDER — FLUTICASONE-SALMETEROL 100-50 MCG/DOSE IN AEPB: 1.0000 | INHALATION_SPRAY | Freq: Two times a day (BID) | RESPIRATORY_TRACT | 5 refills | Status: DC

## 2016-12-12 MED ORDER — GABAPENTIN 300 MG PO CAPS: 300.0000 mg | ORAL_CAPSULE | Freq: Three times a day (TID) | ORAL | 5 refills | Status: DC

## 2016-12-12 MED ORDER — ISOSORB DINITRATE-HYDRALAZINE 20-37.5 MG PO TABS: 2.0000 | ORAL_TABLET | Freq: Three times a day (TID) | ORAL | 5 refills | Status: DC

## 2016-12-12 MED ORDER — CYCLOBENZAPRINE HCL 10 MG PO TABS: ORAL_TABLET | ORAL | 5 refills | Status: DC

## 2016-12-12 MED ORDER — ALLOPURINOL 300 MG PO TABS: 300.0000 mg | ORAL_TABLET | Freq: Every day | ORAL | 0 refills | Status: DC

## 2016-12-12 MED ORDER — ALBUTEROL SULFATE HFA 108 (90 BASE) MCG/ACT IN AERS: 2.0000 | INHALATION_SPRAY | Freq: Four times a day (QID) | RESPIRATORY_TRACT | 0 refills | Status: DC | PRN

## 2016-12-12 MED ORDER — TAMSULOSIN HCL 0.4 MG PO CAPS: ORAL_CAPSULE | ORAL | 5 refills | Status: DC

## 2016-12-12 MED ORDER — FUROSEMIDE 40 MG PO TABS: 40.0000 mg | ORAL_TABLET | Freq: Every day | ORAL | 5 refills | Status: DC

## 2016-12-12 MED FILL — ADVAIR 100/50 DISKUS: 100-50 | 30 days supply | Qty: 60 | Fill #0

## 2016-12-12 MED FILL — DICLOFENAC SODIUM 3% GEL: 3 | 12 days supply | Qty: 100 | Fill #0

## 2016-12-12 MED FILL — PROVENTIL HFA 108 (90 BASE): 108 (90 BAS | 25 days supply | Qty: 7 | Fill #0

## 2016-12-12 MED FILL — FUROSEMIDE 40 MG TABLET: 40 | 30 days supply | Qty: 30 | Fill #0

## 2016-12-12 MED FILL — AMLODIPINE BESYLATE 5 MG TA: 5 | 30 days supply | Qty: 30 | Fill #0

## 2016-12-12 NOTE — Assessment & Plan Note (Signed)
A: HTN with well controlled BP Med: compliant P: Decrease Norvasc from 10 to 5 mg daily  Continue lasix 40 mg daily Metoprolol 50 mg BID bidil 40-75 mg TID

## 2016-12-12 NOTE — Progress Notes (Signed)
Subjective:  Patient ID: Rickey Calderon, male    DOB: [birth year]  Age: 51 y.o. MRN: [id number]  CC: Medication Refill   HPI Aleksandar Duve has morbid obesity, HTN, OSA on CPAP, CKD stage 3 he presents for    1. Chronic low back pain: pain in low back gets up to 8/10. Pain is non radiating. No numbness. Pain is exacerbated by working. He works night shift as a Sports coach.   He is a non smoker, no THC or drug abuse.   2. Chronic neck pain: 6/10. Pain is non radiating down arms. He does have numbness in both hands times. No weakness in arms or hands.  DG cervical spine 12/11/2015 IMPRESSION: Mild multilevel degenerative disc disease. No acute abnormality seen in the cervical spine.  3. Dizziness: comes and goes. He reports sensation of room spinning. started about 2 months. No  Associated nausea, ringing in ears,  chest pain or shortness of breath. No lightheadedness. He does feel tired. Two night ago he reports sitting on the bed working on his laptop and he tipped over to the left side.   Social History  Substance Use Topics  . Smoking status: Never Smoker  . Smokeless tobacco: Never Used  . Alcohol use No    Outpatient Medications Prior to Visit  Medication Sig Dispense Refill  . acetaminophen-codeine (TYLENOL #3) 300-30 MG tablet TAKE 1 TABLET BY MOUTH EVERY 8 HOURS AS NEEDED FOR MODERATE PAIN 90 tablet 2  . albuterol (PROVENTIL HFA;VENTOLIN HFA) 108 (90 Base) MCG/ACT inhaler Inhale 2 puffs into the lungs every 6 (six) hours as needed for wheezing or shortness of breath. 1 Inhaler 0  . allopurinol (ZYLOPRIM) 300 MG tablet Take 1 tablet (300 mg total) by mouth daily. 30 tablet 0  . amLODipine (NORVASC) 10 MG tablet TAKE 1 TABLET BY MOUTH DAILY. MUST HAVE OFFICE VISIT FOR REFILLS 30 tablet 0  . buPROPion (WELLBUTRIN SR) 150 MG 12 hr tablet Take 1 tablet (150 mg total) by mouth 2 (two) times daily. 60 tablet 2  . cetirizine (ZYRTEC) 10 MG tablet Take 1 tablet (10 mg total) by mouth  daily. 30 tablet 0  . cyclobenzaprine (FLEXERIL) 10 MG tablet TAKE 1 TABLET BY MOUTH 3 TIMES DAILY AS NEEDED FOR MUSCLE SPASMS. 60 tablet 0  . Diclofenac Sodium 3 % GEL Place 1-2 g onto the skin 4 (four) times daily as needed (neck pain). 100 g 2  . Fluticasone-Salmeterol (ADVAIR) 100-50 MCG/DOSE AEPB Inhale 1 puff into the lungs 2 (two) times daily. 60 each 2  . furosemide (LASIX) 40 MG tablet Take 1 tablet (40 mg total) by mouth daily. 30 tablet 0  . gabapentin (NEURONTIN) 300 MG capsule TAKE 1 CAPSULE BY MOUTH 3 TIMES DAILY. 90 capsule 0  . isosorbide-hydrALAZINE (BIDIL) 20-37.5 MG tablet Take 2 tablets by mouth 3 (three) times daily. 180 tablet 0  . metoprolol (LOPRESSOR) 50 MG tablet Take 1 tablet (50 mg total) by mouth 2 (two) times daily. 60 tablet 0  . Potassium Chloride ER 20 MEQ TBCR Take 1 tablet by mouth daily. 30 tablet 0  . tamsulosin (FLOMAX) 0.4 MG CAPS capsule TAKE 1 CAPSULE BY MOUTH DAILY AFTER SUPPER. 30 capsule 0  . predniSONE (DELTASONE) 50 MG tablet 1 tab po daily for 6 days. Take with food. (Patient not taking: Reported on 12/12/2016) 6 tablet 0   No facility-administered medications prior to visit.     ROS Review of Systems  Constitutional: Negative for chills, fatigue, fever  and unexpected weight change.  Eyes: Negative for visual disturbance.  Respiratory: Negative for cough and shortness of breath.   Cardiovascular: Negative for chest pain, palpitations and leg swelling.  Gastrointestinal: Negative for abdominal pain, blood in stool, constipation, diarrhea, nausea and vomiting.  Endocrine: Negative for polydipsia, polyphagia and polyuria.  Musculoskeletal: Positive for back pain and neck pain. Negative for arthralgias, gait problem and myalgias.  Skin: Negative for rash.  Allergic/Immunologic: Negative for immunocompromised state.  Neurological: Positive for dizziness. Negative for light-headedness.  Hematological: Negative for adenopathy. Does not bruise/bleed  easily.  Psychiatric/Behavioral: Negative for dysphoric mood, sleep disturbance and suicidal ideas. The patient is not nervous/anxious.     Objective:  BP 118/81   Pulse 73   Temp 97.7 F (36.5 C) (Oral)   Wt (!) 347 lb 9.6 oz (157.7 kg)   SpO2 96%   BMI 54.44 kg/m   BP/Weight 12/12/2016 9/79/8921 07/05/4172  Systolic BP 081 448 185  Diastolic BP 81 97 84  Wt. (Lbs) 347.6 - 362  BMI 54.44 - 56.7   Orthostatic VS for the past 24 hrs:  BP- Lying Pulse- Lying BP- Sitting Pulse- Sitting BP- Standing at 0 minutes Pulse- Standing at 0 minutes  12/12/16 1230 118/82 56 114/73 66 103/71 68      Physical Exam  Constitutional: He appears well-developed and well-nourished. No distress.  obese  HENT:  Head: Normocephalic and atraumatic.  Right Ear: Tympanic membrane, external ear and ear canal normal.  Left Ear: Tympanic membrane, external ear and ear canal normal.  Neck: Normal range of motion. Neck supple. Carotid bruit is not present.  Cardiovascular: Normal rate, regular rhythm, normal heart sounds and intact distal pulses.   Pulmonary/Chest: Effort normal and breath sounds normal.  Musculoskeletal: He exhibits no edema.  Neurological: He is alert.  Skin: Skin is warm and dry. No rash noted. No erythema.  Psychiatric: He has a normal mood and affect.   EKG: normal EKG, normal sinus rhythm HR 61, unchanged from previous tracings, 1st degree AV block.   Assessment & Plan:  Aamir was seen today for medication refill.  Diagnoses and all orders for this visit:  Chronic midline low back pain, with sciatica presence unspecified -     Drug Screen 8 w/Conf, Ur  Chronic neck pain -     Drug Screen 8 w/Conf, Ur -     acetaminophen-codeine (TYLENOL #3) 300-30 MG tablet; TAKE 1 TABLET BY MOUTH EVERY 8 HOURS AS NEEDED FOR MODERATE PAIN -     cyclobenzaprine (FLEXERIL) 10 MG tablet; TAKE 1 TABLET BY MOUTH 3 TIMES DAILY AS NEEDED FOR MUSCLE SPASMS. -     Diclofenac Sodium 3 % GEL;  Place 1-2 g onto the skin 4 (four) times daily as needed (neck pain).  Dizziness -     EKG 12-Lead -     CBC; Future -     TSH; Future  Essential hypertension -     CMP14+EGFR; Future -     furosemide (LASIX) 40 MG tablet; Take 1 tablet (40 mg total) by mouth daily. -     isosorbide-hydrALAZINE (BIDIL) 20-37.5 MG tablet; Take 2 tablets by mouth 3 (three) times daily. -     metoprolol tartrate (LOPRESSOR) 50 MG tablet; Take 1 tablet (50 mg total) by mouth 2 (two) times daily. -     Potassium Chloride ER 20 MEQ TBCR; Take 1 tablet by mouth daily. -     amLODipine (NORVASC) 5 MG tablet; Take 1  tablet (5 mg total) by mouth daily.  Chronic bilateral low back pain without sciatica -     acetaminophen-codeine (TYLENOL #3) 300-30 MG tablet; TAKE 1 TABLET BY MOUTH EVERY 8 HOURS AS NEEDED FOR MODERATE PAIN  Idiopathic gout, unspecified chronicity, unspecified site -     allopurinol (ZYLOPRIM) 300 MG tablet; Take 1 tablet (300 mg total) by mouth daily.  Mild intermittent asthma without complication -     albuterol (PROVENTIL HFA;VENTOLIN HFA) 108 (90 Base) MCG/ACT inhaler; Inhale 2 puffs into the lungs every 6 (six) hours as needed for wheezing or shortness of breath. -     Fluticasone-Salmeterol (ADVAIR) 100-50 MCG/DOSE AEPB; Inhale 1 puff into the lungs 2 (two) times daily.  Morbid obesity due to excess calories (HCC) -     buPROPion (WELLBUTRIN SR) 150 MG 12 hr tablet; Take 1 tablet (150 mg total) by mouth 2 (two) times daily.  Depression, unspecified depression type -     buPROPion (WELLBUTRIN SR) 150 MG 12 hr tablet; Take 1 tablet (150 mg total) by mouth 2 (two) times daily.  Chronic low back pain, unspecified back pain laterality, with sciatica presence unspecified -     cyclobenzaprine (FLEXERIL) 10 MG tablet; TAKE 1 TABLET BY MOUTH 3 TIMES DAILY AS NEEDED FOR MUSCLE SPASMS.  Chronic low back pain with sciatica, sciatica laterality unspecified, unspecified back pain laterality -      gabapentin (NEURONTIN) 300 MG capsule; Take 1 capsule (300 mg total) by mouth 3 (three) times daily.  Other orders -     cetirizine (ZYRTEC) 10 MG tablet; Take 1 tablet (10 mg total) by mouth daily. -     tamsulosin (FLOMAX) 0.4 MG CAPS capsule; TAKE 1 CAPSULE BY MOUTH DAILY AFTER SUPPER.   There are no diagnoses linked to this encounter.  No orders of the defined types were placed in this encounter.   Follow-up: Return in about 3 weeks (around 01/02/2017) for dizziness.   Boykin Nearing MD

## 2016-12-12 NOTE — Assessment & Plan Note (Signed)
Dizziness with low normal BP Non orthostatic  NSR with 1st degree AV block on EKG

## 2016-12-12 NOTE — Patient Instructions (Addendum)
Reason was seen today for medication refill.  Diagnoses and all orders for this visit:  Chronic midline low back pain, with sciatica presence unspecified -     Drug Screen 8 w/Conf, Ur  Chronic neck pain -     Drug Screen 8 w/Conf, Ur -     acetaminophen-codeine (TYLENOL #3) 300-30 MG tablet; TAKE 1 TABLET BY MOUTH EVERY 8 HOURS AS NEEDED FOR MODERATE PAIN -     cyclobenzaprine (FLEXERIL) 10 MG tablet; TAKE 1 TABLET BY MOUTH 3 TIMES DAILY AS NEEDED FOR MUSCLE SPASMS. -     Diclofenac Sodium 3 % GEL; Place 1-2 g onto the skin 4 (four) times daily as needed (neck pain).  Dizziness -     EKG 12-Lead -     CBC; Future -     TSH; Future  Essential hypertension -     CMP14+EGFR; Future -     furosemide (LASIX) 40 MG tablet; Take 1 tablet (40 mg total) by mouth daily. -     isosorbide-hydrALAZINE (BIDIL) 20-37.5 MG tablet; Take 2 tablets by mouth 3 (three) times daily. -     metoprolol tartrate (LOPRESSOR) 50 MG tablet; Take 1 tablet (50 mg total) by mouth 2 (two) times daily. -     Potassium Chloride ER 20 MEQ TBCR; Take 1 tablet by mouth daily. -     amLODipine (NORVASC) 5 MG tablet; Take 1 tablet (5 mg total) by mouth daily.  Chronic bilateral low back pain without sciatica -     acetaminophen-codeine (TYLENOL #3) 300-30 MG tablet; TAKE 1 TABLET BY MOUTH EVERY 8 HOURS AS NEEDED FOR MODERATE PAIN  Idiopathic gout, unspecified chronicity, unspecified site -     allopurinol (ZYLOPRIM) 300 MG tablet; Take 1 tablet (300 mg total) by mouth daily.  Mild intermittent asthma without complication -     albuterol (PROVENTIL HFA;VENTOLIN HFA) 108 (90 Base) MCG/ACT inhaler; Inhale 2 puffs into the lungs every 6 (six) hours as needed for wheezing or shortness of breath. -     Fluticasone-Salmeterol (ADVAIR) 100-50 MCG/DOSE AEPB; Inhale 1 puff into the lungs 2 (two) times daily.  Morbid obesity due to excess calories (HCC) -     buPROPion (WELLBUTRIN SR) 150 MG 12 hr tablet; Take 1 tablet (150 mg  total) by mouth 2 (two) times daily.  Depression, unspecified depression type -     buPROPion (WELLBUTRIN SR) 150 MG 12 hr tablet; Take 1 tablet (150 mg total) by mouth 2 (two) times daily.  Chronic low back pain, unspecified back pain laterality, with sciatica presence unspecified -     cyclobenzaprine (FLEXERIL) 10 MG tablet; TAKE 1 TABLET BY MOUTH 3 TIMES DAILY AS NEEDED FOR MUSCLE SPASMS.  Chronic low back pain with sciatica, sciatica laterality unspecified, unspecified back pain laterality -     gabapentin (NEURONTIN) 300 MG capsule; Take 1 capsule (300 mg total) by mouth 3 (three) times daily.  Other orders -     cetirizine (ZYRTEC) 10 MG tablet; Take 1 tablet (10 mg total) by mouth daily. -     tamsulosin (FLOMAX) 0.4 MG CAPS capsule; TAKE 1 CAPSULE BY MOUTH DAILY AFTER SUPPER.    Your EKG confirms reduced heart rate of 61 this is low normal and I suspect this is the source of dizziness   Please reduced Norvasc to 5 mg daily to improve heart rate and reduce dizziness   F/u in 3 weeks for dizziness and repeat EGK   Dr. Adrian Blackwater

## 2016-12-12 NOTE — Assessment & Plan Note (Signed)
Chronic pain in obese male X-ray ordered UDS refilled tylenol #3 

## 2016-12-12 NOTE — Assessment & Plan Note (Signed)
Chronic pain in obese male X-ray ordered UDS refilled tylenol #3

## 2016-12-19 ENCOUNTER — Ambulatory Visit: Payer: Medicaid Other | Attending: Family Medicine

## 2016-12-19 DIAGNOSIS — R42 Dizziness and giddiness: Secondary | ICD-10-CM | POA: Diagnosis present

## 2016-12-19 DIAGNOSIS — I10 Essential (primary) hypertension: Secondary | ICD-10-CM | POA: Diagnosis not present

## 2016-12-19 NOTE — Progress Notes (Signed)
Patient here for lab visit only 

## 2016-12-20 LAB — CMP14+EGFR
A/G RATIO: 1.3 (ref 1.2–2.2)
ALT: 18 IU/L (ref 0–44)
AST: 16 IU/L (ref 0–40)
Albumin: 3.6 g/dL (ref 3.5–5.5)
Alkaline Phosphatase: 75 IU/L (ref 39–117)
BUN/Creatinine Ratio: 16 (ref 9–20)
BUN: 23 mg/dL (ref 6–24)
Bilirubin Total: 0.4 mg/dL (ref 0.0–1.2)
CALCIUM: 8.5 mg/dL — AB (ref 8.7–10.2)
CO2: 26 mmol/L (ref 20–29)
Chloride: 99 mmol/L (ref 96–106)
Creatinine, Ser: 1.46 mg/dL — ABNORMAL HIGH (ref 0.76–1.27)
GFR, EST AFRICAN AMERICAN: 63 mL/min/{1.73_m2} (ref >59)
GFR, EST NON AFRICAN AMERICAN: 55 mL/min/{1.73_m2} — AB (ref >59)
GLOBULIN, TOTAL: 2.7 g/dL (ref 1.5–4.5)
Glucose: 124 mg/dL — ABNORMAL HIGH (ref 65–99)
POTASSIUM: 3.8 mmol/L (ref 3.5–5.2)
SODIUM: 140 mmol/L (ref 134–144)
TOTAL PROTEIN: 6.3 g/dL (ref 6.0–8.5)

## 2016-12-20 LAB — CBC
HEMOGLOBIN: 13.5 g/dL (ref 13.0–17.7)
Hematocrit: 43.5 % (ref 37.5–51.0)
MCH: 26.9 pg (ref 26.6–33.0)
MCHC: 31 g/dL — AB (ref 31.5–35.7)
MCV: 87 fL (ref 79–97)
Platelets: 249 10*3/uL (ref 150–379)
RBC: 5.01 x10E6/uL (ref 4.14–5.80)
RDW: 15 % (ref 12.3–15.4)
WBC: 6.6 10*3/uL (ref 3.4–10.8)

## 2016-12-20 LAB — TSH: TSH: 1.49 u[IU]/mL (ref 0.450–4.500)

## 2017-01-03 ENCOUNTER — Other Ambulatory Visit: Payer: Self-pay | Admitting: Family Medicine

## 2017-01-03 MED FILL — BUPROPION SR 150 MG TABLET: 150 | 30 days supply | Qty: 60 | Fill #0

## 2017-01-03 MED FILL — ALLOPURINOL 300 MG TABLET: 300 | 30 days supply | Qty: 30 | Fill #3

## 2017-01-03 MED FILL — BIDIL TABLET: 20-37.5 | 30 days supply | Qty: 180 | Fill #7

## 2017-01-03 MED FILL — ALL DAY ALLERGY 10 MG TAB: 10 | 30 days supply | Qty: 30 | Fill #0

## 2017-01-03 MED FILL — CYCLOBENZAPRINE 10 MG TAB: 10 | 20 days supply | Qty: 60 | Fill #0

## 2017-01-03 MED FILL — METOPROLOL TARTRATE 50 MG T: 50 | 30 days supply | Qty: 60 | Fill #0

## 2017-01-03 MED FILL — TAMSULOSIN HCL 0.4 MG CAP: 0.4 | 30 days supply | Qty: 30 | Fill #0

## 2017-01-05 ENCOUNTER — Other Ambulatory Visit: Payer: Self-pay

## 2017-01-05 ENCOUNTER — Ambulatory Visit: Payer: Medicaid Other | Attending: Family Medicine | Admitting: Family Medicine

## 2017-01-05 ENCOUNTER — Encounter: Payer: Self-pay | Admitting: Family Medicine

## 2017-01-05 VITALS — BP 150/100 | HR 69 | Temp 97.6°F | Ht 67.0 in | Wt 352.4 lb

## 2017-01-05 DIAGNOSIS — G8929 Other chronic pain: Secondary | ICD-10-CM | POA: Diagnosis present

## 2017-01-05 DIAGNOSIS — N183 Chronic kidney disease, stage 3 (moderate): Secondary | ICD-10-CM | POA: Diagnosis not present

## 2017-01-05 DIAGNOSIS — J4520 Mild intermittent asthma, uncomplicated: Secondary | ICD-10-CM

## 2017-01-05 DIAGNOSIS — M545 Low back pain: Secondary | ICD-10-CM | POA: Diagnosis present

## 2017-01-05 DIAGNOSIS — G4733 Obstructive sleep apnea (adult) (pediatric): Secondary | ICD-10-CM | POA: Insufficient documentation

## 2017-01-05 DIAGNOSIS — Z79899 Other long term (current) drug therapy: Secondary | ICD-10-CM | POA: Insufficient documentation

## 2017-01-05 DIAGNOSIS — J302 Other seasonal allergic rhinitis: Secondary | ICD-10-CM | POA: Diagnosis not present

## 2017-01-05 DIAGNOSIS — R42 Dizziness and giddiness: Secondary | ICD-10-CM | POA: Insufficient documentation

## 2017-01-05 DIAGNOSIS — M1000 Idiopathic gout, unspecified site: Secondary | ICD-10-CM | POA: Diagnosis not present

## 2017-01-05 DIAGNOSIS — I129 Hypertensive chronic kidney disease with stage 1 through stage 4 chronic kidney disease, or unspecified chronic kidney disease: Secondary | ICD-10-CM | POA: Insufficient documentation

## 2017-01-05 DIAGNOSIS — I10 Essential (primary) hypertension: Secondary | ICD-10-CM

## 2017-01-05 MED ORDER — ALLOPURINOL 300 MG PO TABS: 300.0000 mg | ORAL_TABLET | Freq: Every day | ORAL | 5 refills | Status: DC

## 2017-01-05 MED ORDER — CETIRIZINE HCL 10 MG PO TABS: 10.0000 mg | ORAL_TABLET | Freq: Every day | ORAL | 5 refills | Status: DC

## 2017-01-05 MED ORDER — PREDNISONE 50 MG PO TABS: 50.0000 mg | ORAL_TABLET | Freq: Every day | ORAL | 0 refills | Status: DC

## 2017-01-05 MED ORDER — ALBUTEROL SULFATE HFA 108 (90 BASE) MCG/ACT IN AERS: 2.0000 | INHALATION_SPRAY | Freq: Four times a day (QID) | RESPIRATORY_TRACT | 5 refills | Status: DC | PRN

## 2017-01-05 MED FILL — predniSONE 10 MG TABS: 10 | 15 days supply | Qty: 75 | Fill #0

## 2017-01-05 MED FILL — PROVENTIL HFA 108 (90 BASE): 108 (90 BAS | 25 days supply | Qty: 7 | Fill #0

## 2017-01-05 NOTE — Progress Notes (Signed)
Subjective:  Patient ID: Rickey Calderon, male    DOB: 08-04-1965  Age: 51 y.o. MRN: [id number]  CC: Hypertension; Back Pain; and Dizziness   HPI Rickey Calderon has morbid obesity, HTN, OSA on CPAP, CKD stage 3 he presents for    1. Chronic low back pain: pain in low back gets up to 10/10. Pain is non radiating. No numbness. Pain is exacerbated by working. He works night shift as a Arboriculturistcustodian.   He is a non smoker, no THC or drug abuse.  L side pain started 2 weeks ago.  Back pain started 2 days ago. He took prednisone that he had leftover, 50 mg and took it for  2 days. He has not taking the flexeril or tylenol #3.   2.  Dizziness: he reports dizziness has resolved. He denies ringing in ears. He dose have R TMJ soreness and feeling of needing to pop his jaw. He has decreased amlodipine from 10 mg to 5 mg daily.   Social History  Substance Use Topics  . Smoking status: Never Smoker  . Smokeless tobacco: Never Used  . Alcohol use No    Outpatient Medications Prior to Visit  Medication Sig Dispense Refill  . acetaminophen-codeine (TYLENOL #3) 300-30 MG tablet TAKE 1 TABLET BY MOUTH EVERY 8 HOURS AS NEEDED FOR MODERATE PAIN 90 tablet 2  . albuterol (PROVENTIL HFA;VENTOLIN HFA) 108 (90 Base) MCG/ACT inhaler Inhale 2 puffs into the lungs every 6 (six) hours as needed for wheezing or shortness of breath. 1 Inhaler 0  . allopurinol (ZYLOPRIM) 300 MG tablet Take 1 tablet (300 mg total) by mouth daily. 30 tablet 0  . amLODipine (NORVASC) 5 MG tablet Take 1 tablet (5 mg total) by mouth daily. 30 tablet 5  . buPROPion (WELLBUTRIN SR) 150 MG 12 hr tablet Take 1 tablet (150 mg total) by mouth 2 (two) times daily. 60 tablet 5  . cetirizine (ZYRTEC) 10 MG tablet Take 1 tablet (10 mg total) by mouth daily. 30 tablet 0  . cyclobenzaprine (FLEXERIL) 10 MG tablet TAKE 1 TABLET BY MOUTH 3 TIMES DAILY AS NEEDED FOR MUSCLE SPASMS. 60 tablet 5  . Diclofenac Sodium 3 % GEL Place 1-2 g onto the skin 4 (four)  times daily as needed (neck pain). 100 g 2  . Fluticasone-Salmeterol (ADVAIR) 100-50 MCG/DOSE AEPB Inhale 1 puff into the lungs 2 (two) times daily. 60 each 5  . furosemide (LASIX) 40 MG tablet Take 1 tablet (40 mg total) by mouth daily. 30 tablet 5  . gabapentin (NEURONTIN) 300 MG capsule Take 1 capsule (300 mg total) by mouth 3 (three) times daily. 90 capsule 5  . isosorbide-hydrALAZINE (BIDIL) 20-37.5 MG tablet Take 2 tablets by mouth 3 (three) times daily. 180 tablet 5  . metoprolol tartrate (LOPRESSOR) 50 MG tablet Take 1 tablet (50 mg total) by mouth 2 (two) times daily. 60 tablet 5  . Potassium Chloride ER 20 MEQ TBCR Take 1 tablet by mouth daily. 30 tablet 5  . tamsulosin (FLOMAX) 0.4 MG CAPS capsule TAKE 1 CAPSULE BY MOUTH DAILY AFTER SUPPER. 30 capsule 5   No facility-administered medications prior to visit.     ROS Review of Systems  Constitutional: Negative for chills, fatigue, fever and unexpected weight change.  Eyes: Negative for visual disturbance.  Respiratory: Negative for cough and shortness of breath.   Cardiovascular: Negative for chest pain, palpitations and leg swelling.  Gastrointestinal: Negative for abdominal pain, blood in stool, constipation, diarrhea, nausea and vomiting.  Endocrine: Negative for polydipsia, polyphagia and polyuria.  Musculoskeletal: Positive for back pain and neck pain. Negative for arthralgias, gait problem and myalgias.  Skin: Negative for rash.  Allergic/Immunologic: Negative for immunocompromised state.  Neurological: Negative for dizziness and light-headedness.  Hematological: Negative for adenopathy. Does not bruise/bleed easily.  Psychiatric/Behavioral: Negative for dysphoric mood, sleep disturbance and suicidal ideas. The patient is not nervous/anxious.     Objective:  BP (!) 150/100   Pulse 69   Temp 97.6 F (36.4 C) (Oral)   Ht 5\' 7"  (1.702 m)   Wt (!) 352 lb 6.4 oz (159.8 kg)   SpO2 96%   BMI 55.19 kg/m   BP/Weight  01/05/2017 12/12/2016 11/14/2016  Systolic BP 150 118 151  Diastolic BP 100 81 97  Wt. (Lbs) 352.4 347.6 -  BMI 55.19 54.44 -   No data found.  Physical Exam  Constitutional: He appears well-developed and well-nourished. No distress.  obese  HENT:  Head: Normocephalic and atraumatic.  Right Ear: Tympanic membrane, external ear and ear canal normal.  Left Ear: Tympanic membrane, external ear and ear canal normal.  Neck: Normal range of motion. Neck supple. Carotid bruit is not present.  Cardiovascular: Normal rate, regular rhythm, normal heart sounds and intact distal pulses.   Pulmonary/Chest: Effort normal and breath sounds normal.  Musculoskeletal: He exhibits no edema.       Lumbar back: He exhibits tenderness.  Neurological: He is alert.  Skin: Skin is warm and dry. No rash noted. No erythema.  Psychiatric: He has a normal mood and affect.   EKG: normal EKG, normal sinus rhythm HR 61, unchanged from previous tracings, 1st degree AV block.   Assessment & Plan:  Rickey Calderon was seen today for hypertension, back pain and dizziness.  Diagnoses and all orders for this visit:  Chronic midline low back pain, with sciatica presence unspecified -     predniSONE (DELTASONE) 50 MG tablet; Take 1 tablet (50 mg total) by mouth daily with breakfast. For 5 days for back pain flare ups, repeat as needed no more every 6 weeks  Idiopathic gout, unspecified chronicity, unspecified site -     allopurinol (ZYLOPRIM) 300 MG tablet; Take 1 tablet (300 mg total) by mouth daily.  Mild intermittent asthma without complication -     albuterol (PROVENTIL HFA;VENTOLIN HFA) 108 (90 Base) MCG/ACT inhaler; Inhale 2 puffs into the lungs every 6 (six) hours as needed for wheezing or shortness of breath.  Seasonal allergic rhinitis, unspecified trigger -     cetirizine (ZYRTEC) 10 MG tablet; Take 1 tablet (10 mg total) by mouth daily.   There are no diagnoses linked to this encounter.  No orders of the  defined types were placed in this encounter.   Follow-up: Return in about 6 weeks (around 02/16/2017) for BP check and back pain .   Dessa Phi MD

## 2017-01-05 NOTE — Patient Instructions (Addendum)
Diagnoses and all orders for this visit:  Chronic midline low back pain, with sciatica presence unspecified -     predniSONE (DELTASONE) 50 MG tablet; Take 1 tablet (50 mg total) by mouth daily with breakfast. For 5 days for back pain flare ups, repeat as needed no more every 6 weeks  Idiopathic gout, unspecified chronicity, unspecified site -     allopurinol (ZYLOPRIM) 300 MG tablet; Take 1 tablet (300 mg total) by mouth daily.  Mild intermittent asthma without complication -     albuterol (PROVENTIL HFA;VENTOLIN HFA) 108 (90 Base) MCG/ACT inhaler; Inhale 2 puffs into the lungs every 6 (six) hours as needed for wheezing or shortness of breath.  Seasonal allergic rhinitis, unspecified trigger -     cetirizine (ZYRTEC) 10 MG tablet; Take 1 tablet (10 mg total) by mouth daily.   You can take flexeril with prednisone No need to take ibuprofen with  flexeril  F/u in 6 weeks for BP recheck and back pain   Dr. Armen PickupFunches

## 2017-01-08 NOTE — Assessment & Plan Note (Signed)
Resolved since stopping norvasc

## 2017-01-08 NOTE — Assessment & Plan Note (Signed)
A: elevated since reducing norvasc to 5 mg daily P: Low salt diet  Weight reduction Continue Norvasc 5 mg daily, lasix 40 mg daily, metoprolol 50 mg BID and bidili 40-75 mg TID

## 2017-01-08 NOTE — Assessment & Plan Note (Signed)
Chronic low back pain in obese patient Recent flare up Plan: 3 more days of prednisone 50 mg daily for total 5 day course Advise weight loss

## 2017-02-06 MED FILL — POTASSIUM CL ER 20 MEQ TAB: 20 | 30 days supply | Qty: 30 | Fill #0

## 2017-02-06 MED FILL — TAMSULOSIN HCL 0.4 MG CAP: 0.4 | 30 days supply | Qty: 30 | Fill #1

## 2017-02-06 MED FILL — METOPROLOL TARTRATE 50 MG T: 50 | 30 days supply | Qty: 60 | Fill #1

## 2017-02-06 MED FILL — BUPROPION SR 150 MG TABLET: 150 | 30 days supply | Qty: 60 | Fill #1

## 2017-02-06 MED FILL — ALL DAY ALLERGY 10 MG TAB: 10 | 30 days supply | Qty: 30 | Fill #0

## 2017-02-06 MED FILL — FUROSEMIDE 40 MG TABLET: 40 | 30 days supply | Qty: 30 | Fill #1

## 2017-02-06 MED FILL — ALLOPURINOL 300 MG TABLET: 300 | 30 days supply | Qty: 30 | Fill #0

## 2017-02-06 MED FILL — BIDIL TABLET: 20-37.5 | 30 days supply | Qty: 180 | Fill #8

## 2017-02-06 MED FILL — AMLODIPINE BESYLATE 5 MG TA: 5 | 30 days supply | Qty: 30 | Fill #1

## 2017-02-06 MED FILL — CYCLOBENZAPRINE 10 MG TAB: 10 | 20 days supply | Qty: 60 | Fill #1

## 2017-02-28 ENCOUNTER — Encounter: Payer: Self-pay | Admitting: Internal Medicine

## 2017-02-28 ENCOUNTER — Ambulatory Visit: Payer: Medicaid Other | Attending: Internal Medicine | Admitting: Internal Medicine

## 2017-02-28 ENCOUNTER — Ambulatory Visit (HOSPITAL_COMMUNITY)
Admission: RE | Admit: 2017-02-28 | Discharge: 2017-02-28 | Disposition: A | Discharge status: Home / Self Care | Payer: Medicaid Other | Source: Ambulatory Visit | Attending: Internal Medicine | Admitting: Internal Medicine

## 2017-02-28 VITALS — BP 163/112 | HR 66 | Temp 97.5°F | Ht 67.0 in | Wt 350.8 lb

## 2017-02-28 DIAGNOSIS — M47816 Spondylosis without myelopathy or radiculopathy, lumbar region: Secondary | ICD-10-CM | POA: Insufficient documentation

## 2017-02-28 DIAGNOSIS — J449 Chronic obstructive pulmonary disease, unspecified: Secondary | ICD-10-CM | POA: Diagnosis not present

## 2017-02-28 DIAGNOSIS — G8929 Other chronic pain: Secondary | ICD-10-CM | POA: Insufficient documentation

## 2017-02-28 DIAGNOSIS — N183 Chronic kidney disease, stage 3 unspecified: Secondary | ICD-10-CM

## 2017-02-28 DIAGNOSIS — Z23 Encounter for immunization: Secondary | ICD-10-CM | POA: Diagnosis not present

## 2017-02-28 DIAGNOSIS — M109 Gout, unspecified: Secondary | ICD-10-CM | POA: Diagnosis not present

## 2017-02-28 DIAGNOSIS — M545 Low back pain: Secondary | ICD-10-CM | POA: Insufficient documentation

## 2017-02-28 DIAGNOSIS — M5136 Other intervertebral disc degeneration, lumbar region: Secondary | ICD-10-CM | POA: Diagnosis not present

## 2017-02-28 DIAGNOSIS — Z1211 Encounter for screening for malignant neoplasm of colon: Secondary | ICD-10-CM | POA: Diagnosis not present

## 2017-02-28 DIAGNOSIS — N411 Chronic prostatitis: Secondary | ICD-10-CM | POA: Diagnosis not present

## 2017-02-28 DIAGNOSIS — R05 Cough: Secondary | ICD-10-CM

## 2017-02-28 DIAGNOSIS — Z79899 Other long term (current) drug therapy: Secondary | ICD-10-CM | POA: Insufficient documentation

## 2017-02-28 DIAGNOSIS — G4733 Obstructive sleep apnea (adult) (pediatric): Secondary | ICD-10-CM

## 2017-02-28 DIAGNOSIS — I130 Hypertensive heart and chronic kidney disease with heart failure and stage 1 through stage 4 chronic kidney disease, or unspecified chronic kidney disease: Secondary | ICD-10-CM | POA: Insufficient documentation

## 2017-02-28 DIAGNOSIS — I10 Essential (primary) hypertension: Secondary | ICD-10-CM | POA: Diagnosis not present

## 2017-02-28 DIAGNOSIS — Z6841 Body Mass Index (BMI) 40.0 and over, adult: Secondary | ICD-10-CM | POA: Diagnosis not present

## 2017-02-28 DIAGNOSIS — F329 Major depressive disorder, single episode, unspecified: Secondary | ICD-10-CM | POA: Diagnosis not present

## 2017-02-28 DIAGNOSIS — R059 Cough, unspecified: Secondary | ICD-10-CM

## 2017-02-28 MED ORDER — AMLODIPINE BESYLATE 5 MG PO TABS: 7.5000 mg | ORAL_TABLET | Freq: Every day | ORAL | 5 refills | Status: DC

## 2017-02-28 MED ORDER — FLUTICASONE PROPIONATE 50 MCG/ACT NA SUSP: 1.0000 | Freq: Every day | NASAL | 6 refills | Status: DC

## 2017-02-28 MED FILL — FLUTICASONE PROP 50 MCG SPR: 50 | 30 days supply | Qty: 16 | Fill #0

## 2017-02-28 MED FILL — AMLODIPINE BESYLATE 5 MG TA: 5 | 30 days supply | Qty: 45 | Fill #0

## 2017-02-28 NOTE — Progress Notes (Signed)
Patient ID: Rickey Calderon, male    DOB: 01-23-1966  MRN: [id number]  CC: No chief complaint on file.   Subjective: Rickey Calderon is a 51 y.o. male who presents for chronic ds management. Last saw Funches 12/2016 His concerns today include:  Pt with hx of HTN, OSA on BiPAP, CKD 3, gout, asthma (mild intermittent), chronic LBP (on Tylenol #3, Flexeril and Gabapentin), obesity, depression, cardiomyopathy with EF inc from 25% to 45-50% 04/2016 (thought to be due to HTN. 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 "Flare up of sciatica happening more." -given Prednisone 50 mg x 5 days on last visit. Helped until 1 wk ago when he started having flare again.  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  - pain mainly RT lower back. No shooting pains or numbness/tingling in RT leg.  -hard to get comfortable at nights.  -on Flexeril, Gabapentin and Tylenol #3s.  On Gabapentin more so for nerve pain in RT leg post embolectomy 07/2014 Takes Flexeril only as needed. Takes Tylenol #3 when he is not taking Prednisone.  "I have high tolerance.  Sometimes it works and sometimes it does not."  -no drowsiness or constipation from Tylenol #3. -takes Aleve OTC  2. OSA on Bipap: using consistently Sleep study 04/2016   3. C/o persistent cough x 8 mths.  "I always have to clear my throat." -sometimes dry, other times clear phlegm.  -no SOB or fever.  Not worse at any particular time of the day -no itchy throat. Feeling of drainage -using Advair inconsistently. Uses Albuterol about 2 x a mth. Takes Zyrtec daily No heart burn/indigestion -last seen by Pulmonary Dr. Robyne Askew from [facility] 04/2016. Had PFTs and ABG, showed mild COPD and elevated CO2 at 48. Was started on Advair at that time   4. HTN: just took BP meds -does not use salt -no device to check BP -no  CP/SOB/intermittent LE edema. Not taking Furosemide on regular bases due to work schedule. Work nights and sleep during the day. Feels it interferes with his sleep if he takes it during the day and interferes with his ability to work effectively he takes it at nights.  5. Obesity: -"eating habits can be better." Not consistent with limiting portion sizes. Eats in a.m when he gets off work then gets in bed. Does a lot of walking on job.   Patient Active Problem List   Diagnosis Date Noted  . Hypertensive cardiomyopathy, without heart failure (HCC) 05/17/2016  . 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 Exertional dyspnea 04/22/2016  . Obesity hypoventilation syndrome (HCC) 04/22/2016  . CKD (chronic kidney disease) stage 3, GFR 30-59 ml/min 02/02/2016  . Chronic combined systolic and diastolic heart failure, NYHA class 2 (HCC) 09/29/2015  . Chronic neck pain 09/21/2015  . Asthma   . Depression 07/10/2015  . Financial difficulties 04/30/2015  . Morbid obesity (HCC) 03/31/2015  . Chronic low back pain 03/31/2015  . OSA on CPAP 07/25/2014  . Essential hypertension   . Gout   . Seasonal allergies   . Chronic prostatitis      Current Outpatient Prescriptions on File Prior to Visit  Medication Sig Dispense Refill  . albuterol (PROVENTIL HFA;VENTOLIN HFA) 108 (90 Base) MCG/ACT inhaler Inhale 2 puffs into the lungs every 6 (six) hours as needed for wheezing or shortness of breath.  1 Inhaler 5  . allopurinol (ZYLOPRIM) 300 MG tablet Take 1 tablet (300 mg total) by mouth daily. 30 tablet 5  . buPROPion (WELLBUTRIN SR) 150 MG 12 hr tablet Take 1 tablet (150 mg total) by mouth 2 (two) times daily. 60 tablet 5  . cetirizine (ZYRTEC) 10 MG tablet Take 1 tablet (10 mg total) by mouth daily. 30 tablet 5  . cyclobenzaprine (FLEXERIL) 10 MG tablet TAKE 1 TABLET BY MOUTH 3 TIMES DAILY AS NEEDED FOR MUSCLE SPASMS. 60 tablet 5  . Diclofenac Sodium 3 % GEL Place 1-2 g onto  the skin 4 (four) times daily as needed (neck pain). 100 g 2  . Fluticasone-Salmeterol (ADVAIR) 100-50 MCG/DOSE AEPB Inhale 1 puff into the lungs 2 (two) times daily. 60 each 5  . furosemide (LASIX) 40 MG tablet Take 1 tablet (40 mg total) by mouth daily. 30 tablet 5  . gabapentin (NEURONTIN) 300 MG capsule Take 1 capsule (300 mg total) by mouth 3 (three) times daily. 90 capsule 5  . isosorbide-hydrALAZINE (BIDIL) 20-37.5 MG tablet Take 2 tablets by mouth 3 (three) times daily. 180 tablet 5  . metoprolol tartrate (LOPRESSOR) 50 MG tablet Take 1 tablet (50 mg total) by mouth 2 (two) times daily. 60 tablet 5  . Potassium Chloride ER 20 MEQ TBCR Take 1 tablet by mouth daily. 30 tablet 5  . predniSONE (DELTASONE) 50 MG tablet Take 1 tablet (50 mg total) by mouth daily with breakfast. For 5 days for back pain flare ups, repeat as needed no more every 6 weeks 15 tablet 0  . tamsulosin (FLOMAX) 0.4 MG CAPS capsule TAKE 1 CAPSULE BY MOUTH DAILY AFTER SUPPER. 30 capsule 5  . acetaminophen-codeine (TYLENOL #3) 300-30 MG tablet TAKE 1 TABLET BY MOUTH EVERY 8 HOURS AS NEEDED FOR MODERATE PAIN (Patient not taking: Reported on 02/28/2017) 90 tablet 2   No current facility-administered medications on file prior to visit.     No Known Allergies  Social History   Social History  . Marital status: Married    Spouse name: N/A  . Number of children: N/A  . Years of education: N/A   Occupational History  . Not on file.   Social History Main Topics  . Smoking status: Never Smoker  . Smokeless tobacco: Never Used  . Alcohol use No  . Drug use: No  . Sexual activity: Not on file   Other Topics Concern  . Not on file   Social History Narrative  . No narrative on file    Family History  Problem Relation Age of Onset  . Diabetes Father   . Hyperlipidemia Father     Past Surgical History:  Procedure Laterality Date  . ABDOMINAL AORTAGRAM N/A 07/28/2014   Procedure: ABDOMINAL Ronny FlurryAORTAGRAM;  Surgeon:  Chuck Hinthristopher S Dickson, MD;  [facility];  Service: Cardiovascular;  Laterality: N/A;  . LOWER EXTREMITY ANGIOGRAM Bilateral 07/28/2014   Procedure: LOWER EXTREMITY ANGIOGRAM;  Surgeon: Chuck Hinthristopher S Dickson, MD;  [facility];  Service: Cardiovascular;  Laterality: Bilateral;  . THROMBECTOMY FEMORAL ARTERY Right 08/01/2014   Procedure: THROMBECTOMY RIGHT LEG SFA, Anterior tibial, and perineal arteries with intraoperative arteriograms;  Surgeon: Nada LibmanVance W Brabham, MD;  Location: [facility] OR;  Service: Vascular;  Laterality: Right;    ROS: Review of Systems negative except as stated above PHYSICAL EXAM: BP (!) 163/112   Pulse 66   Temp (!) 97.5 F (36.4 C) (Oral)   Ht 5\' 7"  (1.702 m)  Wt (!) 350 lb 12.8 oz (159.1 kg)   SpO2 96%   BMI 54.94 kg/m   Repeat 138/110  Wt Readings from Last 3 Encounters:  02/28/17 (!) 350 lb 12.8 oz (159.1 kg)  01/05/17 (!) 352 lb 6.4 oz (159.8 kg)  12/12/16 (!) 347 lb 9.6 oz (157.7 kg)   Physical Exam General appearance - alert, well appearing, morbidly obese middle-age African-American male and in no distress Mental status - alert, oriented to person, place, and time, normal mood, behavior, speech, dress, motor activity, and thought processes Nose - normal and patent, no erythema, discharge or polyps Mouth - mucous membranes moist, pharynx normal without lesions Neck - supple, no significant adenopathy Chest - clear to auscultation, no wheezes, rales or rhonchi, symmetric air entry Heart - normal rate, regular rhythm, normal S1, S2, no murmurs, rubs, clicks or gallops Musculoskeletal -no tenderness on palpation of LS spine or surrounding paraspinal muscles. Straight leg raise negative Extremities - trace lower extremity edema bilaterally  Depression screen [facility] 2/9 01/05/2017 03/24/2016 02/12/2016 02/12/2016 01/11/2016  Decreased Interest 0 1 0 0 0  Down, Depressed, Hopeless 1 1 1  0 0  PHQ - 2 Score 1 2 1  0 0  Altered sleeping 1 1 1  - -  Tired,  decreased energy 1 0 0 - -  Change in appetite 1 1 0 - -  Feeling bad or failure about yourself  1 1 0 - -  Trouble concentrating 0 0 1 - -  Moving slowly or fidgety/restless 0 0 0 - -  Suicidal thoughts 0 0 0 - -  PHQ-9 Score 5 5 3  - -    ASSESSMENT AND PLAN: 1. Chronic right-sided low back pain without sciatica -History does not suggest sciatica. Likely DJD or DDD of lumbar spine or sacral iliac joint - DG Lumbar Spine Complete; Future - Ambulatory referral to Physical Therapy -Continue Flexeril and Tylenol with codeine as needed. Denies any side effects from the medication Controlled substance agreement updated 6/208/2098. Last prescription for Tylenol No. 3 written 11/2016. The prescription it appears has not been filled as yet  2. Essential hypertension -Not at goal. Patient had taken medications for 20-30 minutes before I saw him. -Continue low-salt diet Increase amlodipine from 5-7.5 mg daily.  Last attempt to increase to 10 mg caused dizziness for him - amLODipine (NORVASC) 5 MG tablet; Take 1.5 tablets (7.5 mg total) by mouth daily.  Dispense: 45 tablet; Refill: 5  3. OSA treated with BiPAP + OHS -cont Bipap  4. CKD (chronic kidney disease) stage 3, GFR 30-59 ml/min -Advised against use of NSAIDs.  -Discussed the importance of blood pressure control to prevent further decline in kidney function  5. Morbid obesity due to excess calories (HCC) -Patient has set goal to decrease portion sizes more consistently  6. Need for influenza vaccination - Flu Vaccine QUAD 6+ mos PF IM (Fluarix Quad PF)  7. Colon cancer screening - Ambulatory referral to Gastroenterology  8. Cough in adult -Patient with mild COPD on PFTs done 04/2016. However, suggested of postnasal drip. Continue Zyrtec. Add Flonase nasal spray. - fluticasone (FLONASE) 50 MCG/ACT nasal spray; Place 1 spray into both nostrils daily.  Dispense: 16 g; Refill: 6 Follow-up in 2 months to see if the cough and back  pain have improved    Patient was given the opportunity to ask questions.  Patient verbalized understanding of the plan and was able to repeat key elements of the plan.   Orders Placed This  Encounter  Procedures  . DG Lumbar Spine Complete  . Flu Vaccine QUAD 6+ mos PF IM (Fluarix Quad PF)  . Ambulatory referral to Physical Therapy  . Ambulatory referral to Gastroenterology     Requested Prescriptions   Signed Prescriptions Disp Refills  . amLODipine (NORVASC) 5 MG tablet 45 tablet 5    Sig: Take 1.5 tablets (7.5 mg total) by mouth daily.  . fluticasone (FLONASE) 50 MCG/ACT nasal spray 16 g 6    Sig: Place 1 spray into both nostrils daily.    Return in about 6 weeks (around 04/11/2017).  Jonah Blue, MD, FACP

## 2017-02-28 NOTE — Patient Instructions (Addendum)
Stop taking Aleve or other over-the-counter medications as these can affect your kidney function.  Increase amlodipine 5 mg to 7.5 mg.  this means he will take 1-1/2 of the 5 mg tablet.  You have been referred for physical therapy and an x-ray of your lower back.  Continue the Zyrtec. We have added Flonase nasal spray to see if it will help with her chronic cough.  Follow a Healthy Eating Plan - You can do it! Limit sugary drinks.  Avoid sodas, sweet tea, sport or energy drinks, or fruit drinks.  Drink water, lo-fat milk, or diet drinks. Limit snack foods.   Cut back on candy, cake, cookies, chips, ice cream.  These are a special treat, only in small amounts. Eat plenty of vegetables.  Especially dark green, red, and orange vegetables. Aim for at least 3 servings a day. More is better! Include fruit in your daily diet.  Whole fruit is much healthier than fruit juice! Limit "white" bread, "white" pasta, "white" rice.   Choose "100% whole grain" products, brown or wild rice. Avoid fatty meats. Try "Meatless Monday" and choose eggs or beans one day a week.  When eating meat, choose lean meats like chicken, [place]key, and fish.  Grill, broil, or bake meats instead of frying, and eat poultry without the skin. Eat less salt.  Avoid frozen pizzas, frozen dinners and salty foods.  Use seasonings other than salt in cooking.  This can help blood pressure and keep you from swelling Beer, wine and liquor have calories.  If you can safely drink alcohol, limit to 1 drink per day for women, 2 drinks for men

## 2017-02-28 NOTE — Progress Notes (Signed)
Pt has not taken bp medication today.

## 2017-03-01 ENCOUNTER — Emergency Department (HOSPITAL_COMMUNITY)
Admission: EM | Admit: 2017-03-01 | Discharge: 2017-03-01 | Disposition: A | Discharge status: Home / Self Care | Payer: Medicaid Other | Attending: Emergency Medicine | Admitting: Emergency Medicine

## 2017-03-01 ENCOUNTER — Encounter (HOSPITAL_COMMUNITY): Payer: Self-pay | Admitting: *Deleted

## 2017-03-01 ENCOUNTER — Emergency Department (HOSPITAL_COMMUNITY): Payer: Medicaid Other

## 2017-03-01 DIAGNOSIS — Z86718 Personal history of other venous thrombosis and embolism: Secondary | ICD-10-CM | POA: Insufficient documentation

## 2017-03-01 DIAGNOSIS — I5042 Chronic combined systolic (congestive) and diastolic (congestive) heart failure: Secondary | ICD-10-CM | POA: Diagnosis not present

## 2017-03-01 DIAGNOSIS — M25552 Pain in left hip: Secondary | ICD-10-CM | POA: Diagnosis not present

## 2017-03-01 DIAGNOSIS — M25559 Pain in unspecified hip: Secondary | ICD-10-CM

## 2017-03-01 DIAGNOSIS — M5442 Lumbago with sciatica, left side: Secondary | ICD-10-CM | POA: Insufficient documentation

## 2017-03-01 DIAGNOSIS — Z79899 Other long term (current) drug therapy: Secondary | ICD-10-CM | POA: Diagnosis not present

## 2017-03-01 DIAGNOSIS — N183 Chronic kidney disease, stage 3 (moderate): Secondary | ICD-10-CM | POA: Diagnosis not present

## 2017-03-01 DIAGNOSIS — I130 Hypertensive heart and chronic kidney disease with heart failure and stage 1 through stage 4 chronic kidney disease, or unspecified chronic kidney disease: Secondary | ICD-10-CM | POA: Insufficient documentation

## 2017-03-01 DIAGNOSIS — M545 Low back pain: Secondary | ICD-10-CM | POA: Diagnosis present

## 2017-03-01 MED ORDER — OXYCODONE-ACETAMINOPHEN 5-325 MG PO TABS
1.0000 | ORAL_TABLET | Freq: Once | ORAL | Status: AC
  Administered 2017-03-01: 1 via ORAL
  Filled 2017-03-01: qty 1

## 2017-03-01 NOTE — Discharge Instructions (Signed)
Continue all current medications. Try stretching, heating pads, massage. Call your doctor if not improving in 3-5 days. Return if numbness or weakness in extremities, unable to control bladder or bowels, fever, any other concerning symptoms.

## 2017-03-01 NOTE — ED Notes (Signed)
Patient transported to X-ray 

## 2017-03-01 NOTE — ED Triage Notes (Signed)
Pt in via [facility], per report pt c/o L lower back pain, pt evaluated yesterday for the same, pt had xray completed yesterday per pt, pt denies urine & bowel incontinence, pt MAE, denies new injury, A&O x4

## 2017-03-01 NOTE — ED Provider Notes (Signed)
[facility]-EMERGENCY DEPT Provider Note   CSN: [id number] Arrival date & time: 03/01/17  1550     History   Chief Complaint No chief complaint on file.   HPI Rickey Calderon is a 51 y.o. male.  HPI Rickey Calderon is a 51 y.o. male with history of CHF, asthma, obesity, hypertension, femoral thrombosis, presents to ED with complaint of back pain. Pt states he has hx of arthritis and sciatica. States in the last three days has had increased pain in the left lower back. States this pain radiates into the buttock. It does not go below the hip. Denies numbness or weakness to the leg or foot. Denies any injuries. He went and saw his family doctor yesterday, he was prescribed prednisone. He states he currently taking Flexeril, Advil, prednisone, Neurontin for his pain. He is under a pain contract with his family doctor and is prescribed Tylenol No. 3. He told me that he has not been taking it because he does not have any funds to get this medication filled at the pharmacy. Patient denies any abdominal pain. He reports similar pain in the past, states it improved with prednisone. He states the pain is worse with any movement, having trouble sleeping, having trouble ambulating.  Past Medical History:  Diagnosis Date  . Asthma   . Bronchitis   . CHF (congestive heart failure) (HCC)    EF 30% by echo,  EF 50% Nov 2017  . Depression   . Femoral thrombosis (HCC)   . Gout   . Hypertension   . Morbid obesity (HCC)   . Prostatitis   . Seasonal allergies   . Sleep apnea    BiPAP    Patient Active Problem List   Diagnosis Date Noted  . Hypertensive cardiomyopathy, without heart failure (HCC) 05/17/2016  . 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 Exertional dyspnea 04/22/2016  . Obesity hypoventilation syndrome (HCC) 04/22/2016  . CKD (chronic kidney disease) stage 3, GFR 30-59 ml/min 02/02/2016  . Chronic combined systolic and diastolic heart failure, NYHA class 2 (HCC)  09/29/2015  . Chronic neck pain 09/21/2015  . Asthma   . Depression 07/10/2015  . Financial difficulties 04/30/2015  . Morbid obesity (HCC) 03/31/2015  . Chronic low back pain 03/31/2015  . OSA on CPAP 07/25/2014  . Essential hypertension   . Gout   . Seasonal allergies   . Chronic prostatitis     Past Surgical History:  Procedure Laterality Date  . ABDOMINAL AORTAGRAM N/A 07/28/2014   Procedure: ABDOMINAL Ronny Flurry;  Surgeon: Chuck Hint, MD;  Location: [facility] CATH LAB;  Service: Cardiovascular;  Laterality: N/A;  . LOWER EXTREMITY ANGIOGRAM Bilateral 07/28/2014   Procedure: LOWER EXTREMITY ANGIOGRAM;  Surgeon: Chuck Hint, MD;  Location: [facility] CATH LAB;  Service: Cardiovascular;  Laterality: Bilateral;  . THROMBECTOMY FEMORAL ARTERY Right 08/01/2014   Procedure: THROMBECTOMY RIGHT LEG SFA, Anterior tibial, and perineal arteries with intraoperative arteriograms;  Surgeon: Nada Libman, MD;  Location: [facility] OR;  Service: Vascular;  Laterality: Right;       Home Medications    Prior to Admission medications   Medication Sig Start Date End Date Taking? Authorizing Provider  acetaminophen-codeine (TYLENOL #3) 300-30 MG tablet TAKE 1 TABLET BY MOUTH EVERY 8 HOURS AS NEEDED FOR MODERATE PAIN Patient not taking: Reported on 02/28/2017 12/12/16   Dessa Phi, MD  albuterol (PROVENTIL HFA;VENTOLIN HFA) 108 (90 Base) MCG/ACT inhaler Inhale 2 puffs into the lungs every 6 (six) hours as  needed for wheezing or shortness of breath. 01/05/17   Funches, Gerilyn Nestle, MD  allopurinol (ZYLOPRIM) 300 MG tablet Take 1 tablet (300 mg total) by mouth daily. 01/05/17   Funches, Gerilyn Nestle, MD  amLODipine (NORVASC) 5 MG tablet Take 1.5 tablets (7.5 mg total) by mouth daily. 02/28/17   Marcine Matar, MD  buPROPion (WELLBUTRIN SR) 150 MG 12 hr tablet Take 1 tablet (150 mg total) by mouth 2 (two) times daily. 12/12/16   Funches, Gerilyn Nestle, MD  cetirizine (ZYRTEC) 10 MG tablet Take 1 tablet (10 mg total) by  mouth daily. 01/05/17   Funches, Gerilyn Nestle, MD  cyclobenzaprine (FLEXERIL) 10 MG tablet TAKE 1 TABLET BY MOUTH 3 TIMES DAILY AS NEEDED FOR MUSCLE SPASMS. 12/12/16   Funches, Gerilyn Nestle, MD  Diclofenac Sodium 3 % GEL Place 1-2 g onto the skin 4 (four) times daily as needed (neck pain). 12/12/16   Funches, Gerilyn Nestle, MD  fluticasone (FLONASE) 50 MCG/ACT nasal spray Place 1 spray into both nostrils daily. 02/28/17   Marcine Matar, MD  Fluticasone-Salmeterol (ADVAIR) 100-50 MCG/DOSE AEPB Inhale 1 puff into the lungs 2 (two) times daily. 12/12/16   Funches, Gerilyn Nestle, MD  furosemide (LASIX) 40 MG tablet Take 1 tablet (40 mg total) by mouth daily. 12/12/16   Funches, Gerilyn Nestle, MD  gabapentin (NEURONTIN) 300 MG capsule Take 1 capsule (300 mg total) by mouth 3 (three) times daily. 12/12/16   Funches, Gerilyn Nestle, MD  isosorbide-hydrALAZINE (BIDIL) 20-37.5 MG tablet Take 2 tablets by mouth 3 (three) times daily. 12/12/16   Funches, Gerilyn Nestle, MD  metoprolol tartrate (LOPRESSOR) 50 MG tablet Take 1 tablet (50 mg total) by mouth 2 (two) times daily. 12/12/16   Funches, Gerilyn Nestle, MD  Potassium Chloride ER 20 MEQ TBCR Take 1 tablet by mouth daily. 12/12/16   Funches, Gerilyn Nestle, MD  predniSONE (DELTASONE) 50 MG tablet Take 1 tablet (50 mg total) by mouth daily with breakfast. For 5 days for back pain flare ups, repeat as needed no more every 6 weeks 01/05/17   Dessa Phi, MD  tamsulosin (FLOMAX) 0.4 MG CAPS capsule TAKE 1 CAPSULE BY MOUTH DAILY AFTER SUPPER. 12/12/16   Dessa Phi, MD    Family History Family History  Problem Relation Age of Onset  . Diabetes Father   . Hyperlipidemia Father     Social History Social History  Substance Use Topics  . Smoking status: Never Smoker  . Smokeless tobacco: Never Used  . Alcohol use No     Allergies   Patient has no known allergies.   Review of Systems Review of Systems  Constitutional: Negative for chills and fever.  Respiratory: Negative for cough, chest tightness  and shortness of breath.   Cardiovascular: Negative for chest pain, palpitations and leg swelling.  Gastrointestinal: Negative for abdominal distention, abdominal pain, diarrhea, nausea and vomiting.  Genitourinary: Negative for dysuria, frequency, hematuria and urgency.  Musculoskeletal: Positive for arthralgias, back pain and myalgias. Negative for neck pain and neck stiffness.  Skin: Negative for rash.  Allergic/Immunologic: Negative for immunocompromised state.  Neurological: Negative for dizziness, weakness, light-headedness, numbness and headaches.  All other systems reviewed and are negative.    Physical Exam Updated Vital Signs BP (!) 162/100 (BP Location: Left Arm)   Pulse 64   Temp 97.6 F (36.4 C) (Oral)   Resp 19   Ht 5\' 7"  (1.702 m)   Wt (!) 158.8 kg (350 lb)   SpO2 99%   BMI 54.82 kg/m   Physical Exam  Constitutional: He appears well-developed  and well-nourished. No distress.  HENT:  Head: Normocephalic and atraumatic.  Eyes: Conjunctivae are normal.  Neck: Neck supple.  Cardiovascular: Normal rate, regular rhythm and normal heart sounds.   Pulmonary/Chest: Effort normal. No respiratory distress. He has no wheezes. He has no rales.  Abdominal: Soft. Bowel sounds are normal. He exhibits no distension. There is no tenderness. There is no rebound.  Musculoskeletal: He exhibits no edema.  No midline lumbar spine tenderness. Tender to palpation of left SI joint. Full range of motion of the left hip joint. No pain with left straight leg raise. Dorsal pedal pulses intact.  Neurological: He is alert.  5/5 and equal lower extremity strength. 2+ and equal patellar reflexes bilaterally. Pt able to dorsiflex bilateral toes and feet with good strength against resistance. Equal sensation bilaterally over thighs and lower legs.   Skin: Skin is warm and dry.  Nursing note and vitals reviewed.    ED Treatments / Results  Labs (all labs ordered are listed, but only abnormal  results are displayed) Labs Reviewed - No data to display  EKG  EKG Interpretation None       Radiology Dg Lumbar Spine Complete  Result Date: 02/28/2017 CLINICAL DATA:  Right-sided lumbar pain for 2 months without known injury. EXAM: LUMBAR SPINE - COMPLETE 4+ VIEW COMPARISON:  CT scan of July 24, 2014. FINDINGS: No fracture or spondylolisthesis is noted. Moderate degenerative disc disease is noted at L1-2, L2-3, L3-4 and L4-5 with anterior osteophyte formation and vacuum phenomenon. Degenerative changes seen involving the right-sided posterior facet joints at L3-4 and L4-5. IMPRESSION: Multilevel degenerative disc disease as described above. No acute abnormality seen in the lumbar spine. Electronically Signed   By: Lupita Raider, M.D.   On: 02/28/2017 12:31   Dg Si Joints  Result Date: 03/01/2017 CLINICAL DATA:  Hip pain, no injury EXAM: BILATERAL SACROILIAC JOINTS - 3+ VIEW COMPARISON:  MRI pelvis 07/26/2014 FINDINGS: The sacroiliac joint spaces are maintained and there is no evidence of arthropathy. No other bone abnormalities are seen. IMPRESSION: Negative. Electronically Signed   By: Marlan Palau M.D.   On: 03/01/2017 18:57    Procedures Procedures (including critical care time)  Medications Ordered in ED Medications  oxyCODONE-acetaminophen (PERCOCET/ROXICET) 5-325 MG per tablet 1 tablet (1 tablet Oral Given 03/01/17 1813)     Initial Impression / Assessment and Plan / ED Course  I have reviewed the triage vital signs and the nursing notes.  Pertinent labs & imaging results that were available during my care of the patient were reviewed by me and considered in my medical decision making (see chart for details).     Patient in emergency department with worsening lower back pain. Pain initiates at [facility] joint and radiates into buttock.question radicular pain versus osteoarthritis and SI joint or even lumbar spine. Also potentially could be an muscular problem. He is  neurovascularly intact, I do not suspect cauda equina. Lumbar x-rays were performed yesterday and showed multilevel degenerative changes. I performed SI joint films which are unremarkable. Patient is already taking Neurontin, prednisone, NSAIDs, Flexeril for his symptoms.He is also prescribed Tylenol No. 3 which he states he has not picked up from the pharmacy because he has no money. I gave him a Percocet for his pain here, I advised him to try to get his Tylenol 3 filled, also discussed stretches, massage, heating pad, back exercises. It looks like his family doctor is also referring him for outpatient physical therapy. We discussed signs and  symptoms that should prompt his return back. Patient agreed. He is also asking for work note.  Vitals:   03/01/17 1604  BP: (!) 162/100  Pulse: 64  Resp: 19  Temp: 97.6 F (36.4 C)  TempSrc: Oral  SpO2: 99%  Weight: (!) 158.8 kg (350 lb)  Height: 5\' 7"  (1.702 m)     Final Clinical Impressions(s) / ED Diagnoses   Final diagnoses:  Hip pain  Acute left-sided low back pain with left-sided sciatica    New Prescriptions New Prescriptions   No medications on file     Jaynie CrumbleKirichenko, Eustacia Urbanek, Cordelia Poche-C 03/01/17 1951    Tegeler, Canary Brimhristopher J, MD 03/01/17 2308

## 2017-03-06 MED FILL — ALLOPURINOL 300 MG TABS: 300 | 30 days supply | Qty: 30 | Fill #1

## 2017-03-06 MED FILL — BUPROPION SR 150 MG TABLET: 150 | 30 days supply | Qty: 60 | Fill #2

## 2017-03-06 MED FILL — TAMSULOSIN HCL 0.4 MG CAP: 0.4 | 30 days supply | Qty: 30 | Fill #2

## 2017-03-06 MED FILL — BIDIL TABLET: 20-37.5 | 30 days supply | Qty: 180 | Fill #0

## 2017-03-06 MED FILL — GABAPENTIN 300 MG CAPSULE: 300 | 30 days supply | Qty: 90 | Fill #0

## 2017-03-06 MED FILL — ALL DAY ALLERGY 10 MG TAB: 10 | 30 days supply | Qty: 30 | Fill #1

## 2017-03-06 MED FILL — METOPROLOL TARTRATE 50 MG T: 50 | 30 days supply | Qty: 60 | Fill #2

## 2017-03-06 MED FILL — CYCLOBENZAPRINE 10 MG TAB: 10 | 20 days supply | Qty: 60 | Fill #2

## 2017-03-14 ENCOUNTER — Ambulatory Visit: Payer: Medicaid Other | Admitting: Physical Therapy

## 2017-03-21 ENCOUNTER — Encounter: Payer: Self-pay | Admitting: Physical Therapy

## 2017-03-21 ENCOUNTER — Ambulatory Visit: Payer: Medicaid Other | Attending: Internal Medicine | Admitting: Physical Therapy

## 2017-03-21 DIAGNOSIS — M545 Low back pain, unspecified: Secondary | ICD-10-CM

## 2017-03-21 NOTE — Therapy (Signed)
[facility] [address] [place], [place], [address] Phone: [phone]   Fax:  [phone]  Physical Therapy Evaluation  Patient Details  Name: Rickey Calderon MRN: [id number] Date of Birth: [birth year] Referring Provider: Marcine Matar  Encounter Date: 03/21/2017      PT End of Session - 03/21/17 0940    Visit Number 1   Number of Visits 2   Authorization Type Medicaid   PT Start Time 0914   PT Stop Time 1001   PT Time Calculation (min) 47 min   Activity Tolerance Patient tolerated treatment well   Behavior During Therapy [facility] for tasks assessed/performed      Past Medical History:  Diagnosis Date  . Asthma   . Bronchitis   . CHF (congestive heart failure) (HCC)    EF 30% by echo,  EF 50% Nov 2017  . Depression   . Femoral thrombosis (HCC)   . Gout   . Hypertension   . Morbid obesity (HCC)   . Prostatitis   . Seasonal allergies   . Sleep apnea    BiPAP    Past Surgical History:  Procedure Laterality Date  . ABDOMINAL AORTAGRAM N/A 07/28/2014   Procedure: ABDOMINAL Ronny Flurry;  Surgeon: Chuck Hint, MD;  Location: [facility] CATH LAB;  Service: Cardiovascular;  Laterality: N/A;  . LOWER EXTREMITY ANGIOGRAM Bilateral 07/28/2014   Procedure: LOWER EXTREMITY ANGIOGRAM;  Surgeon: Chuck Hint, MD;  Location: [facility] CATH LAB;  Service: Cardiovascular;  Laterality: Bilateral;  . THROMBECTOMY FEMORAL ARTERY Right 08/01/2014   Procedure: THROMBECTOMY RIGHT LEG SFA, Anterior tibial, and perineal arteries with intraoperative arteriograms;  Surgeon: Nada Libman, MD;  Location: [facility] OR;  Service: Vascular;  Laterality: Right;    There were no vitals filed for this visit.       Subjective Assessment - 03/21/17 0920    Subjective Patient reports that he has had some LBP for about a year, he is unsure of a specific cause.  X-rays show DDD and arthritic changes in the lumbar spine.  Reports some buttock pain    Limitations Standing;Walking;House hold activities   Patient Stated Goals have less pain   Currently in Pain? Yes   Pain Score 1    Pain Location Back   Pain Orientation Lower   Pain Descriptors / Indicators Aching   Pain Type Acute pain   Pain Onset More than a month ago   Pain Frequency Constant   Aggravating Factors  sitting, standing, getting up from lying down  pain up to 9/10   Pain Relieving Factors pain medication pain can be 1-2/10   Effect of Pain on Daily Activities limits a little bit of everything            [facility] PT Assessment - 03/21/17 0001      Assessment   Medical Diagnosis LBP   Referring Provider Marcine Matar   Onset Date/Surgical Date 02/18/17   Prior Therapy no     Precautions   Precautions None     Balance Screen   Has the patient fallen in the past 6 months Yes   How many times? 3   Has the patient had a decrease in activity level because of a fear of falling?  No   Is the patient reluctant to leave their home because of a fear of falling?  No     Home Environment   Additional Comments some stairs  Prior Function   Level of Independence Independent   Vocation Full time employment   Vocation Requirements custodian, pushing, pulling some lifting   Leisure no exercise     Posture/Postural Control   Posture Comments very obese, slouched posture     ROM / Strength   AROM / PROM / Strength AROM;Strength     AROM   Overall AROM Comments Lumbar ROM decreased 75%     Strength   Overall Strength Comments 4+/5     Flexibility   Soft Tissue Assessment /Muscle Length --  some HS and piriformis tightness     Palpation   Palpation comment tight in the lumbar parapsinals            Objective measurements completed on examination: See above findings.          [facility] Adult PT Treatment/Exercise - 03/21/17 0001      Modalities   Modalities Moist Heat;Electrical Stimulation     Moist Heat Therapy   Number Minutes Moist  Heat 15 Minutes   Moist Heat Location Lumbar Spine     Electrical Stimulation   Electrical Stimulation Location lumbar   Electrical Stimulation Action IFC   Electrical Stimulation Parameters sitting   Electrical Stimulation Goals Pain                PT Education - 03/21/17 [phone]    Education provided Yes   Education Details TENS information, Wms Flexion   Person(s) Educated Patient   Methods Explanation;Demonstration;Handout   Comprehension Verbalized understanding             PT Long Term Goals - 03/21/17 0943      PT LONG TERM GOAL #1   Title understand posture and body mechanics   Time 2   Period Weeks   Status New     PT LONG TERM GOAL #2   Title independent with HEP   Time 2   Period Weeks   Status New                Plan - 03/21/17 0940    Clinical Impression Statement Patient reports that he has had LBP for about a year, he reports recently he has jerked awake and fell out of bed 2-3 times.  X-rays show DDD and arthritis.  He has limited lumbar ROM.  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   Clinical Presentation Stable   Clinical Decision Making Low   Rehab Potential Good   PT Frequency 1x / week   PT Duration 2 weeks   PT Treatment/Interventions ADLs/Self Care Home Management;Electrical Stimulation;Moist Heat;Therapeutic activities;Therapeutic exercise;Patient/family education   PT Next Visit Plan Mediciad allows one visit, we will see him one more time to go over body mechanics and advanced exercises for Lumbar stability   Consulted and Agree with Plan of Care Patient      Patient will benefit from skilled therapeutic intervention in order to improve the following deficits and impairments:  Decreased activity tolerance, Decreased mobility, Decreased strength, Postural dysfunction, Improper body mechanics, Impaired flexibility, Pain, Increased muscle spasms, Decreased range of motion, Difficulty walking  Visit  Diagnosis: Acute bilateral low back pain without sciatica - Plan: PT plan of care cert/re-cert     Problem List Patient Active Problem List   Diagnosis Date Noted  . Hypertensive cardiomyopathy, without heart failure (HCC) 05/17/2016  . 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 Exertional dyspnea 04/22/2016  . Obesity  hypoventilation syndrome (HCC) 04/22/2016  . CKD (chronic kidney disease) stage 3, GFR 30-59 ml/min 02/02/2016  . Chronic combined systolic and diastolic heart failure, NYHA class 2 (HCC) 09/29/2015  . Chronic neck pain 09/21/2015  . Asthma   . Depression 07/10/2015  . Financial difficulties 04/30/2015  . Morbid obesity (HCC) 03/31/2015  . Chronic low back pain 03/31/2015  . OSA on CPAP 07/25/2014  . Essential hypertension   . Gout   . Seasonal allergies   . Chronic prostatitis     Jearld Lesch., PT 03/21/2017, 9:45 AM  [facility] [address] [place], [place], [address] Phone: [phone]   Fax:  [phone]  Name: Rickey Calderon MRN: [id number] Date of Birth: [birth year]

## 2017-03-30 ENCOUNTER — Encounter: Payer: Self-pay | Admitting: Internal Medicine

## 2017-03-30 ENCOUNTER — Ambulatory Visit: Payer: Medicaid Other | Attending: Internal Medicine | Admitting: Internal Medicine

## 2017-03-30 VITALS — BP 149/98 | HR 72 | Temp 98.2°F | Resp 16 | Wt 346.0 lb

## 2017-03-30 DIAGNOSIS — I130 Hypertensive heart and chronic kidney disease with heart failure and stage 1 through stage 4 chronic kidney disease, or unspecified chronic kidney disease: Secondary | ICD-10-CM | POA: Insufficient documentation

## 2017-03-30 DIAGNOSIS — N183 Chronic kidney disease, stage 3 (moderate): Secondary | ICD-10-CM | POA: Insufficient documentation

## 2017-03-30 DIAGNOSIS — G8929 Other chronic pain: Secondary | ICD-10-CM | POA: Insufficient documentation

## 2017-03-30 DIAGNOSIS — R21 Rash and other nonspecific skin eruption: Secondary | ICD-10-CM | POA: Insufficient documentation

## 2017-03-30 DIAGNOSIS — I10 Essential (primary) hypertension: Secondary | ICD-10-CM

## 2017-03-30 DIAGNOSIS — M109 Gout, unspecified: Secondary | ICD-10-CM | POA: Diagnosis not present

## 2017-03-30 DIAGNOSIS — B356 Tinea cruris: Secondary | ICD-10-CM | POA: Insufficient documentation

## 2017-03-30 DIAGNOSIS — F329 Major depressive disorder, single episode, unspecified: Secondary | ICD-10-CM | POA: Diagnosis not present

## 2017-03-30 DIAGNOSIS — J4520 Mild intermittent asthma, uncomplicated: Secondary | ICD-10-CM | POA: Diagnosis not present

## 2017-03-30 DIAGNOSIS — Z79899 Other long term (current) drug therapy: Secondary | ICD-10-CM | POA: Insufficient documentation

## 2017-03-30 DIAGNOSIS — G4733 Obstructive sleep apnea (adult) (pediatric): Secondary | ICD-10-CM | POA: Diagnosis not present

## 2017-03-30 DIAGNOSIS — K1379 Other lesions of oral mucosa: Secondary | ICD-10-CM

## 2017-03-30 DIAGNOSIS — N411 Chronic prostatitis: Secondary | ICD-10-CM | POA: Diagnosis not present

## 2017-03-30 DIAGNOSIS — K1370 Unspecified lesions of oral mucosa: Secondary | ICD-10-CM | POA: Insufficient documentation

## 2017-03-30 DIAGNOSIS — I5042 Chronic combined systolic (congestive) and diastolic (congestive) heart failure: Secondary | ICD-10-CM | POA: Diagnosis not present

## 2017-03-30 DIAGNOSIS — I43 Cardiomyopathy in diseases classified elsewhere: Secondary | ICD-10-CM | POA: Diagnosis not present

## 2017-03-30 MED ORDER — CLOTRIMAZOLE 1 % EX CREA: 1.0000 "application " | TOPICAL_CREAM | Freq: Two times a day (BID) | CUTANEOUS | 0 refills | Status: DC

## 2017-03-30 MED ORDER — LIDOCAINE VISCOUS 2 % MT SOLN: OROMUCOSAL | 0 refills | Status: DC

## 2017-03-30 MED FILL — LIDOCAINE 2% VISCOUS SOLN: 2 | 3 days supply | Qty: 200 | Fill #0

## 2017-03-30 NOTE — Progress Notes (Signed)
Patient ID: Rickey Calderon, male    DOB: July 27, 1965  MRN: [id number]  CC: Rash   Subjective: Rickey Calderon is a 51 y.o. male who presents for u/C visit His concerns today include:  Pt with hx of HTN, OSA on BiPAP, CKD 3, gout, asthma (mild intermittent), chronic LBP (on Tylenol #3, Flexeril and Gabapentin), obesity, depression, cardiomyopathy with EF inc from 25% to 45-50% 04/2016 (thought to be due to HTN. 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  On last visit with me patient was referred to physical therapy for his back. X-ray revealed multilevel DDD. He was seen in the ED 9/5 for back pain. He has started 1 physical therapy session. And plan is for 1 more. Sessions limited because of Medicaid insurance.  Today: 1. C/o rash posterior thigh -there x 1 yr. Spouse told him it is increasing in size.  -little itching.   2. c/o having sore on inside of RT cheek x 1-2 wks. Comes and goes. Also accidentally bite his tongue on RT side recently while chewing gum Neg HIV test last yr Patient Active Problem List   Diagnosis Date Noted  . Hypertensive cardiomyopathy, without heart failure (HCC) 05/17/2016  . 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 Exertional dyspnea 04/22/2016  . Obesity hypoventilation syndrome (HCC) 04/22/2016  . CKD (chronic kidney disease) stage 3, GFR 30-59 ml/min (HCC) 02/02/2016  . Chronic combined systolic and diastolic heart failure, NYHA class 2 (HCC) 09/29/2015  . Chronic neck pain 09/21/2015  . Asthma   . Depression 07/10/2015  . Financial difficulties 04/30/2015  . Morbid obesity (HCC) 03/31/2015  . Chronic low back pain 03/31/2015  . OSA on CPAP 07/25/2014  . Essential hypertension   . Gout   . Seasonal allergies   . Chronic prostatitis      Current Outpatient Prescriptions on File Prior to Visit    Medication Sig Dispense Refill  . acetaminophen-codeine (TYLENOL #3) 300-30 MG tablet TAKE 1 TABLET BY MOUTH EVERY 8 HOURS AS NEEDED FOR MODERATE PAIN (Patient not taking: Reported on 02/28/2017) 90 tablet 2  . albuterol (PROVENTIL HFA;VENTOLIN HFA) 108 (90 Base) MCG/ACT inhaler Inhale 2 puffs into the lungs every 6 (six) hours as needed for wheezing or shortness of breath. 1 Inhaler 5  . allopurinol (ZYLOPRIM) 300 MG tablet Take 1 tablet (300 mg total) by mouth daily. 30 tablet 5  . amLODipine (NORVASC) 5 MG tablet Take 1.5 tablets (7.5 mg total) by mouth daily. 45 tablet 5  . buPROPion (WELLBUTRIN SR) 150 MG 12 hr tablet Take 1 tablet (150 mg total) by mouth 2 (two) times daily. 60 tablet 5  . cetirizine (ZYRTEC) 10 MG tablet Take 1 tablet (10 mg total) by mouth daily. 30 tablet 5  . cyclobenzaprine (FLEXERIL) 10 MG tablet TAKE 1 TABLET BY MOUTH 3 TIMES DAILY AS NEEDED FOR MUSCLE SPASMS. 60 tablet 5  . Diclofenac Sodium 3 % GEL Place 1-2 g onto the skin 4 (four) times daily as needed (neck pain). 100 g 2  . fluticasone (FLONASE) 50 MCG/ACT nasal spray Place 1 spray into both nostrils daily. 16 g 6  . Fluticasone-Salmeterol (ADVAIR) 100-50 MCG/DOSE AEPB Inhale 1 puff into the lungs 2 (two) times daily. 60 each 5  . furosemide (LASIX) 40 MG tablet Take 1 tablet (40 mg total) by mouth daily. 30 tablet 5  . gabapentin (  NEURONTIN) 300 MG capsule Take 1 capsule (300 mg total) by mouth 3 (three) times daily. 90 capsule 5  . isosorbide-hydrALAZINE (BIDIL) 20-37.5 MG tablet Take 2 tablets by mouth 3 (three) times daily. 180 tablet 5  . metoprolol tartrate (LOPRESSOR) 50 MG tablet Take 1 tablet (50 mg total) by mouth 2 (two) times daily. 60 tablet 5  . Potassium Chloride ER 20 MEQ TBCR Take 1 tablet by mouth daily. 30 tablet 5  . predniSONE (DELTASONE) 50 MG tablet Take 1 tablet (50 mg total) by mouth daily with breakfast. For 5 days for back pain flare ups, repeat as needed no more every 6 weeks 15 tablet 0   . tamsulosin (FLOMAX) 0.4 MG CAPS capsule TAKE 1 CAPSULE BY MOUTH DAILY AFTER SUPPER. 30 capsule 5   No current facility-administered medications on file prior to visit.     No Known Allergies  Social History   Social History  . Marital status: Married    Spouse name: N/A  . Number of children: N/A  . Years of education: N/A   Occupational History  . Not on file.   Social History Main Topics  . Smoking status: Never Smoker  . Smokeless tobacco: Never Used  . Alcohol use No  . Drug use: No  . Sexual activity: Not on file   Other Topics Concern  . Not on file   Social History Narrative  . No narrative on file    Family History  Problem Relation Age of Onset  . Diabetes Father   . Hyperlipidemia Father     Past Surgical History:  Procedure Laterality Date  . ABDOMINAL AORTAGRAM N/A 07/28/2014   Procedure: ABDOMINAL Ronny Flurry;  Surgeon: Chuck Hint, MD;  Location: [facility] CATH LAB;  Service: Cardiovascular;  Laterality: N/A;  . LOWER EXTREMITY ANGIOGRAM Bilateral 07/28/2014   Procedure: LOWER EXTREMITY ANGIOGRAM;  Surgeon: Chuck Hint, MD;  Location: [facility] CATH LAB;  Service: Cardiovascular;  Laterality: Bilateral;  . THROMBECTOMY FEMORAL ARTERY Right 08/01/2014   Procedure: THROMBECTOMY RIGHT LEG SFA, Anterior tibial, and perineal arteries with intraoperative arteriograms;  Surgeon: Nada Libman, MD;  Location: [facility] OR;  Service: Vascular;  Laterality: Right;    ROS: Review of Systems Negative except as stated above  PHYSICAL EXAM: BP (!) 149/98   Pulse 72   Temp 98.2 F (36.8 C) (Oral)   Resp 16   Wt (!) 346 lb (156.9 kg)   SpO2 96%   BMI 54.19 kg/m   Physical Exam General appearance - alert, well appearing, obese male and in no distress Mental status - alert, oriented to person, place, and time, normal mood, behavior, speech, dress, motor activity, and thought processes Mouth - small bruising noted on RT anterior tongue. About 2 cm area  irritation noted inside RT cheek. No other oral lesions seen. Throat clear Neck: no cervical LN Skin - 3x3 cm macular, hyperpigmeted, circular area posterior LT thigh. Center has wax paper appearance. pic shown below     ASSESSMENT AND PLAN: 1. Skin rash -looks like possibly old injury vs tinea. Will give trail of antifungal cream - clotrimazole (LOTRIMIN AF JOCK ITCH) 1 % cream; Apply 1 application topically 2 (two) times daily. Apply to affected area BID  Dispense: 30 g; Refill: 0  2. Mouth sore ? Trauma to cheek. Will give Lidocaine to use PRN for soreness. Consider dental or oral surg referral if not healing - lidocaine (XYLOCAINE) 2 % solution; Swish and spit 20 ml 3-4 times  a day for mouth pain  Dispense: 200 mL; Refill: 0  3. Essential hypertension Pt did not take meds as yet for the a.m. Told to take when he returns home.  Patient was given the opportunity to ask questions.  Patient verbalized understanding of the plan and was able to repeat key elements of the plan.   No orders of the defined types were placed in this encounter.    Requested Prescriptions   Signed Prescriptions Disp Refills  . lidocaine (XYLOCAINE) 2 % solution 200 mL 0    Sig: Swish and spit 20 ml 3-4 times a day for mouth pain  . clotrimazole (LOTRIMIN AF JOCK ITCH) 1 % cream 30 g 0    Sig: Apply 1 application topically 2 (two) times daily. Apply to affected area BID    Return if symptoms worsen or fail to improve.  Jonah Blue, MD, FACP

## 2017-03-30 NOTE — Patient Instructions (Signed)
Use the Lidocaine solution as instructed. Do not swallow it.

## 2017-03-31 ENCOUNTER — Ambulatory Visit: Payer: Medicaid Other | Attending: Internal Medicine | Admitting: Physical Therapy

## 2017-03-31 DIAGNOSIS — M545 Low back pain, unspecified: Secondary | ICD-10-CM

## 2017-03-31 NOTE — Therapy (Signed)
[facility] [address] [place], [place], [address] Phone: [phone]   Fax:  [phone]  Physical Therapy Treatment  Patient Details  Name: Rickey Calderon MRN: [id number] Date of Birth: Mar 24, 1966 Referring Provider: Marcine Matar  Encounter Date: 03/31/2017      PT End of Session - 03/31/17 1155    Visit Number 2   Number of Visits 2   PT Start Time 0930   PT Stop Time 1015   PT Time Calculation (min) 45 min   Activity Tolerance Patient tolerated treatment well      Past Medical History:  Diagnosis Date   Asthma    Bronchitis    CHF (congestive heart failure) (HCC)    EF 30% by echo,  EF 50% Nov 2017   Depression    Femoral thrombosis (HCC)    Gout    Hypertension    Morbid obesity (HCC)    Prostatitis    Seasonal allergies    Sleep apnea    BiPAP    Past Surgical History:  Procedure Laterality Date   ABDOMINAL AORTAGRAM N/A 07/28/2014   Procedure: ABDOMINAL Ronny Flurry;  Surgeon: Chuck Hint, MD;  Location: [facility] CATH LAB;  Service: Cardiovascular;  Laterality: N/A;   LOWER EXTREMITY ANGIOGRAM Bilateral 07/28/2014   Procedure: LOWER EXTREMITY ANGIOGRAM;  Surgeon: Chuck Hint, MD;  Location: [facility] CATH LAB;  Service: Cardiovascular;  Laterality: Bilateral;   THROMBECTOMY FEMORAL ARTERY Right 08/01/2014   Procedure: THROMBECTOMY RIGHT LEG SFA, Anterior tibial, and perineal arteries with intraoperative arteriograms;  Surgeon: Nada Libman, MD;  Location: [facility] OR;  Service: Vascular;  Laterality: Right;    There were no vitals filed for this visit.      Subjective Assessment - 03/31/17 0934    Subjective Back is still feeling pretty painful and stiff.  HEP is going okay, though he does report some increased difficulty involving his LEft LE d/t increased pain in his buttock and hip   Currently in Pain? Yes   Pain Location Back   Pain Orientation Left;Lower   Pain  Descriptors / Indicators Aching                         [facility] Adult PT Treatment/Exercise - 03/31/17 0001      Lumbar Exercises: Stretches   Active Hamstring Stretch 3 reps;30 seconds  bilateral   Single Knee to Chest Stretch 5 reps;10 seconds  bilateral   Lower Trunk Rotation 10 seconds;5 reps  bilateral   Pelvic Tilt 5 reps;10 seconds     Lumbar Exercises: Standing   Functional Squats 10 reps;2 seconds  2 sets   Other Standing Lumbar Exercises body mechanics for liftin, pulling, pushing     Lumbar Exercises: Seated   Other Seated Lumbar Exercises L/S flexion in sitting. 5"x10     Lumbar Exercises: Supine   Other Supine Lumbar Exercises PPT with P ball DKTC. 2x10                     PT Long Term Goals - 03/21/17 0943      PT LONG TERM GOAL #1   Title understand posture and body mechanics   Time 2   Period Weeks   Status New     PT LONG TERM GOAL #2   Title independent with HEP   Time 2   Period Weeks   Status New  Plan - 03/31/17 1156    Clinical Impression Statement Excellent response to flexion based movement pattern.  Did well with LE stretching and lumbopelvic stabilization exercises. 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) 05/17/2016   Exertional dyspnea 04/22/2016   Obesity hypoventilation syndrome (HCC) 04/22/2016   CKD (chronic kidney disease) stage 3, GFR 30-59 ml/min (HCC) 02/02/2016   Chronic combined systolic and diastolic heart failure, NYHA class 2 (HCC) 09/29/2015   Chronic  neck pain 09/21/2015   Asthma    Depression 07/10/2015   Financial difficulties 04/30/2015   Morbid obesity (HCC) 03/31/2015   Chronic low back pain 03/31/2015   OSA on CPAP 07/25/2014   Essential hypertension    Gout    Seasonal allergies    Chronic prostatitis     Tomie China, PTA 03/31/2017, 11:59 AM  [facility] [address] [place], [place], [address] Phone: [phone]   Fax:  [phone]  Name: Rickey Calderon MRN: [id number] Date of Birth: 1965-12-09

## 2017-04-05 MED FILL — ALL DAY ALLERGY 10 MG TAB: 10 | 30 days supply | Qty: 30 | Fill #2

## 2017-04-05 MED FILL — FLUTICASONE PROP 50 MCG SPR: 50 | 30 days supply | Qty: 16 | Fill #1

## 2017-04-05 MED FILL — CYCLOBENZAPRINE 10 MG TAB: 10 | 20 days supply | Qty: 60 | Fill #3

## 2017-04-05 MED FILL — METOPROLOL TARTRATE 50 MG T: 50 | 30 days supply | Qty: 60 | Fill #3

## 2017-04-05 MED FILL — TAMSULOSIN HCL 0.4 MG CAP: 0.4 | 30 days supply | Qty: 30 | Fill #3

## 2017-04-05 MED FILL — ALLOPURINOL 300 MG TABS: 300 | 30 days supply | Qty: 30 | Fill #2

## 2017-04-05 MED FILL — GABAPENTIN 300 MG CAPSULE: 300 | 30 days supply | Qty: 90 | Fill #1

## 2017-04-05 MED FILL — AMLODIPINE BESYLATE 5 MG TA: 5 | 30 days supply | Qty: 45 | Fill #1

## 2017-04-05 MED FILL — BUPROPION SR 150 MG TABLET: 150 | 30 days supply | Qty: 60 | Fill #3

## 2017-04-05 MED FILL — BIDIL TABLET: 20-37.5 | 30 days supply | Qty: 180 | Fill #1

## 2017-04-18 ENCOUNTER — Encounter: Payer: Self-pay | Admitting: Internal Medicine

## 2017-04-18 ENCOUNTER — Ambulatory Visit: Payer: Medicaid Other | Attending: Internal Medicine | Admitting: Internal Medicine

## 2017-04-18 VITALS — BP 133/88 | HR 64 | Temp 98.2°F | Resp 16 | Wt 343.2 lb

## 2017-04-18 DIAGNOSIS — I10 Essential (primary) hypertension: Secondary | ICD-10-CM | POA: Diagnosis not present

## 2017-04-18 DIAGNOSIS — I5042 Chronic combined systolic (congestive) and diastolic (congestive) heart failure: Secondary | ICD-10-CM | POA: Insufficient documentation

## 2017-04-18 DIAGNOSIS — I43 Cardiomyopathy in diseases classified elsewhere: Secondary | ICD-10-CM | POA: Insufficient documentation

## 2017-04-18 DIAGNOSIS — Z6841 Body Mass Index (BMI) 40.0 and over, adult: Secondary | ICD-10-CM | POA: Insufficient documentation

## 2017-04-18 DIAGNOSIS — M545 Low back pain: Secondary | ICD-10-CM | POA: Diagnosis not present

## 2017-04-18 DIAGNOSIS — M109 Gout, unspecified: Secondary | ICD-10-CM | POA: Insufficient documentation

## 2017-04-18 DIAGNOSIS — K1379 Other lesions of oral mucosa: Secondary | ICD-10-CM

## 2017-04-18 DIAGNOSIS — J449 Chronic obstructive pulmonary disease, unspecified: Secondary | ICD-10-CM | POA: Diagnosis not present

## 2017-04-18 DIAGNOSIS — G8929 Other chronic pain: Secondary | ICD-10-CM | POA: Diagnosis not present

## 2017-04-18 DIAGNOSIS — N183 Chronic kidney disease, stage 3 unspecified: Secondary | ICD-10-CM

## 2017-04-18 DIAGNOSIS — I130 Hypertensive heart and chronic kidney disease with heart failure and stage 1 through stage 4 chronic kidney disease, or unspecified chronic kidney disease: Secondary | ICD-10-CM | POA: Insufficient documentation

## 2017-04-18 DIAGNOSIS — G479 Sleep disorder, unspecified: Secondary | ICD-10-CM | POA: Diagnosis not present

## 2017-04-18 DIAGNOSIS — R21 Rash and other nonspecific skin eruption: Secondary | ICD-10-CM | POA: Insufficient documentation

## 2017-04-18 DIAGNOSIS — J4520 Mild intermittent asthma, uncomplicated: Secondary | ICD-10-CM | POA: Insufficient documentation

## 2017-04-18 DIAGNOSIS — N411 Chronic prostatitis: Secondary | ICD-10-CM | POA: Diagnosis not present

## 2017-04-18 DIAGNOSIS — K1370 Unspecified lesions of oral mucosa: Secondary | ICD-10-CM | POA: Insufficient documentation

## 2017-04-18 DIAGNOSIS — F329 Major depressive disorder, single episode, unspecified: Secondary | ICD-10-CM | POA: Insufficient documentation

## 2017-04-18 DIAGNOSIS — Z79899 Other long term (current) drug therapy: Secondary | ICD-10-CM | POA: Insufficient documentation

## 2017-04-18 DIAGNOSIS — G4733 Obstructive sleep apnea (adult) (pediatric): Secondary | ICD-10-CM | POA: Diagnosis not present

## 2017-04-18 DIAGNOSIS — R0982 Postnasal drip: Secondary | ICD-10-CM | POA: Insufficient documentation

## 2017-04-18 NOTE — Progress Notes (Signed)
Patient ID: Rickey Calderon, male    DOB: 1966-06-22  MRN: [id number]  CC: Follow-up   Subjective: Rickey Calderon is a 51 y.o. male who presents for chronic ds management. His concerns today include:  Pt with hx of HTN, OSA on BiPAP, CKD 3, gout, asthma (mild intermittent) and mild COPD (PFTs 04/2016), chronic LBP (on Tylenol #3, Flexeril and Gabapentin), obesity, depression,cardiomyopathy with EF inc from 25% to 45-50% 04/2016 (thought to be due to HTN. 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  1. Mouth sores: resolved since last visit. Visc. Lido was helpful.  2. Cough: Thought to be due to postnasal drip. lessen with Flonase and Zyrtec.  3. LBP: therapy was helpful. Taught self exercises. -using Flexeril, Gabapentin and Aleve. No funds to get Tylenol # 3s. Takes 3 Aleves once a day.   4. HTN: compliant with Norvasc, Bidil and Metoprolol.  -not taking Lasix and K+ consistently because of work schedule, keeps him urinating. Takes only on wkends -no inc LE edema or  SOB  5. "I fall out of bed when sleeping." Wife also reports that he talks a lot in his sleep and does and a lot of twitching/shaking and apparent acting out of dreams. When he falls,out of bed, he wakes up. Occurs about 2-3 x a wk. Sleeps with spouse in queen size bed. Currently staying in a motel until housing situation is sorted out -denies any incontinence of bowel or bladder. Some biting of cheek but during the day. On last visit he was noted to have bruising on the tongue and inside of the cheeks -on Wellbutrin x 6 mths for depression  6. Rash LT posterior thigh persist post antifungal Patient Active Problem List   Diagnosis Date Noted  . Hypertensive cardiomyopathy, without heart failure (HCC) 05/17/2016  . 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 Exertional dyspnea  04/22/2016  . Obesity hypoventilation syndrome (HCC) 04/22/2016  . CKD (chronic kidney disease) stage 3, GFR 30-59 ml/min (HCC) 02/02/2016  . Chronic combined systolic and diastolic heart failure, NYHA class 2 (HCC) 09/29/2015  . Chronic neck pain 09/21/2015  . Asthma   . Depression 07/10/2015  . Financial difficulties 04/30/2015  . Morbid obesity (HCC) 03/31/2015  . Chronic low back pain 03/31/2015  . OSA on CPAP 07/25/2014  . Essential hypertension   . Gout   . Seasonal allergies   . Chronic prostatitis      Current Outpatient Prescriptions on File Prior to Visit  Medication Sig Dispense Refill  . acetaminophen-codeine (TYLENOL #3) 300-30 MG tablet TAKE 1 TABLET BY MOUTH EVERY 8 HOURS AS NEEDED FOR MODERATE PAIN (Patient not taking: Reported on 02/28/2017) 90 tablet 2  . albuterol (PROVENTIL HFA;VENTOLIN HFA) 108 (90 Base) MCG/ACT inhaler Inhale 2 puffs into the lungs every 6 (six) hours as needed for wheezing or shortness of breath. 1 Inhaler 5  . allopurinol (ZYLOPRIM) 300 MG tablet Take 1 tablet (300 mg total) by mouth daily. 30 tablet 5  . amLODipine (NORVASC) 5 MG tablet Take 1.5 tablets (7.5 mg total) by mouth daily. 45 tablet 5  . buPROPion (WELLBUTRIN SR) 150 MG 12 hr tablet Take 1 tablet (150 mg total) by mouth 2 (two) times daily. 60 tablet 5  . cetirizine (ZYRTEC) 10 MG tablet Take 1 tablet (10 mg total) by mouth daily. 30 tablet 5  . clotrimazole (LOTRIMIN AF  JOCK ITCH) 1 % cream Apply 1 application topically 2 (two) times daily. Apply to affected area BID 30 g 0  . cyclobenzaprine (FLEXERIL) 10 MG tablet TAKE 1 TABLET BY MOUTH 3 TIMES DAILY AS NEEDED FOR MUSCLE SPASMS. 60 tablet 5  . Diclofenac Sodium 3 % GEL Place 1-2 g onto the skin 4 (four) times daily as needed (neck pain). 100 g 2  . fluticasone (FLONASE) 50 MCG/ACT nasal spray Place 1 spray into both nostrils daily. 16 g 6  . Fluticasone-Salmeterol (ADVAIR) 100-50 MCG/DOSE AEPB Inhale 1 puff into the lungs 2 (two)  times daily. 60 each 5  . furosemide (LASIX) 40 MG tablet Take 1 tablet (40 mg total) by mouth daily. 30 tablet 5  . gabapentin (NEURONTIN) 300 MG capsule Take 1 capsule (300 mg total) by mouth 3 (three) times daily. 90 capsule 5  . isosorbide-hydrALAZINE (BIDIL) 20-37.5 MG tablet Take 2 tablets by mouth 3 (three) times daily. 180 tablet 5  . lidocaine (XYLOCAINE) 2 % solution Swish and spit 20 ml 3-4 times a day for mouth pain 200 mL 0  . metoprolol tartrate (LOPRESSOR) 50 MG tablet Take 1 tablet (50 mg total) by mouth 2 (two) times daily. 60 tablet 5  . Potassium Chloride ER 20 MEQ TBCR Take 1 tablet by mouth daily. 30 tablet 5  . predniSONE (DELTASONE) 50 MG tablet Take 1 tablet (50 mg total) by mouth daily with breakfast. For 5 days for back pain flare ups, repeat as needed no more every 6 weeks 15 tablet 0  . tamsulosin (FLOMAX) 0.4 MG CAPS capsule TAKE 1 CAPSULE BY MOUTH DAILY AFTER SUPPER. 30 capsule 5   No current facility-administered medications on file prior to visit.     No Known Allergies  Social History   Social History  . Marital status: Married    Spouse name: N/A  . Number of children: N/A  . Years of education: N/A   Occupational History  . Not on file.   Social History Main Topics  . Smoking status: Never Smoker  . Smokeless tobacco: Never Used  . Alcohol use No  . Drug use: No  . Sexual activity: Not on file   Other Topics Concern  . Not on file   Social History Narrative  . No narrative on file    Family History  Problem Relation Age of Onset  . Diabetes Father   . Hyperlipidemia Father     Past Surgical History:  Procedure Laterality Date  . ABDOMINAL AORTAGRAM N/A 07/28/2014   Procedure: ABDOMINAL Ronny Flurry;  Surgeon: Chuck Hint, MD;  Location: [facility] CATH LAB;  Service: Cardiovascular;  Laterality: N/A;  . LOWER EXTREMITY ANGIOGRAM Bilateral 07/28/2014   Procedure: LOWER EXTREMITY ANGIOGRAM;  Surgeon: Chuck Hint, MD;  Location:  [facility] CATH LAB;  Service: Cardiovascular;  Laterality: Bilateral;  . THROMBECTOMY FEMORAL ARTERY Right 08/01/2014   Procedure: THROMBECTOMY RIGHT LEG SFA, Anterior tibial, and perineal arteries with intraoperative arteriograms;  Surgeon: Nada Libman, MD;  Location: [facility] OR;  Service: Vascular;  Laterality: Right;    ROS: Review of Systems Negative except as above PHYSICAL EXAM: BP 133/88   Pulse 64   Temp 98.2 F (36.8 C) (Oral)   Resp 16   Wt (!) 343 lb 3.2 oz (155.7 kg)   SpO2 99%   BMI 53.75 kg/m   Physical Exam General appearance - alert, well appearing, obese male and in no distress Mental status - alert, oriented to person, place,  and time, normal mood, behavior, speech, dress, motor activity, and thought processes Mouth -  About 2 cm area irritation noted inside RT cheek persists. No other oral lesions seen. Throat clear Neck: no cervical LN Chest-clear to auscultation bilaterally CVS - regular rate and rhythm. No gallops or murmurs appreciated Extremities - trace to 1+ lower extremity edema Skin - 3x3 cm macular, hyperpigmeted, circular area posterior LT thigh. Center has wax paper appearance. Unchanged from last visit   Depression screen [facility] 2/9 04/18/2017 03/30/2017 01/05/2017 03/24/2016 02/12/2016  Decreased Interest 1 0 0 1 0  Down, Depressed, Hopeless 1 1 1 1 1   PHQ - 2 Score 2 1 1 2 1   Altered sleeping 1 - 1 1 1   Tired, decreased energy 1 - 1 0 0  Change in appetite 1 - 1 1 0  Feeling bad or failure about yourself  1 - 1 1 0  Trouble concentrating 0 - 0 0 1  Moving slowly or fidgety/restless 0 - 0 0 0  Suicidal thoughts 0 - 0 0 0  PHQ-9 Score 6 - 5 5 3   Some recent data might be hidden   Lab Results  Component Value Date   WBC 6.6 12/19/2016   HGB 13.5 12/19/2016   HCT 43.5 12/19/2016   MCV 87 12/19/2016   PLT 249 12/19/2016      Chemistry      Component Value Date/Time   NA 140 12/19/2016 0947   K 3.8 12/19/2016 0947   CL 99 12/19/2016 0947   CO2 26  12/19/2016 0947   BUN 23 12/19/2016 0947   CREATININE 1.46 (H) 12/19/2016 0947   CREATININE 1.24 02/12/2016 1632      Component Value Date/Time   CALCIUM 8.5 (L) 12/19/2016 0947   ALKPHOS 75 12/19/2016 0947   AST 16 12/19/2016 0947   ALT 18 12/19/2016 0947   BILITOT 0.4 12/19/2016 0947     Lab Results  Component Value Date   CHOL 169 03/31/2015   HDL 54 03/31/2015   LDLCALC 97 03/31/2015   TRIG 88 03/31/2015   CHOLHDL 3.1 03/31/2015     ASSESSMENT AND PLAN: 1. Mouth sore One on RT cheek persist. I recommend making appt with dentist or oral surgeon to eval further  2. Postnasal drip -cough dec with Zyrtec and Flonase  3. Chronic low back pain, unspecified back pain laterality, with sciatica presence unspecified -Continue gabapentin and Flexeril. Advised against use of NSAIDs due to kidney function. If he is unable to afford the Tylenol with codeine he can use regular Tylenol over-the-counter  4. Essential hypertension At goal. Continue current medications and DASH diet -Advised to use to furosemide more often given the lower extremity edema  5. Skin rash -Still thinks this represents old injury but will refer to done for evaluation - Ambulatory referral to Dermatology  6. Disturbance in sleep behavior -I reviewed sleep study report done last year. Clinically significant. Clinically movements were not noted during the study. Given his recent bruising noted on the palm and the cheeks and report of shaking and twitching in his sleep by spouse, want to make sure that he is not having myoclonic seizures. Will refer to neurology for evaluation -Also advise requesting a foam with king-size bed and perhaps using bed rails - Ambulatory referral to Neurology  7. CKD (chronic kidney disease) stage 3, GFR 30-59 ml/min (HCC) Patient told to stop taking daily Aleve. Okay to use regular Tylenol if unable to afford Tylenol # 3's  Patient was given the opportunity to ask questions.   Patient verbalized understanding of the plan and was able to repeat key elements of the plan.   Orders Placed This Encounter  Procedures  . Ambulatory referral to Neurology  . Ambulatory referral to Dermatology     Requested Prescriptions    No prescriptions requested or ordered in this encounter    Return in about 3 months (around 07/19/2017).  Jonah Blue, MD, FACP

## 2017-04-18 NOTE — Patient Instructions (Addendum)
He should request a king-size bed or use bed rails. I have referred due to the neurologists and to a dermatologist. Please make an appointment with a dentist or oral surgeon to take a look at the  irritated spot on your right inner cheek. You should stop taking Aleve as it will make your kidney function worse. He can use extra strength Tylenols as needed until you were able to get the Tylenol 3's.

## 2017-05-08 MED FILL — METOPROLOL TARTRATE 50 MG T: 50 | 30 days supply | Qty: 60 | Fill #4

## 2017-05-08 MED FILL — TAMSULOSIN HCL 0.4 MG CAP: 0.4 | 30 days supply | Qty: 30 | Fill #4

## 2017-05-08 MED FILL — BUPROPION SR 150 MG TABLET: 150 | 30 days supply | Qty: 60 | Fill #4

## 2017-05-08 MED FILL — AMLODIPINE BESYLATE 5 MG TA: 5 | 30 days supply | Qty: 45 | Fill #2

## 2017-05-08 MED FILL — CYCLOBENZAPRINE 10 MG TAB: 10 | 20 days supply | Qty: 60 | Fill #4

## 2017-05-08 MED FILL — DICLOFENAC SODIUM 3% GEL: 3 | 12 days supply | Qty: 100 | Fill #1

## 2017-05-08 MED FILL — GABAPENTIN 300 MG CAPSULE: 300 | 30 days supply | Qty: 90 | Fill #2

## 2017-05-08 MED FILL — BIDIL TABLET: 20-37.5 | 30 days supply | Qty: 180 | Fill #2

## 2017-05-08 MED FILL — ALL DAY ALLERGY 10 MG TAB: 10 | 30 days supply | Qty: 30 | Fill #3

## 2017-05-08 MED FILL — ALLOPURINOL 300 MG TABS: 300 | 30 days supply | Qty: 30 | Fill #3

## 2017-05-12 ENCOUNTER — Encounter (HOSPITAL_COMMUNITY): Payer: Self-pay | Admitting: Family Medicine

## 2017-05-12 ENCOUNTER — Ambulatory Visit (HOSPITAL_COMMUNITY)
Admission: EM | Admit: 2017-05-12 | Discharge: 2017-05-12 | Disposition: A | Discharge status: Home / Self Care | Payer: Medicaid Other | Attending: Internal Medicine | Admitting: Internal Medicine

## 2017-05-12 DIAGNOSIS — L03011 Cellulitis of right finger: Secondary | ICD-10-CM

## 2017-05-12 MED ORDER — CEPHALEXIN 500 MG PO CAPS: 500.0000 mg | ORAL_CAPSULE | Freq: Four times a day (QID) | ORAL | 0 refills | Status: DC

## 2017-05-12 NOTE — Discharge Instructions (Signed)
Keep area clean and dry. Daily dressing. Take keflex as directed. Follow up with PCP in 1 week for reevaluation if symptoms not improving. Monitor for spreading redness, increased warmth, fever, follow up for reevaluation.

## 2017-05-12 NOTE — ED Provider Notes (Signed)
[facility]    CSN: [id number] Arrival date & time: 05/12/17  1711     History   Chief Complaint Chief Complaint  Patient presents with  . Hand Pain    HPI Micheline MazeLennard Manke is a 51 y.o. male.   51 year old male comes in for 1 week history of swelling, discoloration of right index finger. States it is increasing in size. Denies fever, chills, night sweats. Has not taken anything for the symptoms. Denies injury/trauma. Denies history of DM.       Past Medical History:  Diagnosis Date  . Asthma   . Bronchitis   . CHF (congestive heart failure) (HCC)    EF 30% by echo,  EF 50% Nov 2017  . Depression   . Femoral thrombosis (HCC)   . Gout   . Hypertension   . Morbid obesity (HCC)   . Prostatitis   . Seasonal allergies   . Sleep apnea    BiPAP    Patient Active Problem List   Diagnosis Date Noted  . Hypertensive cardiomyopathy, without heart failure (HCC) 05/17/2016  . 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 Exertional dyspnea 04/22/2016  . Obesity hypoventilation syndrome (HCC) 04/22/2016  . CKD (chronic kidney disease) stage 3, GFR 30-59 ml/min (HCC) 02/02/2016  . Chronic combined systolic and diastolic heart failure, NYHA class 2 (HCC) 09/29/2015  . Chronic neck pain 09/21/2015  . Asthma   . Depression 07/10/2015  . Financial difficulties 04/30/2015  . Morbid obesity (HCC) 03/31/2015  . Chronic low back pain 03/31/2015  . OSA on CPAP 07/25/2014  . Essential hypertension   . Gout   . Seasonal allergies   . Chronic prostatitis     Past Surgical History:  Procedure Laterality Date  . ABDOMINAL AORTAGRAM N/A 07/28/2014   Performed by Chuck Hintickson, Christopher S[facility]  . LOWER EXTREMITY ANGIOGRAM Bilateral 07/28/2014   [facility]ickson[facility]  . THROMBECTOMY RIGHT LEG SFA, Anterior tibial, and perineal arteries with intraoperative arteriograms Right 08/01/2014   Performed by Nada LibmanBrabham,  Vance W, MD [facility]       Home Medications    Prior to Admission medications   Medication Sig Start Date End Date Taking? Authorizing Provider  acetaminophen-codeine (TYLENOL #3) 300-30 MG tablet TAKE 1 TABLET BY MOUTH EVERY 8 HOURS AS NEEDED FOR MODERATE PAIN Patient not taking: Reported on 02/28/2017 12/12/16   Dessa PhiFunches, Josalyn, MD  albuterol (PROVENTIL HFA;VENTOLIN HFA) 108 (90 Base) MCG/ACT inhaler Inhale 2 puffs into the lungs every 6 (six) hours as needed for wheezing or shortness of breath. 01/05/17   Funches, Gerilyn NestleJosalyn, MD  allopurinol (ZYLOPRIM) 300 MG tablet Take 1 tablet (300 mg total) by mouth daily. 01/05/17   Funches, Gerilyn NestleJosalyn, MD  amLODipine (NORVASC) 5 MG tablet Take 1.5 tablets (7.5 mg total) by mouth daily. 02/28/17   Marcine MatarJohnson, Deborah B, MD  buPROPion (WELLBUTRIN SR) 150 MG 12 hr tablet Take 1 tablet (150 mg total) by mouth 2 (two) times daily. 12/12/16   Funches, Gerilyn NestleJosalyn, MD  cephALEXin (KEFLEX) 500 MG capsule Take 1 capsule (500 mg total) 4 (four) times daily by mouth. 05/12/17   Yu, Amy V, PA-C  cetirizine (ZYRTEC) 10 MG tablet Take 1 tablet (10 mg total) by mouth daily. 01/05/17   Funches, Gerilyn NestleJosalyn, MD  clotrimazole (LOTRIMIN AF JOCK ITCH) 1 % cream Apply 1 application topically 2 (two) times daily. Apply to affected area  BID 03/30/17   Marcine MatarJohnson, Deborah B, MD  cyclobenzaprine (FLEXERIL) 10 MG tablet TAKE 1 TABLET BY MOUTH 3 TIMES DAILY AS NEEDED FOR MUSCLE SPASMS. 12/12/16   Funches, Gerilyn NestleJosalyn, MD  Diclofenac Sodium 3 % GEL Place 1-2 g onto the skin 4 (four) times daily as needed (neck pain). 12/12/16   Funches, Gerilyn NestleJosalyn, MD  fluticasone (FLONASE) 50 MCG/ACT nasal spray Place 1 spray into both nostrils daily. 02/28/17   Marcine MatarJohnson, Deborah B, MD  Fluticasone-Salmeterol (ADVAIR) 100-50 MCG/DOSE AEPB Inhale 1 puff into the lungs 2 (two) times daily. 12/12/16   Funches, Gerilyn NestleJosalyn, MD  furosemide (LASIX) 40 MG tablet Take 1 tablet (40 mg total) by mouth daily. 12/12/16   Funches, Gerilyn NestleJosalyn, MD    gabapentin (NEURONTIN) 300 MG capsule Take 1 capsule (300 mg total) by mouth 3 (three) times daily. 12/12/16   Funches, Gerilyn NestleJosalyn, MD  isosorbide-hydrALAZINE (BIDIL) 20-37.5 MG tablet Take 2 tablets by mouth 3 (three) times daily. 12/12/16   Funches, Gerilyn NestleJosalyn, MD  lidocaine (XYLOCAINE) 2 % solution Swish and spit 20 ml 3-4 times a day for mouth pain 03/30/17   Marcine MatarJohnson, Deborah B, MD  metoprolol tartrate (LOPRESSOR) 50 MG tablet Take 1 tablet (50 mg total) by mouth 2 (two) times daily. 12/12/16   Funches, Gerilyn NestleJosalyn, MD  Potassium Chloride ER 20 MEQ TBCR Take 1 tablet by mouth daily. 12/12/16   Funches, Gerilyn NestleJosalyn, MD  predniSONE (DELTASONE) 50 MG tablet Take 1 tablet (50 mg total) by mouth daily with breakfast. For 5 days for back pain flare ups, repeat as needed no more every 6 weeks 01/05/17   Dessa PhiFunches, Josalyn, MD  tamsulosin (FLOMAX) 0.4 MG CAPS capsule TAKE 1 CAPSULE BY MOUTH DAILY AFTER SUPPER. 12/12/16   Dessa PhiFunches, Josalyn, MD    Family History Family History  Problem Relation Age of Onset  . Diabetes Father   . Hyperlipidemia Father     Social History Social History   Tobacco Use  . Smoking status: Never Smoker  . Smokeless tobacco: Never Used  Substance Use Topics  . Alcohol use: No  . Drug use: No     Allergies   Patient has no known allergies.   Review of Systems Review of Systems  Reason unable to perform ROS: See HPI as above.     Physical Exam Triage Vital Signs ED Triage Vitals  Enc Vitals Group     BP 05/12/17 1741 140/89     Pulse Rate 05/12/17 1741 65     Resp 05/12/17 1741 18     Temp 05/12/17 1741 98.4 F (36.9 C)     Temp src --      SpO2 05/12/17 1741 99 %     Weight --      Height --      Head Circumference --      Peak Flow --      Pain Score 05/12/17 1738 9     Pain Loc --      Pain Edu? --      Excl. in GC? --    No data found.  Updated Vital Signs BP 140/89   Pulse 65   Temp 98.4 F (36.9 C)   Resp 18   SpO2 99%   Physical Exam   Constitutional: He is oriented to person, place, and time. He appears well-developed and well-nourished. No distress.  HENT:  Head: Normocephalic and atraumatic.  Eyes: Conjunctivae are normal. Pupils are equal, round, and reactive to light.  Musculoskeletal:  Paronychia of the  right index finger. Surrounding erythema, mild increased warmth. Tenderness on palpation. Full ROM of finger. Sensation intact.   Neurological: He is alert and oriented to person, place, and time.     UC Treatments / Results  Labs (all labs ordered are listed, but only abnormal results are displayed) Labs Reviewed - No data to display  EKG  EKG Interpretation None       Radiology No results found.  Procedures Incision and Drainage Date/Time: 05/12/2017 7:11 PM Performed by: Belinda Fisher, PA-C Authorized by: Eustace Moore, MD   Consent:    Consent obtained:  Verbal   Consent given by:  Patient   Risks discussed:  Bleeding, incomplete drainage and infection   Alternatives discussed:  Alternative treatment Location:    Type:  Abscess   Size:  1cm   Location:  Upper extremity   Upper extremity location:  Finger   Finger location:  R index finger Pre-procedure details:    Skin preparation:  Betadine Anesthesia (see MAR for exact dosages):    Anesthesia method:  Topical application   Topical anesthesia: freezing spray. Procedure type:    Complexity:  Simple Procedure details:    Needle aspiration: no     Incision types:  Stab incision   Incision depth:  Dermal   Scalpel blade:  11   Wound management:  Probed and deloculated   Drainage:  Purulent and bloody   Drainage amount:  Moderate   Wound treatment:  Wound left open   Packing materials:  None Post-procedure details:    Patient tolerance of procedure:  Tolerated well, no immediate complications    (including critical care time)  Medications Ordered in UC Medications - No data to display   Initial Impression / Assessment and  Plan / UC Course  I have reviewed the triage vital signs and the nursing notes.  Pertinent labs & imaging results that were available during my care of the patient were reviewed by me and considered in my medical decision making (see chart for details).    Patient tolerated procedure well. Keflex for surrounding cellulitis. Wound care instructions given. Return precautions given.   Final Clinical Impressions(s) / UC Diagnoses   Final diagnoses:  Paronychia of right index finger    ED Discharge Orders        Ordered    cephALEXin (KEFLEX) 500 MG capsule  4 times daily     05/12/17 1819        Lurline Idol 05/12/17 1913

## 2017-05-12 NOTE — ED Triage Notes (Signed)
Pt here for swelling to right index finger since yesterday.

## 2017-05-22 ENCOUNTER — Telehealth: Payer: Self-pay | Admitting: Adult Health

## 2017-05-22 NOTE — Telephone Encounter (Signed)
Received fax from [facility] regarding pt's CPAP. Pt is a former AD pt, pt is needing an ROV with TP. LMTCB for pt.

## 2017-05-29 NOTE — Telephone Encounter (Signed)
Pt has ROV with TP on 06/07/2017 for follow up. Nothing further needed at this time.

## 2017-06-02 MED FILL — AMLODIPINE BESYLATE 5 MG TA: 5 | 30 days supply | Qty: 45 | Fill #3

## 2017-06-07 ENCOUNTER — Ambulatory Visit: Payer: Medicaid Other | Admitting: Adult Health

## 2017-06-07 ENCOUNTER — Ambulatory Visit (INDEPENDENT_AMBULATORY_CARE_PROVIDER_SITE_OTHER): Payer: Medicaid Other | Admitting: Adult Health

## 2017-06-07 ENCOUNTER — Encounter: Payer: Self-pay | Admitting: Adult Health

## 2017-06-07 VITALS — BP 142/98 | HR 63 | Ht 67.0 in | Wt 334.0 lb

## 2017-06-07 DIAGNOSIS — G4733 Obstructive sleep apnea (adult) (pediatric): Secondary | ICD-10-CM

## 2017-06-07 NOTE — Patient Instructions (Signed)
Continue on BIPAP At bedtime  .  Wear each night for at least 4hr .  Do not drive if sleepy .  Good job with weight loss, keep up good work.  Follow up in 6 months with Dr. Vassie LollAlva  Or Chaniqua Brisby NP

## 2017-06-07 NOTE — Assessment & Plan Note (Signed)
Wt loss  

## 2017-06-07 NOTE — Progress Notes (Signed)
 @Patient  ID: Rickey Calderon, male    DOB: October 20, 1965, 51 y.o.   MRN: [id number]  Chief Complaint  Patient presents with  . Follow-up    OSA     Referring provider: Marcine MatarJohnson, Deborah B, MD  HPI: 51 yo male followed for Severe OSA and OHS  Has CHF   TEST  BIPAP titration 05/2016 >AHI 63/hr  autoBiPAP therapy with IPAP 15-20 cm water, EPAP 10-15 cm water with a Large size Resmed Full Face Mask AirFit F20 mask and heated humidification  06/07/2017 Follow up : OSA  Patient returns for a one year follow-up.  Patient has known severe sleep apnea.  He is on BiPAP at bedtime.   he says he is doing okay on BiPAP.  He feels that his mask is too small.  He previously had a large mask and was sent a medium mask.  He says he needs new supplies. 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  We discussed compliance and usage.  Download shows good compliance at 83%.  Average usage at 4.5 hours.  Patient is on BiPAP auto BiPAP 20, EPAP 5, pressure support 4 cm H2O.  AHI 3.7.  Possible leaks. Has been working on weight loss. Is down 30lbs. Congratulated on success.    No Known Allergies  Immunization History  Administered Date(s) Administered  . Influenza,inj,Quad PF,6+ Mos 07/26/2014, 03/31/2015, 02/12/2016, 02/28/2017  . Pneumococcal Polysaccharide-23 04/22/2016  . Tdap 08/20/2015    Past Medical History:  Diagnosis Date  . Asthma   . Bronchitis   . CHF (congestive heart failure) (HCC)    EF 30% by echo,  EF 50% Nov 2017  . Depression   . Femoral thrombosis (HCC)   . Gout   . Hypertension   . Morbid obesity (HCC)   . Prostatitis   . Seasonal allergies   . Sleep apnea    BiPAP    Tobacco History: Social History   Tobacco Use  Smoking Status Never Smoker  Smokeless Tobacco Never Used   Counseling given: Not Answered   Outpatient Encounter Medications as of 06/07/2017  Medication Sig  . acetaminophen-codeine (TYLENOL #3) 300-30 MG  tablet TAKE 1 TABLET BY MOUTH EVERY 8 HOURS AS NEEDED FOR MODERATE PAIN  . albuterol (PROVENTIL HFA;VENTOLIN HFA) 108 (90 Base) MCG/ACT inhaler Inhale 2 puffs into the lungs every 6 (six) hours as needed for wheezing or shortness of breath.  . allopurinol (ZYLOPRIM) 300 MG tablet Take 1 tablet (300 mg total) by mouth daily.  Marland Kitchen. amLODipine (NORVASC) 5 MG tablet Take 1.5 tablets (7.5 mg total) by mouth daily.  Marland Kitchen. buPROPion (WELLBUTRIN SR) 150 MG 12 hr tablet Take 1 tablet (150 mg total) by mouth 2 (two) times daily.  . cetirizine (ZYRTEC) 10 MG tablet Take 1 tablet (10 mg total) by mouth daily.  . clotrimazole (LOTRIMIN AF JOCK ITCH) 1 % cream Apply 1 application topically 2 (two) times daily. Apply to affected area BID  . cyclobenzaprine (FLEXERIL) 10 MG tablet TAKE 1 TABLET BY MOUTH 3 TIMES DAILY AS NEEDED FOR MUSCLE SPASMS.  . Diclofenac Sodium 3 % GEL Place 1-2 g onto the skin 4 (four) times daily as needed (neck pain).  . fluticasone (FLONASE) 50 MCG/ACT nasal spray Place 1 spray into both nostrils daily.  . Fluticasone-Salmeterol (ADVAIR) 100-50 MCG/DOSE AEPB Inhale 1 puff into the lungs 2 (two) times daily.  . furosemide (LASIX) 40 MG tablet Take 1 tablet (  40 mg total) by mouth daily.  Marland Kitchen gabapentin (NEURONTIN) 300 MG capsule Take 1 capsule (300 mg total) by mouth 3 (three) times daily.  . isosorbide-hydrALAZINE (BIDIL) 20-37.5 MG tablet Take 2 tablets by mouth 3 (three) times daily.  . metoprolol tartrate (LOPRESSOR) 50 MG tablet Take 1 tablet (50 mg total) by mouth 2 (two) times daily.  . Potassium Chloride ER 20 MEQ TBCR Take 1 tablet by mouth daily.  . tamsulosin (FLOMAX) 0.4 MG CAPS capsule TAKE 1 CAPSULE BY MOUTH DAILY AFTER SUPPER.  . [DISCONTINUED] lidocaine (XYLOCAINE) 2 % solution Swish and spit 20 ml 3-4 times a day for mouth pain  . predniSONE (DELTASONE) 50 MG tablet Take 1 tablet (50 mg total) by mouth daily with breakfast. For 5 days for back pain flare ups, repeat as needed no  more every 6 weeks (Patient not taking: Reported on 06/07/2017)  . [DISCONTINUED] cephALEXin (KEFLEX) 500 MG capsule Take 1 capsule (500 mg total) 4 (four) times daily by mouth.   No facility-administered encounter medications on file as of 06/07/2017.      Review of Systems  Constitutional:   No  weight loss, night sweats,  Fevers, chills, fatigue, or  lassitude.  HEENT:   No headaches,  Difficulty swallowing,  Tooth/dental problems, or  Sore throat,                No sneezing, itching, ear ache, nasal congestion, post nasal drip,   CV:  No chest pain,  Orthopnea, PND,  , anasarca, dizziness, palpitations, syncope.   GI  No heartburn, indigestion, abdominal pain, nausea, vomiting, diarrhea, change in bowel habits, loss of appetite, bloody stools.   Resp: No shortness of breath with exertion or at rest.  No excess mucus, no productive cough,  No non-productive cough,  No coughing up of blood.  No change in color of mucus.  No wheezing.  No chest wall deformity  Skin: no rash or lesions.  GU: no dysuria, change in color of urine, no urgency or frequency.  No flank pain, no hematuria   MS:  No joint pain or swelling.  No decreased range of motion.  No back pain.    Physical Exam  BP (!) 142/98 (BP Location: Left Arm, Cuff Size: Normal)   Pulse 63   Ht 5\' 7"  (1.702 m)   Wt (!) 334 lb (151.5 kg)   SpO2 99%   BMI 52.31 kg/m   GEN: A/Ox3; pleasant , NAD, obese    HEENT:  [place]/AT,  EACs-clear, TMs-wnl, NOSE-clear, THROAT-clear, no lesions, no postnasal drip or exudate noted. Class 2-3 MP airway   NECK:  Supple w/ fair ROM; no JVD; normal carotid impulses w/o bruits; no thyromegaly or nodules palpated; no lymphadenopathy.    RESP  Clear  P & A; w/o, wheezes/ rales/ or rhonchi. no accessory muscle use, no dullness to percussion  CARD:  RRR, no m/r/g, tr  peripheral edema, pulses intact, no cyanosis or clubbing.  GI:   Soft & nt; nml bowel sounds; no organomegaly or masses  detected.   Musco: Warm bil, no deformities or joint swelling noted.   Neuro: alert, no focal deficits noted.    Skin: Warm, no lesions or rashes    Lab Results:  CBC  BMET  Imaging: No results found.   Assessment & Plan:   OSA (obstructive sleep apnea) OSA/OHS - controlled on BIPAP  Encouraged on compliance and usage >4hr.  Cont w/ wt loss.   Plan  Patient Instructions  Continue on BIPAP At bedtime  .  Wear each night for at least 4hr .  Do not drive if sleepy .  Good job with weight loss, keep up good work.  Follow up in 6 months with Dr. Vassie LollAlva  Or [place] Teters NP       Morbid obesity (HCC) Wt loss.      Rubye Oaksammy Porchia Sinkler, NP 06/07/2017

## 2017-06-07 NOTE — Assessment & Plan Note (Signed)
OSA/OHS - controlled on BIPAP  Encouraged on compliance and usage >4hr.  Cont w/ wt loss.   Plan  Patient Instructions  Continue on BIPAP At bedtime  .  Wear each night for at least 4hr .  Do not drive if sleepy .  Good job with weight loss, keep up good work.  Follow up in 6 months with Dr. Vassie LollAlva  Or Rondalyn Belford NP

## 2017-06-21 MED FILL — ALLOPURINOL 300 MG TABS: 300 | 30 days supply | Qty: 30 | Fill #4

## 2017-06-21 MED FILL — BIDIL TABLET: 20-37.5 | 30 days supply | Qty: 180 | Fill #3

## 2017-06-21 MED FILL — GABAPENTIN 300 MG CAPSULE: 300 | 30 days supply | Qty: 90 | Fill #3

## 2017-06-21 MED FILL — METOPROLOL TARTRATE 50 MG T: 50 | 30 days supply | Qty: 60 | Fill #5

## 2017-06-21 MED FILL — BUPROPION SR 150 MG TABLET: 150 | 30 days supply | Qty: 60 | Fill #5

## 2017-06-21 MED FILL — ALL DAY ALLERGY 10 MG TAB: 10 | 30 days supply | Qty: 30 | Fill #4

## 2017-06-21 MED FILL — TAMSULOSIN HCL 0.4 MG CAP: 0.4 | 30 days supply | Qty: 30 | Fill #5

## 2017-06-21 MED FILL — CYCLOBENZAPRINE 10 MG TAB: 10 | 20 days supply | Qty: 60 | Fill #5

## 2017-06-29 ENCOUNTER — Encounter: Payer: Self-pay | Admitting: Neurology

## 2017-06-29 ENCOUNTER — Ambulatory Visit: Payer: Medicaid Other | Admitting: Neurology

## 2017-06-29 VITALS — BP 160/110 | HR 87 | Ht 67.0 in | Wt 340.0 lb

## 2017-06-29 DIAGNOSIS — G253 Myoclonus: Secondary | ICD-10-CM

## 2017-06-29 DIAGNOSIS — R259 Unspecified abnormal involuntary movements: Secondary | ICD-10-CM

## 2017-06-29 NOTE — Progress Notes (Signed)
Subjective:    Patient ID: Rickey Calderon is a 52 y.o. male.  HPI     Rickey Foley, MD, PhD [facility] [address] [address] [place], [place] [address]  Dear Dr. Laural Benes,   I saw your patient, Rickey Calderon, upon your kind request, in my neurologic clinic today for initial consultation of concern for seizure-like spells. The patient is unaccompanied by today. As you know, Rickey Calderon is a 52 year old right-handed gentleman with an underlying complex medical history of hypertension, hypertensive cardiomyopathy, chronic kidney disease, chronic combined systolic and diastolic heart failure, depression, low back pain, gout, seasonal allergies, and morbid obesity, who reports a 2 to three-month history of involuntary movements at night during sleep. He reports that he has twitching and for larger movements at time. This does not have a typical time of onset at night. He does not typically correlate a bad dream with it. In fact, he does not recall dreaming except for rare occasions. He has been compliant with his BiPAP. He had a recent appointment with the pulmonary nurse practitioner for his sleep apnea in December. I reviewed the note. He has had no recent medication changes but he is on Wellbutrin for depression. He has been on it for some time. He is not quite clear as to how long he has been on it. He also admits that he actually ran out off the prescription or did not fill and on time recently. He has been off of well. Trend for the past 3 weeks. He is not sure if his twitching or movements at night have improved in the past 2 or 3 weeks since he has been off of Wellbutrin. He does report a history of depression and anxiety and wonders if he is bipolar. He has a history in his family of mental health illness. He is a nonsmoker and does not use alcohol. He drinks caffeine occasionally. Unfortunately, he does not have a very set sleep and wake schedule. He works shifts,  currently his shift is from 4 PM to midnight. As he relies on transportation, he does not get home at the same time every night. Sometimes he does not get home until 6 or 7 as I understand. Bedtime is anywhere from 3 to 4 AM to 7 AM. He has a rise time of around 10. He admits that he does not get the same amount of sleep on any given night. He does not have a history of recurrent headaches are one-sided weakness or numbness or tingling. He has not noticed any tremors. He denies telltale symptoms of restless leg syndrome. He has been on Tylenol No. 3 for chronic pain. He is currently not on it but reports that it was more effective than Advil. He had a split-night sleep study on 06/10/2016 and I reviewed the results. I did not see any evidence of abnormal EEG during the sleep study. He was started on BiPAP therapy and is managed for his sleep apnea by Dr. Clayborn Bigness in pulmonary. He will follow-up with another pulmonologist as I understand. He is on multiple medications. Of note, he is supposed to be on Lasix daily but does not take it regularly.  His Past Medical History Is Significant For: Past Medical History:  Diagnosis Date  . Asthma   . Bronchitis   . CHF (congestive heart failure) (HCC)    EF 30% by echo,  EF 50% Nov 2017  . Depression   . Femoral thrombosis (HCC)   .  Gout   . Hypertension   . Morbid obesity (HCC)   . Prostatitis   . Seasonal allergies   . Sleep apnea    BiPAP    His Past Surgical History Is Significant For: Past Surgical History:  Procedure Laterality Date  . ABDOMINAL AORTAGRAM N/A 07/28/2014   Procedure: ABDOMINAL Ronny FlurryAORTAGRAM;  Surgeon: Chuck Hinthristopher S Dickson, MD;  [facility];  Service: Cardiovascular;  Laterality: N/A;  . LOWER EXTREMITY ANGIOGRAM Bilateral 07/28/2014   Procedure: LOWER EXTREMITY ANGIOGRAM;  Surgeon: Chuck Hinthristopher S Dickson, MD;  [facility];  Service: Cardiovascular;  Laterality: Bilateral;  . THROMBECTOMY FEMORAL ARTERY Right 08/01/2014    Procedure: THROMBECTOMY RIGHT LEG SFA, Anterior tibial, and perineal arteries with intraoperative arteriograms;  Surgeon: Nada LibmanVance W Brabham, MD;  [facility];  Service: Vascular;  Laterality: Right;    His Family History Is Significant For: Family History  Problem Relation Age of Onset  . Diabetes Father   . Hyperlipidemia Father     His Social History Is Significant For: Social History   Socioeconomic History  . Marital status: Married    Spouse name: None  . Number of children: None  . Years of education: None  . Highest education level: None  Social Needs  . Financial resource strain: None  . Food insecurity - worry: None  . Food insecurity - inability: None  . Transportation needs - medical: None  . Transportation needs - non-medical: None  Occupational History  . None  Tobacco Use  . Smoking status: Never Smoker  . Smokeless tobacco: Never Used  Substance and Sexual Activity  . Alcohol use: No  . Drug use: No  . Sexual activity: None  Other Topics Concern  . None  Social History Narrative  . None    His Allergies Are:  No Known Allergies:   His Current Medications Are:  Outpatient Encounter Medications as of 06/29/2017  Medication Sig  . acetaminophen-codeine (TYLENOL #3) 300-30 MG tablet TAKE 1 TABLET BY MOUTH EVERY 8 HOURS AS NEEDED FOR MODERATE PAIN  . albuterol (PROVENTIL HFA;VENTOLIN HFA) 108 (90 Base) MCG/ACT inhaler Inhale 2 puffs into the lungs every 6 (six) hours as needed for wheezing or shortness of breath.  . allopurinol (ZYLOPRIM) 300 MG tablet Take 1 tablet (300 mg total) by mouth daily.  Marland Kitchen. amLODipine (NORVASC) 5 MG tablet Take 1.5 tablets (7.5 mg total) by mouth daily.  Marland Kitchen. buPROPion (WELLBUTRIN SR) 150 MG 12 hr tablet Take 1 tablet (150 mg total) by mouth 2 (two) times daily.  . cetirizine (ZYRTEC) 10 MG tablet Take 1 tablet (10 mg total) by mouth daily.  . clotrimazole (LOTRIMIN AF JOCK ITCH) 1 % cream Apply 1 application topically 2 (two)  times daily. Apply to affected area BID  . cyclobenzaprine (FLEXERIL) 10 MG tablet TAKE 1 TABLET BY MOUTH 3 TIMES DAILY AS NEEDED FOR MUSCLE SPASMS.  . Diclofenac Sodium 3 % GEL Place 1-2 g onto the skin 4 (four) times daily as needed (neck pain).  . fluticasone (FLONASE) 50 MCG/ACT nasal spray Place 1 spray into both nostrils daily.  . Fluticasone-Salmeterol (ADVAIR) 100-50 MCG/DOSE AEPB Inhale 1 puff into the lungs 2 (two) times daily.  . furosemide (LASIX) 40 MG tablet Take 1 tablet (40 mg total) by mouth daily.  Marland Kitchen. gabapentin (NEURONTIN) 300 MG capsule Take 1 capsule (300 mg total) by mouth 3 (three) times daily.  . isosorbide-hydrALAZINE (BIDIL) 20-37.5 MG tablet Take 2 tablets by mouth 3 (three) times daily.  .Marland Kitchen  metoprolol tartrate (LOPRESSOR) 50 MG tablet Take 1 tablet (50 mg total) by mouth 2 (two) times daily.  . Potassium Chloride ER 20 MEQ TBCR Take 1 tablet by mouth daily.  . predniSONE (DELTASONE) 50 MG tablet Take 1 tablet (50 mg total) by mouth daily with breakfast. For 5 days for back pain flare ups, repeat as needed no more every 6 weeks  . tamsulosin (FLOMAX) 0.4 MG CAPS capsule TAKE 1 CAPSULE BY MOUTH DAILY AFTER SUPPER.   No facility-administered encounter medications on file as of 06/29/2017.   :  Review of Systems:  Out of a complete 14 point review of systems, all are reviewed and negative with the exception of these symptoms as listed below: Review of Systems  Neurological:       Pt presents today to discuss jerking movements in his sleep. Pt falls out of bed. Pt uses bipap.    Objective:  Neurological Exam  Physical Exam Physical Examination:   Vitals:   06/29/17 1010 06/29/17 1036  BP: (!) 195/137 (!) 160/110  Pulse: 87    General Examination: The patient is a very pleasant 52 y.o. male in no acute distress. He appears well-developed and well-nourished and adequately groomed.   Of note, he took his BP med in clinic and upon recheck his BP was better.    HEENT: Normocephalic, atraumatic, pupils are equal, round and reactive to light and accommodation. Funduscopic exam is normal with sharp disc margins noted. Mild b/l cataracts noted. Extraocular tracking is good without limitation to gaze excursion or nystagmus noted. Normal smooth pursuit is noted. Hearing is grossly intact. Face is symmetric with normal facial animation and normal facial sensation. Speech is clear with no dysarthria noted. There is no hypophonia. There is no lip, neck/head, jaw or voice tremor. Neck is supple with full range of passive and active motion. There are no carotid bruits on auscultation. Oropharynx exam reveals: mild mouth dryness, adequate dental hygiene and moderate airway crowding. Tongue protrudes centrally and palate elevates symmetrically.    Chest: Clear to auscultation without wheezing, rhonchi or crackles noted.  Heart: S1+S2+0, regular and normal without murmurs, rubs or gallops noted.   Abdomen: Soft, non-tender and non-distended with normal bowel sounds appreciated on auscultation.  Extremities: There is 2+ pitting edema in the distal lower extremities bilaterally. Pedal pulses are intact.  Skin: Warm and dry without trophic changes noted.  Musculoskeletal: exam reveals no obvious joint deformities, tenderness or joint swelling or erythema.   Neurologically:  Mental status: The patient is awake, alert and oriented in all 4 spheres. His immediate and remote memory, attention, language skills and fund of knowledge are appropriate. There is no evidence of aphasia, agnosia, apraxia or anomia. Speech is clear with normal prosody and enunciation. Thought process is linear. Mood is normal and affect is normal.  Cranial nerves II - XII are as described above under HEENT exam. In addition: shoulder shrug is normal with equal shoulder height noted. Motor exam: Normal bulk, strength and tone is noted. There is no drift, tremor or rebound. Romberg is negative.  Reflexes are 1+ in the upper extremities, trace in the lower extremities .Fine motor skills and coordination: intact with normal finger taps, normal hand movements, normal rapid alternating patting, normal foot taps and normal foot agility.  Cerebellar testing: No dysmetria or intention tremor on finger to nose testing. Heel to shin is difficult for him secondary to body habitus.  Sensory exam: intact to light touch in the upper and  lower extremities.  Gait, station and balance: He stands with mild difficulty. Balance is preserved. Gait is unremarkable. He has preserved arm swing. Tandem walk is difficult for him.   Assessment and Plan:    In summary, Eudell Mcphee is a very pleasant 52 y.o.-year old male with an underlying complex medical history of hypertension, hypertensive cardiomyopathy, chronic kidney disease, chronic combined systolic and diastolic heart failure, depression, low back pain, gout, seasonal allergies, and morbid obesity, who presents for consultation of abnormal involuntary movements during sleep including twitching and he has had larger movements and has fallen out of bed. Neurological exam is nonfocal. Overall, the differential diagnosis includes myoclonus, PLMD, REM behavior disorder, nocturnal seizures. The etiology is unclear by his description only. Unfortunately, he is on multiple medications, he may not always take his medications with full compliance. He has been off his Wellbutrin for the past 2-3 weeks he indicates. He also does not take his Lasix regularly. He has significant edema today. He is advised to try not to skip any of his medications. Polypharmacy may also be at play here. He has had Tylenol No. 3 in the past which may cause myoclonic twitching but he indicates that he has not been on it lately. His sleep study report did not indicate any abnormal EEG findings or PLMS. I suggested we proceed with a brain MRI and EEG at this time. He is also encouraged to discuss with  you the possibility of changing his antidepressant from Wellbutrin to another medication, as Wellbutrin can increase the risk for seizures. These are typically however, not confined to nocturnal seizures only. He also is wondering if he should be tested for bipolar disorder as he has a family history of that and is wondering about his medication management. He is encouraged to discuss perhaps referral to a psychiatrist with you as well. He is reassured that his neurological exam is nonfocal. He is encouraged to be fully compliant with his BiPAP machine. He will follow up with pulmonology for this. We will keep them posted as to his test results from our end. He is furthermore advised to adhere to a more set schedule with his sleep. Unfortunately because of his shift work and relying on transportation he does not have currently a very set schedule, plus, he does not currently stay at his home and is living in a hotel. He reports that he has been sleeping in a recliner. We talked about safety at night including putting up rails by the side of his bed.  I will see him routinely after testing is completed and we will keep them posted as to his test results via phone call. I answered all his questions today and he was in agreement with the plan.   Thank you very much for allowing me to participate in the care of this nice patient. If I can be of any further assistance to you please do not hesitate to call me at [phone].  Sincerely,   Rickey Foley, MD, PhD

## 2017-06-29 NOTE — Patient Instructions (Signed)
We will investigate your night time twitching and jerking spells with a brain scan, called MRI and call you with the test results. We will have to schedule you for this on a separate date. This test requires authorization from your insurance, and we will take care of the insurance process. We will also do an EEG (brainwave test), which we will schedule. We will call you with the results.  You may not get enough sleep. Please remember to try to maintain good sleep hygiene, which means: Keep a regular sleep and wake schedule, try not to exercise or have a meal within 2 hours of your bedtime, try to keep your bedroom conducive for sleep, that is, cool and dark, without light distractors such as an illuminated alarm clock, and refrain from watching TV right before sleep or in the middle of the night and do not keep the TV or radio on during the night. Also, try not to use or play on electronic devices at bedtime, such as your cell phone, tablet PC or laptop. If you like to read at bedtime on an electronic device, try to dim the background light as much as possible. Do not eat in the middle of the night.   Wellbutrin can increase your risk for seizures. While I am not sure, what's causing your twitching in your sleep, you may consider changing the Wellbutrin to another medication, just to be on the safe side.  Your exam is normal from the neuro standpoint.

## 2017-06-30 MED FILL — AMLODIPINE BESYLATE 5 MG TA: 5 | 30 days supply | Qty: 45 | Fill #4

## 2017-07-10 ENCOUNTER — Other Ambulatory Visit: Payer: Medicaid Other

## 2017-07-11 ENCOUNTER — Ambulatory Visit
Admission: RE | Admit: 2017-07-11 | Discharge: 2017-07-11 | Disposition: A | Discharge status: Home / Self Care | Payer: Medicaid Other | Source: Ambulatory Visit | Attending: Neurology | Admitting: Neurology

## 2017-07-11 DIAGNOSIS — R259 Unspecified abnormal involuntary movements: Secondary | ICD-10-CM

## 2017-07-11 DIAGNOSIS — G253 Myoclonus: Secondary | ICD-10-CM

## 2017-07-12 ENCOUNTER — Other Ambulatory Visit: Payer: Medicaid Other

## 2017-07-12 ENCOUNTER — Telehealth: Payer: Self-pay

## 2017-07-12 NOTE — Telephone Encounter (Signed)
-----   Message from Huston FoleySaima Athar, MD sent at 07/12/2017  8:44 AM EST ----- MRI brain showed age appropriate findings, but study quality was reported to be suboptimal d/t motion artifacts. Please notify pt. No further action req on this test.  Pt scheduled for EEG and FU soon.  Huston FoleySaima Athar, MD, PhD [facility]

## 2017-07-12 NOTE — Progress Notes (Signed)
MRI brain showed age appropriate findings, but study quality was reported to be suboptimal d/t motion artifacts. Please notify pt. No further action req on this test.  Pt scheduled for EEG and FU soon.  Huston FoleySaima Arrick Dutton, MD, PhD [facility]

## 2017-07-12 NOTE — Telephone Encounter (Signed)
I called pt to discuss his MRI results. No answer, left a message asking him to call me back. 

## 2017-07-18 NOTE — Telephone Encounter (Signed)
I called pt to discuss. No answer, left a message asking him to call me back. 

## 2017-07-19 MED FILL — GABAPENTIN 300 MG CAPSULE: 300 | 30 days supply | Qty: 90 | Fill #4

## 2017-07-19 MED FILL — ALLOPURINOL 300 MG TABS: 300 | 30 days supply | Qty: 30 | Fill #5

## 2017-07-19 MED FILL — BIDIL TABLET: 20-37.5 | 30 days supply | Qty: 180 | Fill #4

## 2017-07-19 MED FILL — ALL DAY ALLERGY 10 MG TAB: 10 | 30 days supply | Qty: 30 | Fill #5

## 2017-07-19 MED FILL — DICLOFENAC SODIUM 3% GEL: 3 | 12 days supply | Qty: 100 | Fill #2

## 2017-07-19 NOTE — Telephone Encounter (Signed)
Pt returned my call. I advised him that his MRI brain showed age appropriate findings but that the study quality was suboptimal due to motion. I reminded pt of his EEG appt. Pt verbalized understanding of results. Pt had no questions at this time but was encouraged to call back if questions arise.

## 2017-07-21 ENCOUNTER — Telehealth: Payer: Self-pay | Admitting: Internal Medicine

## 2017-07-21 ENCOUNTER — Ambulatory Visit: Payer: Medicaid Other | Admitting: Internal Medicine

## 2017-07-21 DIAGNOSIS — M545 Low back pain: Secondary | ICD-10-CM

## 2017-07-21 DIAGNOSIS — F329 Major depressive disorder, single episode, unspecified: Secondary | ICD-10-CM

## 2017-07-21 DIAGNOSIS — G8929 Other chronic pain: Secondary | ICD-10-CM

## 2017-07-21 DIAGNOSIS — F32A Depression, unspecified: Secondary | ICD-10-CM

## 2017-07-21 DIAGNOSIS — I10 Essential (primary) hypertension: Secondary | ICD-10-CM

## 2017-07-21 DIAGNOSIS — M542 Cervicalgia: Secondary | ICD-10-CM

## 2017-07-21 MED ORDER — TAMSULOSIN HCL 0.4 MG PO CAPS: ORAL_CAPSULE | ORAL | 0 refills | Status: DC

## 2017-07-21 MED ORDER — METOPROLOL TARTRATE 50 MG PO TABS: 50.0000 mg | ORAL_TABLET | Freq: Two times a day (BID) | ORAL | 0 refills | Status: DC

## 2017-07-21 MED ORDER — CYCLOBENZAPRINE HCL 10 MG PO TABS: ORAL_TABLET | ORAL | 0 refills | Status: DC

## 2017-07-21 MED ORDER — BUPROPION HCL ER (SR) 150 MG PO TB12: 150.0000 mg | ORAL_TABLET | Freq: Two times a day (BID) | ORAL | 0 refills | Status: DC

## 2017-07-21 MED FILL — METOPROLOL TARTRATE 50 MG T: 50 | 30 days supply | Qty: 60 | Fill #0

## 2017-07-21 MED FILL — BUPROPION SR 150 MG TABLET: 150 | 30 days supply | Qty: 60 | Fill #0

## 2017-07-21 MED FILL — CYCLOBENZAPRINE 10 MG TAB: 10 | 20 days supply | Qty: 60 | Fill #0

## 2017-07-21 MED FILL — TAMSULOSIN HCL 0.4 MG CAP: 0.4 | 30 days supply | Qty: 30 | Fill #0

## 2017-07-21 NOTE — Telephone Encounter (Signed)
Refilled x 30 days, due for OV with PCP 

## 2017-07-21 NOTE — Telephone Encounter (Signed)
Patient called requesting refills on listed medications cyclobenzaprine (FLEXERIL) 10 MG tablet [[id number]][[id number]]  metoprolol tartrate (LOPRESSOR) 50 MG tablet [[id number]][[id number]] buPROPion (WELLBUTRIN SR) 150 MG 12 hr tablet [[id number]][[id number]]  tamsulosin (FLOMAX) 0.4 MG CAPS capsule [[id number]][[id number]] [facility].

## 2017-07-24 ENCOUNTER — Ambulatory Visit: Payer: Medicaid Other | Admitting: Neurology

## 2017-07-24 ENCOUNTER — Telehealth: Payer: Self-pay | Admitting: Neurology

## 2017-07-24 DIAGNOSIS — R259 Unspecified abnormal involuntary movements: Secondary | ICD-10-CM | POA: Diagnosis not present

## 2017-07-24 DIAGNOSIS — G253 Myoclonus: Secondary | ICD-10-CM

## 2017-07-24 NOTE — Procedures (Signed)
    History:  Rickey Calderon is a 52 year old gentleman with a history of episodic jerking at night while sleeping.  The patient is being evaluated for these episodes.  The patient does have a history of hypertension and a cardiomyopathy.  This is a routine EEG.  No skull defects are noted.  Medications include Tylenol 3, albuterol, allopurinol, Norvasc, Wellbutrin, Zyrtec, Flexeril, Flonase, Advair, Lasix, gabapentin, BiDil, Lopressor, potassium, prednisone, and Flomax.  EEG classification: Normal awake  Description of the recording: The background rhythms of this recording consists of a fairly well modulated medium amplitude alpha rhythm of 9 Hz that is reactive to eye opening and closure. As the record progresses, the patient appears to remain in the waking state throughout the recording. Photic stimulation was performed, resulting in a bilateral and symmetric photic driving response. Hyperventilation was also performed, resulting in a minimal buildup of the background rhythm activities without significant slowing seen. At no time during the recording does there appear to be evidence of spike or spike wave discharges or evidence of focal slowing. EKG monitor shows no evidence of cardiac rhythm abnormalities with a heart rate of 60.  Impression: This is a normal EEG recording in the waking state. No evidence of ictal or interictal discharges are seen.

## 2017-07-24 NOTE — Telephone Encounter (Signed)
Please call and advise the patient that the EEG or brain wave test we performed was reported as normal in the awake state. We checked for abnormal electrical discharges in the brain waves and the report suggested normal findings. No further action is required on this test at this time. Please remind patient to keep any upcoming appointments or tests and to call us with any interim questions, concerns, problems or updates. Thanks,  Rhodes Calvert, MD, PhD  

## 2017-07-25 NOTE — Telephone Encounter (Signed)
I called pt, no answer, left a detailed message, per DPR, advising him that his EEG was normal, no further action is required, and I reminded pt to keep any upcoming appts or tests and to call us with any interim questions or concerns.

## 2017-08-01 ENCOUNTER — Ambulatory Visit: Payer: Medicaid Other | Attending: Internal Medicine | Admitting: Internal Medicine

## 2017-08-01 ENCOUNTER — Encounter: Payer: Self-pay | Admitting: Pharmacist

## 2017-08-01 ENCOUNTER — Encounter: Payer: Self-pay | Admitting: Internal Medicine

## 2017-08-01 VITALS — BP 142/90 | HR 61 | Temp 97.7°F | Resp 16 | Ht 67.0 in | Wt 333.0 lb

## 2017-08-01 DIAGNOSIS — G479 Sleep disorder, unspecified: Secondary | ICD-10-CM

## 2017-08-01 DIAGNOSIS — I5042 Chronic combined systolic (congestive) and diastolic (congestive) heart failure: Secondary | ICD-10-CM | POA: Diagnosis not present

## 2017-08-01 DIAGNOSIS — G4733 Obstructive sleep apnea (adult) (pediatric): Secondary | ICD-10-CM | POA: Diagnosis not present

## 2017-08-01 DIAGNOSIS — J4530 Mild persistent asthma, uncomplicated: Secondary | ICD-10-CM | POA: Diagnosis not present

## 2017-08-01 DIAGNOSIS — Z7951 Long term (current) use of inhaled steroids: Secondary | ICD-10-CM | POA: Insufficient documentation

## 2017-08-01 DIAGNOSIS — I10 Essential (primary) hypertension: Secondary | ICD-10-CM | POA: Diagnosis not present

## 2017-08-01 DIAGNOSIS — M1000 Idiopathic gout, unspecified site: Secondary | ICD-10-CM

## 2017-08-01 DIAGNOSIS — N183 Chronic kidney disease, stage 3 (moderate): Secondary | ICD-10-CM | POA: Insufficient documentation

## 2017-08-01 DIAGNOSIS — Z6841 Body Mass Index (BMI) 40.0 and over, adult: Secondary | ICD-10-CM | POA: Insufficient documentation

## 2017-08-01 DIAGNOSIS — F32A Depression, unspecified: Secondary | ICD-10-CM

## 2017-08-01 DIAGNOSIS — L84 Corns and callosities: Secondary | ICD-10-CM | POA: Diagnosis not present

## 2017-08-01 DIAGNOSIS — R21 Rash and other nonspecific skin eruption: Secondary | ICD-10-CM | POA: Diagnosis not present

## 2017-08-01 DIAGNOSIS — I130 Hypertensive heart and chronic kidney disease with heart failure and stage 1 through stage 4 chronic kidney disease, or unspecified chronic kidney disease: Secondary | ICD-10-CM | POA: Insufficient documentation

## 2017-08-01 DIAGNOSIS — Z79899 Other long term (current) drug therapy: Secondary | ICD-10-CM | POA: Diagnosis not present

## 2017-08-01 DIAGNOSIS — J449 Chronic obstructive pulmonary disease, unspecified: Secondary | ICD-10-CM | POA: Insufficient documentation

## 2017-08-01 DIAGNOSIS — F329 Major depressive disorder, single episode, unspecified: Secondary | ICD-10-CM | POA: Diagnosis not present

## 2017-08-01 DIAGNOSIS — M545 Low back pain, unspecified: Secondary | ICD-10-CM

## 2017-08-01 DIAGNOSIS — Z760 Encounter for issue of repeat prescription: Secondary | ICD-10-CM | POA: Diagnosis not present

## 2017-08-01 DIAGNOSIS — G8929 Other chronic pain: Secondary | ICD-10-CM | POA: Diagnosis not present

## 2017-08-01 DIAGNOSIS — I429 Cardiomyopathy, unspecified: Secondary | ICD-10-CM | POA: Diagnosis not present

## 2017-08-01 MED ORDER — AMLODIPINE BESYLATE 5 MG PO TABS: 7.5000 mg | ORAL_TABLET | Freq: Every day | ORAL | 5 refills | Status: DC

## 2017-08-01 MED ORDER — FLUTICASONE-SALMETEROL 100-50 MCG/DOSE IN AEPB: 1.0000 | INHALATION_SPRAY | Freq: Two times a day (BID) | RESPIRATORY_TRACT | 5 refills | Status: DC

## 2017-08-01 MED ORDER — FUROSEMIDE 40 MG PO TABS: 40.0000 mg | ORAL_TABLET | Freq: Every day | ORAL | 5 refills | Status: DC

## 2017-08-01 MED ORDER — ISOSORB DINITRATE-HYDRALAZINE 20-37.5 MG PO TABS: 2.0000 | ORAL_TABLET | Freq: Three times a day (TID) | ORAL | 5 refills | Status: DC

## 2017-08-01 MED ORDER — METOPROLOL TARTRATE 50 MG PO TABS: 50.0000 mg | ORAL_TABLET | Freq: Two times a day (BID) | ORAL | 6 refills | Status: DC

## 2017-08-01 MED ORDER — ALLOPURINOL 300 MG PO TABS: 300.0000 mg | ORAL_TABLET | Freq: Every day | ORAL | 5 refills | Status: DC

## 2017-08-01 MED ORDER — SERTRALINE HCL 50 MG PO TABS: ORAL_TABLET | ORAL | 0 refills | Status: DC

## 2017-08-01 MED ORDER — GABAPENTIN 300 MG PO CAPS: 300.0000 mg | ORAL_CAPSULE | Freq: Three times a day (TID) | ORAL | 5 refills | Status: DC

## 2017-08-01 MED ORDER — ACETAMINOPHEN-CODEINE #3 300-30 MG PO TABS: ORAL_TABLET | ORAL | 2 refills | Status: DC

## 2017-08-01 MED ORDER — TAMSULOSIN HCL 0.4 MG PO CAPS: ORAL_CAPSULE | ORAL | 6 refills | Status: DC

## 2017-08-01 NOTE — Patient Instructions (Signed)
Wean off Wellbutrin.  Take 1 tablet daily for 2 weeks then stop taking.  Start Zoloft after you have sttopped Wellbutrin.  I have referred you to podiatry for your foot.

## 2017-08-01 NOTE — Progress Notes (Signed)
Patient ID: Rickey Calderon, male    DOB: 25-Jun-1966  MRN: [id number]  CC: Hypertension and Medication Refill   Subjective: Rickey Calderon is a 52 y.o. male who presents for chronic disease management. His concerns today include:  Pt with hx of HTN, OSA on BiPAP, CKD 3, gout, asthma (mild intermittent) and mild COPD (PFTs 04/2016), chronic LBP (on Tylenol #3, Flexeril and Gabapentin), obesity, depression,cardiomyopathy with EF inc from 25% to 45-50% 04/2016 (thought to be due to HTN. 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  1.  Since last visit patient was seen by neurology, Dr. Frances Furbish, for evaluation of possible sleep disturbance.  EEG normal.  MRI of the head revealed mild chronic microvascular changes. Recommended changing Wellbutrin, if possible. Pt taking consistently -sleeping in recliner to avoid falling out of bed.  Still talks in sleep -using BiPAP consistently  2.  Referral to derm for rash posterior LT thigh.  No appt as yet.  3.  HTN:  Did no take meds as yet this a.m. -no SOB or CP. + LE edema.  Not taking Lasix every day.  Takes K+ supplement only when he takes Lasix  4.  C/o painful lumps on both heels x 2 mths  5.  Chronic LBP: request RF on Tylenol #3  Patient Active Problem List   Diagnosis Date Noted  . Hypertensive cardiomyopathy, without heart failure (HCC) 05/17/2016  . 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 OSA (obstructive sleep apnea) 04/22/2016  . Exertional dyspnea 04/22/2016  . Obesity hypoventilation syndrome (HCC) 04/22/2016  . CKD (chronic kidney disease) stage 3, GFR 30-59 ml/min (HCC) 02/02/2016  . Chronic combined systolic and diastolic heart failure, NYHA class 2 (HCC) 09/29/2015  . Chronic neck pain 09/21/2015  . Asthma   . Depression 07/10/2015  . Financial difficulties 04/30/2015  . Morbid  obesity (HCC) 03/31/2015  . Chronic low back pain 03/31/2015  . OSA on CPAP 07/25/2014  . Essential hypertension   . Gout   . Seasonal allergies   . Chronic prostatitis      Current Outpatient Medications on File Prior to Visit  Medication Sig Dispense Refill  . albuterol (PROVENTIL HFA;VENTOLIN HFA) 108 (90 Base) MCG/ACT inhaler Inhale 2 puffs into the lungs every 6 (six) hours as needed for wheezing or shortness of breath. 1 Inhaler 5  . buPROPion (WELLBUTRIN SR) 150 MG 12 hr tablet Take 1 tablet (150 mg total) by mouth 2 (two) times daily. 60 tablet 0  . cetirizine (ZYRTEC) 10 MG tablet Take 1 tablet (10 mg total) by mouth daily. 30 tablet 5  . cyclobenzaprine (FLEXERIL) 10 MG tablet TAKE 1 TABLET BY MOUTH 3 TIMES DAILY AS NEEDED FOR MUSCLE SPASMS. 60 tablet 0  . Diclofenac Sodium 3 % GEL Place 1-2 g onto the skin 4 (four) times daily as needed (neck pain). 100 g 2  . fluticasone (FLONASE) 50 MCG/ACT nasal spray Place 1 spray into both nostrils daily. 16 g 6  . Potassium Chloride ER 20 MEQ TBCR Take 1 tablet by mouth daily. 30 tablet 5   No current facility-administered medications on file prior to visit.     No Known Allergies  Social History   Socioeconomic History  . Marital status: Married    Spouse name: Not on file  . Number of children: Not on file  . Years of education: Not  on file  . Highest education level: Not on file  Social Needs  . Financial resource strain: Not on file  . Food insecurity - worry: Not on file  . Food insecurity - inability: Not on file  . Transportation needs - medical: Not on file  . Transportation needs - non-medical: Not on file  Occupational History  . Not on file  Tobacco Use  . Smoking status: Never Smoker  . Smokeless tobacco: Never Used  Substance and Sexual Activity  . Alcohol use: No  . Drug use: No  . Sexual activity: Not on file  Other Topics Concern  . Not on file  Social History Narrative  . Not on file    Family  History  Problem Relation Age of Onset  . Diabetes Father   . Hyperlipidemia Father     Past Surgical History:  Procedure Laterality Date  . ABDOMINAL AORTAGRAM N/A 07/28/2014   Procedure: ABDOMINAL Ronny Flurry;  Surgeon: Chuck Hint, MD;  Location: [facility] CATH LAB;  Service: Cardiovascular;  Laterality: N/A;  . LOWER EXTREMITY ANGIOGRAM Bilateral 07/28/2014   Procedure: LOWER EXTREMITY ANGIOGRAM;  Surgeon: Chuck Hint, MD;  Location: [facility] CATH LAB;  Service: Cardiovascular;  Laterality: Bilateral;  . THROMBECTOMY FEMORAL ARTERY Right 08/01/2014   Procedure: THROMBECTOMY RIGHT LEG SFA, Anterior tibial, and perineal arteries with intraoperative arteriograms;  Surgeon: Nada Libman, MD;  Location: [facility] OR;  Service: Vascular;  Laterality: Right;    ROS: Review of Systems Negative except as stated above   PHYSICAL EXAM: BP (!) 142/90   Pulse 61   Temp 97.7 F (36.5 C) (Oral)   Resp 16   Ht 5\' 7"  (1.702 m)   Wt (!) 333 lb (151 kg)   SpO2 96%   BMI 52.16 kg/m   Wt Readings from Last 3 Encounters:  08/01/17 (!) 333 lb (151 kg)  06/29/17 (!) 340 lb (154.2 kg)  06/07/17 (!) 334 lb (151.5 kg)    Physical Exam General appearance - alert, well appearing, and in no distress Mental status - alert, oriented to person, place, and time, normal mood, behavior, speech, dress, motor activity, and thought processes Mouth - mucous membranes moist, pharynx normal without lesions Neck - supple, no significant adenopathy Chest - clear to auscultation, no wheezes, rales or rhonchi, symmetric air entry Heart - normal rate, regular rhythm, normal S1, S2, no murmurs, rubs, clicks or gallops Musculoskeletal - no tenderness on palpation of LS spine Extremities - 1+ LE edema Skin: Feet: + callous on both heels   Chemistry      Component Value Date/Time   NA 140 12/19/2016 0947   K 3.8 12/19/2016 0947   CL 99 12/19/2016 0947   CO2 26 12/19/2016 0947   BUN 23 12/19/2016 0947   CREATININE  1.46 (H) 12/19/2016 0947   CREATININE 1.24 02/12/2016 1632      Component Value Date/Time   CALCIUM 8.5 (L) 12/19/2016 0947   ALKPHOS 75 12/19/2016 0947   AST 16 12/19/2016 0947   ALT 18 12/19/2016 0947   BILITOT 0.4 12/19/2016 0947      ASSESSMENT AND PLAN: 1. Essential hypertension Not at goal.  Patient will take his medicines when he returns home today -Encouraged him to take the Lasix every day as he does have significant edema in the lower extremities - isosorbide-hydrALAZINE (BIDIL) 20-37.5 MG tablet; Take 2 tablets by mouth 3 (three) times daily.  Dispense: 180 tablet; Refill: 5 - furosemide (LASIX) 40 MG tablet; Take  1 tablet (40 mg total) by mouth daily.  Dispense: 30 tablet; Refill: 5 - metoprolol tartrate (LOPRESSOR) 50 MG tablet; Take 1 tablet (50 mg total) by mouth 2 (two) times daily.  Dispense: 60 tablet; Refill: 6 - amLODipine (NORVASC) 5 MG tablet; Take 1.5 tablets (7.5 mg total) by mouth daily.  Dispense: 45 tablet; Refill: 5  2. Chronic right-sided low back pain without sciatica -Control Subst Prescribing Agreement 11/2016 RF Tylenol #3 Pt denies any significant side effects from the medication - gabapentin (NEURONTIN) 300 MG capsule; Take 1 capsule (300 mg total) by mouth 3 (three) times daily.  Dispense: 90 capsule; Refill: 5 - acetaminophen-codeine (TYLENOL #3) 300-30 MG tablet; TAKE 1 TABLET BY MOUTH EVERY 8 HOURS AS NEEDED FOR MODERATE PAIN  Dispense: 90 tablet; Refill: 2  3. Pre-ulcerative corn or callous - Ambulatory referral to Podiatry  4. Morbid obesity, unspecified obesity type (HCC) -encourage to increase physical activity   5. Depression, unspecified depression type Advise to wean off Wellbutrin. Will take 1 tab daily for 2 wks then d/c.  Then start Zoloft instead - sertraline (ZOLOFT) 50 MG tablet; 1/2 tab daily x 2 wks then 1 tab daily  Dispense: 30 tablet; Refill: 0  6. Disturbance in sleep behavior   7. Idiopathic gout, unspecified  chronicity, unspecified site   8. Mild persistent asthma without complication - Fluticasone-Salmeterol (ADVAIR) 100-50 MCG/DOSE AEPB; Inhale 1 puff into the lungs 2 (two) times daily.  Dispense: 60 each; Refill: 5  Patient was given the opportunity to ask questions.  Patient verbalized understanding of the plan and was able to repeat key elements of the plan.   Orders Placed This Encounter  Procedures  . Ambulatory referral to Podiatry     Requested Prescriptions   Signed Prescriptions Disp Refills  . tamsulosin (FLOMAX) 0.4 MG CAPS capsule 30 capsule 6    Sig: TAKE 1 CAPSULE BY MOUTH DAILY AFTER SUPPER.  Marland Kitchen. Fluticasone-Salmeterol (ADVAIR) 100-50 MCG/DOSE AEPB 60 each 5    Sig: Inhale 1 puff into the lungs 2 (two) times daily.  . isosorbide-hydrALAZINE (BIDIL) 20-37.5 MG tablet 180 tablet 5    Sig: Take 2 tablets by mouth 3 (three) times daily.  Marland Kitchen. gabapentin (NEURONTIN) 300 MG capsule 90 capsule 5    Sig: Take 1 capsule (300 mg total) by mouth 3 (three) times daily.  . furosemide (LASIX) 40 MG tablet 30 tablet 5    Sig: Take 1 tablet (40 mg total) by mouth daily.  Marland Kitchen. allopurinol (ZYLOPRIM) 300 MG tablet 30 tablet 5    Sig: Take 1 tablet (300 mg total) by mouth daily.  . metoprolol tartrate (LOPRESSOR) 50 MG tablet 60 tablet 6    Sig: Take 1 tablet (50 mg total) by mouth 2 (two) times daily.  Marland Kitchen. amLODipine (NORVASC) 5 MG tablet 45 tablet 5    Sig: Take 1.5 tablets (7.5 mg total) by mouth daily.  Marland Kitchen. acetaminophen-codeine (TYLENOL #3) 300-30 MG tablet 90 tablet 2    Sig: TAKE 1 TABLET BY MOUTH EVERY 8 HOURS AS NEEDED FOR MODERATE PAIN  . sertraline (ZOLOFT) 50 MG tablet 30 tablet 0    Sig: 1/2 tab daily x 2 wks then 1 tab daily    Return in about 3 months (around 10/29/2017).  Jonah Blueeborah Johnson, MD, FACP

## 2017-08-01 NOTE — Progress Notes (Signed)
PA completed and approved for Tylenol #3. Approval #[id number]

## 2017-08-08 MED FILL — ACETAMINOPHEN/COD #3 TABLET: 300-30 | 30 days supply | Qty: 90 | Fill #0

## 2017-08-11 MED FILL — AMLODIPINE BESYLATE 5 MG TA: 5 | 30 days supply | Qty: 45 | Fill #5

## 2017-08-11 MED FILL — SERTRALINE HCL 50 MG TABLET: 50 | 30 days supply | Qty: 23 | Fill #0

## 2017-08-21 ENCOUNTER — Encounter: Payer: Self-pay | Admitting: Podiatry

## 2017-08-21 ENCOUNTER — Ambulatory Visit: Payer: Medicaid Other | Admitting: Podiatry

## 2017-08-21 VITALS — BP 163/105 | HR 61 | Resp 16

## 2017-08-21 DIAGNOSIS — M79674 Pain in right toe(s): Secondary | ICD-10-CM

## 2017-08-21 DIAGNOSIS — R0989 Other specified symptoms and signs involving the circulatory and respiratory systems: Secondary | ICD-10-CM

## 2017-08-21 DIAGNOSIS — M79675 Pain in left toe(s): Secondary | ICD-10-CM

## 2017-08-21 DIAGNOSIS — L84 Corns and callosities: Secondary | ICD-10-CM

## 2017-08-21 DIAGNOSIS — B351 Tinea unguium: Secondary | ICD-10-CM | POA: Diagnosis not present

## 2017-08-21 DIAGNOSIS — M79676 Pain in unspecified toe(s): Secondary | ICD-10-CM

## 2017-08-21 NOTE — Progress Notes (Signed)
   Subjective:    Patient ID: Rickey Calderon, male    DOB: 1965-07-29, 52 y.o.   MRN: [id number]  HPI    Review of Systems  Respiratory: Positive for apnea.   Musculoskeletal: Positive for arthralgias.  All other systems reviewed and are negative.      Objective:   Physical Exam        Assessment & Plan:

## 2017-08-21 NOTE — Patient Instructions (Signed)
Look at getting fungi-nail for your toenails

## 2017-08-21 NOTE — Progress Notes (Signed)
Subjective:   Patient ID: Rickey Calderon, male   DOB: 52 y.o.   MRN: [id number]   HPI 52 year old male presents the office today for concerns of painful callus to the bottom of the right heel is been ongoing for last several weeks.  He states he is wearing a sneaker he can feel it rubbing on the bottom of his heel causing irritation and since then has been sore.  He denies any drainage or open sores to the area.  Is also concern for possible toenail fungus of his nails are thick and discolored.  The left big toenail he states he did take off the entire nail but did not cause any bleeding as it was very loose.  The nails do get painful and irritated inside his shoes.  He does have history of blood clot in the right leg but currently denies any symptoms.  He has no other concerns today  Review of Systems  All other systems reviewed and are negative.  Past Medical History:  Diagnosis Date  . Asthma   . Bronchitis   . CHF (congestive heart failure) (HCC)    EF 30% by echo,  EF 50% Nov 2017  . Depression   . Femoral thrombosis (HCC)   . Gout   . Hypertension   . Morbid obesity (HCC)   . Prostatitis   . Seasonal allergies   . Sleep apnea    BiPAP    Past Surgical History:  Procedure Laterality Date  . ABDOMINAL AORTAGRAM N/A 07/28/2014   Procedure: ABDOMINAL Ronny Flurry;  Surgeon: Chuck Hint, MD;  Location: [facility] CATH LAB;  Service: Cardiovascular;  Laterality: N/A;  . LOWER EXTREMITY ANGIOGRAM Bilateral 07/28/2014   Procedure: LOWER EXTREMITY ANGIOGRAM;  Surgeon: Chuck Hint, MD;  Location: [facility] CATH LAB;  Service: Cardiovascular;  Laterality: Bilateral;  . THROMBECTOMY FEMORAL ARTERY Right 08/01/2014   Procedure: THROMBECTOMY RIGHT LEG SFA, Anterior tibial, and perineal arteries with intraoperative arteriograms;  Surgeon: Nada Libman, MD;  Location: [facility] OR;  Service: Vascular;  Laterality: Right;     Current Outpatient Medications:  .  acetaminophen-codeine (TYLENOL #3)  300-30 MG tablet, TAKE 1 TABLET BY MOUTH EVERY 8 HOURS AS NEEDED FOR MODERATE PAIN, Disp: 90 tablet, Rfl: 2 .  albuterol (PROVENTIL HFA;VENTOLIN HFA) 108 (90 Base) MCG/ACT inhaler, Inhale 2 puffs into the lungs every 6 (six) hours as needed for wheezing or shortness of breath., Disp: 1 Inhaler, Rfl: 5 .  allopurinol (ZYLOPRIM) 300 MG tablet, Take 1 tablet (300 mg total) by mouth daily., Disp: 30 tablet, Rfl: 5 .  amLODipine (NORVASC) 5 MG tablet, Take 1.5 tablets (7.5 mg total) by mouth daily., Disp: 45 tablet, Rfl: 5 .  buPROPion (WELLBUTRIN SR) 150 MG 12 hr tablet, Take 1 tablet (150 mg total) by mouth 2 (two) times daily., Disp: 60 tablet, Rfl: 0 .  cetirizine (ZYRTEC) 10 MG tablet, Take 1 tablet (10 mg total) by mouth daily., Disp: 30 tablet, Rfl: 5 .  cyclobenzaprine (FLEXERIL) 10 MG tablet, TAKE 1 TABLET BY MOUTH 3 TIMES DAILY AS NEEDED FOR MUSCLE SPASMS., Disp: 60 tablet, Rfl: 0 .  Diclofenac Sodium 3 % GEL, Place 1-2 g onto the skin 4 (four) times daily as needed (neck pain)., Disp: 100 g, Rfl: 2 .  fluticasone (FLONASE) 50 MCG/ACT nasal spray, Place 1 spray into both nostrils daily., Disp: 16 g, Rfl: 6 .  Fluticasone-Salmeterol (ADVAIR) 100-50 MCG/DOSE AEPB, Inhale 1 puff into the lungs 2 (two) times daily., Disp: 60  each, Rfl: 5 .  furosemide (LASIX) 40 MG tablet, Take 1 tablet (40 mg total) by mouth daily., Disp: 30 tablet, Rfl: 5 .  gabapentin (NEURONTIN) 300 MG capsule, Take 1 capsule (300 mg total) by mouth 3 (three) times daily., Disp: 90 capsule, Rfl: 5 .  isosorbide-hydrALAZINE (BIDIL) 20-37.5 MG tablet, Take 2 tablets by mouth 3 (three) times daily., Disp: 180 tablet, Rfl: 5 .  metoprolol tartrate (LOPRESSOR) 50 MG tablet, Take 1 tablet (50 mg total) by mouth 2 (two) times daily., Disp: 60 tablet, Rfl: 6 .  Potassium Chloride ER 20 MEQ TBCR, Take 1 tablet by mouth daily., Disp: 30 tablet, Rfl: 5 .  sertraline (ZOLOFT) 50 MG tablet, 1/2 tab daily x 2 wks then 1 tab daily, Disp: 30  tablet, Rfl: 0 .  tamsulosin (FLOMAX) 0.4 MG CAPS capsule, TAKE 1 CAPSULE BY MOUTH DAILY AFTER SUPPER., Disp: 30 capsule, Rfl: 6  No Known Allergies  Social History   Socioeconomic History  . Marital status: Married    Spouse name: Not on file  . Number of children: Not on file  . Years of education: Not on file  . Highest education level: Not on file  Social Needs  . Financial resource strain: Not on file  . Food insecurity - worry: Not on file  . Food insecurity - inability: Not on file  . Transportation needs - medical: Not on file  . Transportation needs - non-medical: Not on file  Occupational History  . Not on file  Tobacco Use  . Smoking status: Never Smoker  . Smokeless tobacco: Never Used  Substance and Sexual Activity  . Alcohol use: No  . Drug use: No  . Sexual activity: Not on file  Other Topics Concern  . Not on file  Social History Narrative  . Not on file          Objective:  Physical Exam  General: AAO x3, NAD  Dermatological: Nails are hypertrophic, dystrophic, brittle, discolored, elongated 10. No surrounding redness or drainage. Tenderness nails 1-5 bilaterally except for the left hallux toenail which is not present.  Hyperkeratotic lesion present to the right plantar heel there is dried blood.  Upon debridement there is no underlying ulceration, drainage or any signs of infection noted.  Dry skin present bilaterally.  No open lesions or pre-ulcerative lesions are identified today.       Vascular: DP pulses 2/4, PT pulse 1/4 but there is chronic bilateral ankle edema which is limiting evaluation.  Refill time is immediate to all the digits.  There is no pain with calf compression, swelling, warmth.  Neruologic: Grossly intact via light touch bilateral. Vibratory intact via tuning fork bilateral. Protective threshold with Semmes Wienstein monofilament intact to all pedal sites bilateral. Patellar and Achilles deep tendon reflexes 2+ bilateral. No  Babinski or clonus noted bilateral.   Musculoskeletal: No gross boney pedal deformities bilateral. No pain, crepitus, or limitation noted with foot and ankle range of motion bilateral. Muscular strength 5/5 in all groups tested bilateral.  Gait: Unassisted, Nonantalgic.       Assessment:   Hyperkeratotic lesion right plantar heel, symptomatic onychomycosis/onychodystrophy    Plan:  -Treatment options discussed including all alternatives, risks, and complications -Etiology of symptoms were discussed -I debrided the right hyperkeratotic lesion without any complications or bleeding -Regards the toenail fungus I sharply debrided the nails x9 without any complications or bleeding.  We discussed treatment options for this given his kidney function hold off on oral  medication.  Discussed topical treatment.  Discussed success rates with this however.  He can start with over-the-counter Fungi-Nail. -I did an ABI in the office today which was normal.  The right side was 1.2 the right was 1.14. -Moisturizer to the feet daily. -Daily foot inspection  -RTC 3 months or sooner if needed.  Vivi Barrack DPM

## 2017-09-07 MED FILL — GABAPENTIN 300 MG CAPSULE: 300 | 30 days supply | Qty: 90 | Fill #5

## 2017-09-07 MED FILL — CYCLOBENZAPRINE 10 MG TAB: 10 | 20 days supply | Qty: 60 | Fill #0

## 2017-09-07 MED FILL — AMLODIPINE BESYLATE 5 MG TA: 5 | 30 days supply | Qty: 45 | Fill #0

## 2017-09-07 MED FILL — BIDIL TABLET: 20-37.5 | 30 days supply | Qty: 180 | Fill #5

## 2017-09-07 MED FILL — METOPROLOL TARTRATE 50 MG T: 50 | 30 days supply | Qty: 60 | Fill #0

## 2017-09-07 MED FILL — TAMSULOSIN HCL 0.4 MG CAP: 0.4 | 30 days supply | Qty: 30 | Fill #0

## 2017-09-07 MED FILL — SERTRALINE HCL 50 MG TABLET: 50 | 9 days supply | Qty: 7 | Fill #1

## 2017-09-07 MED FILL — ACETAMINOPHEN/COD #3 TABLET: 300-30 | 30 days supply | Qty: 90 | Fill #1

## 2017-09-07 MED FILL — ALLOPURINOL 300 MG TABS: 300 | 30 days supply | Qty: 30 | Fill #0

## 2017-09-19 ENCOUNTER — Other Ambulatory Visit: Payer: Self-pay | Admitting: Internal Medicine

## 2017-09-19 DIAGNOSIS — F32A Depression, unspecified: Secondary | ICD-10-CM

## 2017-09-19 DIAGNOSIS — F329 Major depressive disorder, single episode, unspecified: Secondary | ICD-10-CM

## 2017-09-19 MED FILL — SERTRALINE HCL 50 MG TABLET: 50 | 30 days supply | Qty: 30 | Fill #0

## 2017-09-22 ENCOUNTER — Other Ambulatory Visit: Payer: Self-pay

## 2017-09-22 DIAGNOSIS — J302 Other seasonal allergic rhinitis: Secondary | ICD-10-CM

## 2017-09-22 MED ORDER — CETIRIZINE HCL 10 MG PO TABS: 10.0000 mg | ORAL_TABLET | Freq: Every day | ORAL | 3 refills | Status: AC

## 2017-09-22 MED FILL — CETIRIZINE HCL 10 MG TABS: 10 | 30 days supply | Qty: 30 | Fill #0

## 2017-09-27 ENCOUNTER — Ambulatory Visit: Payer: Medicaid Other | Admitting: Neurology

## 2017-10-17 ENCOUNTER — Ambulatory Visit: Payer: Medicaid Other | Admitting: Neurology

## 2017-10-19 ENCOUNTER — Other Ambulatory Visit: Payer: Self-pay | Admitting: Internal Medicine

## 2017-10-19 DIAGNOSIS — F32A Depression, unspecified: Secondary | ICD-10-CM

## 2017-10-19 DIAGNOSIS — F329 Major depressive disorder, single episode, unspecified: Secondary | ICD-10-CM

## 2017-10-19 MED FILL — GABAPENTIN 300 MG CAPSULE: 300 | 30 days supply | Qty: 90 | Fill #0

## 2017-10-19 MED FILL — SERTRALINE HCL 50 MG TABS: 50 | 30 days supply | Qty: 30 | Fill #0

## 2017-10-19 MED FILL — TAMSULOSIN HCL 0.4 MG CAP: 0.4 | 30 days supply | Qty: 30 | Fill #1

## 2017-10-19 MED FILL — BIDIL TABLET: 20-37.5 | 30 days supply | Qty: 180 | Fill #0

## 2017-10-19 MED FILL — CETIRIZINE HCL 10 MG TABS: 10 | 30 days supply | Qty: 30 | Fill #1

## 2017-10-19 MED FILL — METOPROLOL TARTRATE 50 MG T: 50 | 30 days supply | Qty: 60 | Fill #0

## 2017-10-19 MED FILL — AMLODIPINE BESYLATE 5 MG TA: 5 | 30 days supply | Qty: 45 | Fill #1

## 2017-10-19 MED FILL — ALLOPURINOL 300 MG TABS: 300 | 30 days supply | Qty: 30 | Fill #0

## 2017-10-20 ENCOUNTER — Other Ambulatory Visit: Payer: Self-pay

## 2017-10-20 DIAGNOSIS — M545 Low back pain: Principal | ICD-10-CM

## 2017-10-20 DIAGNOSIS — G8929 Other chronic pain: Secondary | ICD-10-CM

## 2017-10-20 DIAGNOSIS — M542 Cervicalgia: Secondary | ICD-10-CM

## 2017-10-20 MED ORDER — CYCLOBENZAPRINE HCL 10 MG PO TABS
ORAL_TABLET | ORAL | 0 refills | Status: DC
Start: 2017-10-20 — End: 2017-11-22

## 2017-10-20 MED FILL — CYCLOBENZAPRINE 10 MG TAB: 10 | 20 days supply | Qty: 60 | Fill #0

## 2017-10-24 ENCOUNTER — Telehealth: Payer: Self-pay | Admitting: Internal Medicine

## 2017-10-24 NOTE — Telephone Encounter (Signed)
Brooke from [facility] called to inform pcp that patient no showed appointment today with them.

## 2017-10-30 ENCOUNTER — Ambulatory Visit: Payer: Medicaid Other | Admitting: Internal Medicine

## 2017-11-21 ENCOUNTER — Ambulatory Visit: Payer: Medicaid Other | Admitting: Podiatry

## 2017-11-21 ENCOUNTER — Encounter (HOSPITAL_COMMUNITY): Payer: Self-pay

## 2017-11-21 ENCOUNTER — Emergency Department (HOSPITAL_COMMUNITY)
Admission: EM | Admit: 2017-11-21 | Discharge: 2017-11-22 | Disposition: A | Discharge status: Home / Self Care | Payer: Medicaid Other | Attending: Emergency Medicine | Admitting: Emergency Medicine

## 2017-11-21 DIAGNOSIS — N3000 Acute cystitis without hematuria: Secondary | ICD-10-CM | POA: Insufficient documentation

## 2017-11-21 DIAGNOSIS — N183 Chronic kidney disease, stage 3 (moderate): Secondary | ICD-10-CM | POA: Insufficient documentation

## 2017-11-21 DIAGNOSIS — J45909 Unspecified asthma, uncomplicated: Secondary | ICD-10-CM | POA: Diagnosis not present

## 2017-11-21 DIAGNOSIS — I110 Hypertensive heart disease with heart failure: Secondary | ICD-10-CM | POA: Diagnosis not present

## 2017-11-21 DIAGNOSIS — R42 Dizziness and giddiness: Secondary | ICD-10-CM | POA: Diagnosis present

## 2017-11-21 DIAGNOSIS — I129 Hypertensive chronic kidney disease with stage 1 through stage 4 chronic kidney disease, or unspecified chronic kidney disease: Secondary | ICD-10-CM | POA: Insufficient documentation

## 2017-11-21 DIAGNOSIS — I5042 Chronic combined systolic (congestive) and diastolic (congestive) heart failure: Secondary | ICD-10-CM | POA: Insufficient documentation

## 2017-11-21 DIAGNOSIS — Z79899 Other long term (current) drug therapy: Secondary | ICD-10-CM | POA: Insufficient documentation

## 2017-11-21 LAB — BASIC METABOLIC PANEL
Anion gap: 11 (ref 5–15)
BUN: 18 mg/dL (ref 6–20)
CHLORIDE: 102 mmol/L (ref 101–111)
CO2: 27 mmol/L (ref 22–32)
Calcium: 8.9 mg/dL (ref 8.9–10.3)
Creatinine, Ser: 1.83 mg/dL — ABNORMAL HIGH (ref 0.61–1.24)
GFR, EST AFRICAN AMERICAN: 47 mL/min — AB (ref >60)
GFR, EST NON AFRICAN AMERICAN: 41 mL/min — AB (ref >60)
Glucose, Bld: 113 mg/dL — ABNORMAL HIGH (ref 65–99)
POTASSIUM: 3.4 mmol/L — AB (ref 3.5–5.1)
SODIUM: 140 mmol/L (ref 135–145)

## 2017-11-21 LAB — CBC
HCT: 44.5 % (ref 39.0–52.0)
Hemoglobin: 14 g/dL (ref 13.0–17.0)
MCH: 28.3 pg (ref 26.0–34.0)
MCHC: 31.5 g/dL (ref 30.0–36.0)
MCV: 89.9 fL (ref 78.0–100.0)
PLATELETS: 244 10*3/uL (ref 150–400)
RBC: 4.95 MIL/uL (ref 4.22–5.81)
RDW: 13.9 % (ref 11.5–15.5)
WBC: 10.6 10*3/uL — AB (ref 4.0–10.5)

## 2017-11-21 NOTE — ED Triage Notes (Signed)
Pt presents with onset of nausea followed by dizziness while getting on bus today at 11.  Pt reports falling forward towards driver, EMS was called but pt felt better.  Pt reports getting on another bus and became dizzy then came here.  Pt denies any dizziness at present, only weakness.  Pt reports tingling in both hands with L>R.

## 2017-11-22 ENCOUNTER — Other Ambulatory Visit: Payer: Self-pay | Admitting: Internal Medicine

## 2017-11-22 ENCOUNTER — Emergency Department (HOSPITAL_COMMUNITY): Payer: Medicaid Other

## 2017-11-22 ENCOUNTER — Encounter (HOSPITAL_COMMUNITY): Payer: Self-pay | Admitting: Emergency Medicine

## 2017-11-22 DIAGNOSIS — G8929 Other chronic pain: Secondary | ICD-10-CM

## 2017-11-22 DIAGNOSIS — F32A Depression, unspecified: Secondary | ICD-10-CM

## 2017-11-22 DIAGNOSIS — M545 Low back pain: Principal | ICD-10-CM

## 2017-11-22 DIAGNOSIS — M542 Cervicalgia: Secondary | ICD-10-CM

## 2017-11-22 DIAGNOSIS — F329 Major depressive disorder, single episode, unspecified: Secondary | ICD-10-CM

## 2017-11-22 LAB — URINALYSIS, ROUTINE W REFLEX MICROSCOPIC
GLUCOSE, UA: NEGATIVE mg/dL
HGB URINE DIPSTICK: NEGATIVE
Ketones, ur: 5 mg/dL — AB
NITRITE: NEGATIVE
Protein, ur: 100 mg/dL — AB
SPECIFIC GRAVITY, URINE: 1.021 (ref 1.005–1.030)
pH: 5 (ref 5.0–8.0)

## 2017-11-22 LAB — CBG MONITORING, ED: Glucose-Capillary: 88 mg/dL (ref 65–99)

## 2017-11-22 LAB — I-STAT TROPONIN, ED: Troponin i, poc: 0.03 ng/mL (ref 0.00–0.08)

## 2017-11-22 MED ORDER — SODIUM CHLORIDE 0.9 % IV BOLUS
500.0000 mL | Freq: Once | INTRAVENOUS | Status: AC
  Administered 2017-11-22: 500 mL via INTRAVENOUS

## 2017-11-22 MED ORDER — CEPHALEXIN 250 MG PO CAPS
500.0000 mg | ORAL_CAPSULE | Freq: Once | ORAL | Status: AC
  Administered 2017-11-22: 500 mg via ORAL
  Filled 2017-11-22: qty 2

## 2017-11-22 MED ORDER — CEPHALEXIN 500 MG PO CAPS: 500.0000 mg | ORAL_CAPSULE | Freq: Four times a day (QID) | ORAL | 0 refills | Status: DC

## 2017-11-22 MED FILL — BIDIL TABLET: 20-37.5 | 30 days supply | Qty: 180 | Fill #1

## 2017-11-22 MED FILL — METOPROLOL TARTRATE 50 MG T: 50 | 30 days supply | Qty: 60 | Fill #1

## 2017-11-22 MED FILL — AMLODIPINE BESYLATE 5 MG TA: 5 | 30 days supply | Qty: 45 | Fill #2

## 2017-11-22 MED FILL — TAMSULOSIN HCL 0.4 MG CAP: 0.4 | 30 days supply | Qty: 30 | Fill #2

## 2017-11-22 MED FILL — CEPHALEXIN 500 MG CAPSULE: 500 | 7 days supply | Qty: 28 | Fill #0

## 2017-11-22 MED FILL — CETIRIZINE HCL 10 MG TABS: 10 | 30 days supply | Qty: 30 | Fill #2

## 2017-11-22 MED FILL — GABAPENTIN 300 MG CAPSULE: 300 | 30 days supply | Qty: 90 | Fill #1

## 2017-11-22 MED FILL — ALLOPURINOL 300 MG TABLET: 300 | 30 days supply | Qty: 30 | Fill #0

## 2017-11-22 MED FILL — CYCLOBENZAPRINE 10 MG TAB: 10 | 20 days supply | Qty: 60 | Fill #0

## 2017-11-22 MED FILL — SERTRALINE HCL 50 MG TABS: 50 | 30 days supply | Qty: 30 | Fill #0

## 2017-11-22 NOTE — ED Notes (Signed)
Patient transported to CT 

## 2017-11-22 NOTE — ED Notes (Signed)
Pt given a cup of ice water ok per RN [place]. Pt unable to give a urine sample at this time.

## 2017-11-22 NOTE — ED Notes (Signed)
Pt attempted to use urinal without success.

## 2017-11-22 NOTE — ED Notes (Signed)
Pt took his own bp medication per Dr. Nicanor Alcon

## 2017-11-22 NOTE — ED Notes (Addendum)
attempted IV start x 2.   Patient reminded need of urine specimen.

## 2017-11-22 NOTE — ED Notes (Signed)
Checked [facility] 88, RN Katie informed

## 2017-11-22 NOTE — ED Provider Notes (Addendum)
[facility] EMERGENCY DEPARTMENT Provider Note   CSN: [id number] Arrival date & time: 11/21/17  1440     History   Chief Complaint No chief complaint on file.   HPI Rickey Calderon is a 52 y.o. male.  The history is provided by the patient.  Dizziness  Quality:  Lightheadedness Severity:  Severe Onset quality:  Sudden Duration:  16 hours Timing:  Constant Progression:  Resolved Chronicity:  New Context: standing up   Relieved by:  Nothing Worsened by:  Nothing Ineffective treatments:  None tried Associated symptoms: nausea   Associated symptoms: no blood in stool, no chest pain, no diarrhea, no headaches, no hearing loss, no palpitations, no shortness of breath, no syncope, no tinnitus, no vomiting and no weakness   Lightheaded standing at the bus stop.    Past Medical History:  Diagnosis Date  . Asthma   . Bronchitis   . CHF (congestive heart failure) (HCC)    EF 30% by echo,  EF 50% Nov 2017  . Depression   . Femoral thrombosis (HCC)   . Gout   . Hypertension   . Morbid obesity (HCC)   . Prostatitis   . Seasonal allergies   . Sleep apnea    BiPAP    Patient Active Problem List   Diagnosis Date Noted  . Disturbance in sleep behavior 08/01/2017  . Hypertensive cardiomyopathy, without heart failure (HCC) 05/17/2016  . 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 OSA (obstructive sleep apnea) 04/22/2016  . Exertional dyspnea 04/22/2016  . Obesity hypoventilation syndrome (HCC) 04/22/2016  . CKD (chronic kidney disease) stage 3, GFR 30-59 ml/min (HCC) 02/02/2016  . Chronic combined systolic and diastolic heart failure, NYHA class 2 (HCC) 09/29/2015  . Chronic neck pain 09/21/2015  . Asthma   . Depression 07/10/2015  . Financial difficulties 04/30/2015  . Morbid obesity (HCC) 03/31/2015  . Chronic low back pain 03/31/2015  . OSA on CPAP 07/25/2014  . Essential hypertension   . Gout   . Seasonal allergies   .  Chronic prostatitis   . Hip pain 01/11/2013  . Otitis media of left ear 01/11/2013  . Plantar wart of left foot 01/11/2013    Past Surgical History:  Procedure Laterality Date  . ABDOMINAL AORTAGRAM N/A 07/28/2014   Procedure: ABDOMINAL Ronny Flurry;  Surgeon: Chuck Hint, MD;  Location: [facility] CATH LAB;  Service: Cardiovascular;  Laterality: N/A;  . LOWER EXTREMITY ANGIOGRAM Bilateral 07/28/2014   Procedure: LOWER EXTREMITY ANGIOGRAM;  Surgeon: Chuck Hint, MD;  Location: [facility] CATH LAB;  Service: Cardiovascular;  Laterality: Bilateral;  . THROMBECTOMY FEMORAL ARTERY Right 08/01/2014   Procedure: THROMBECTOMY RIGHT LEG SFA, Anterior tibial, and perineal arteries with intraoperative arteriograms;  Surgeon: Nada Libman, MD;  Location: [facility] OR;  Service: Vascular;  Laterality: Right;        Home Medications    Prior to Admission medications   Medication Sig Start Date End Date Taking? Authorizing Provider  acetaminophen-codeine (TYLENOL #3) 300-30 MG tablet TAKE 1 TABLET BY MOUTH EVERY 8 HOURS AS NEEDED FOR MODERATE PAIN 08/01/17   Marcine Matar, MD  albuterol (PROVENTIL HFA;VENTOLIN HFA) 108 (90 Base) MCG/ACT inhaler Inhale 2 puffs into the lungs every 6 (six) hours as needed for wheezing or shortness of breath. 01/05/17   Funches, Gerilyn Nestle, MD  allopurinol (ZYLOPRIM) 300 MG tablet Take 1 tablet (300 mg total) by mouth daily. 08/01/17   Marcine Matar, MD  amLODipine (NORVASC) 5  MG tablet Take 1.5 tablets (7.5 mg total) by mouth daily. 08/01/17   Marcine Matar, MD  buPROPion (WELLBUTRIN SR) 150 MG 12 hr tablet Take 1 tablet (150 mg total) by mouth 2 (two) times daily. 07/21/17   Marcine Matar, MD  cetirizine (ZYRTEC) 10 MG tablet Take 1 tablet (10 mg total) by mouth daily. 09/22/17   Marcine Matar, MD  cyclobenzaprine (FLEXERIL) 10 MG tablet TAKE 1 TABLET BY MOUTH 3 TIMES DAILY AS NEEDED FOR MUSCLE SPASMS. 10/20/17   Marcine Matar, MD  Diclofenac Sodium 3 % GEL  Place 1-2 g onto the skin 4 (four) times daily as needed (neck pain). 12/12/16   Funches, Gerilyn Nestle, MD  fluticasone (FLONASE) 50 MCG/ACT nasal spray Place 1 spray into both nostrils daily. 02/28/17   Marcine Matar, MD  Fluticasone-Salmeterol (ADVAIR) 100-50 MCG/DOSE AEPB Inhale 1 puff into the lungs 2 (two) times daily. 08/01/17   Marcine Matar, MD  furosemide (LASIX) 40 MG tablet Take 1 tablet (40 mg total) by mouth daily. 08/01/17   Marcine Matar, MD  gabapentin (NEURONTIN) 300 MG capsule Take 1 capsule (300 mg total) by mouth 3 (three) times daily. 08/01/17   Marcine Matar, MD  isosorbide-hydrALAZINE (BIDIL) 20-37.5 MG tablet Take 2 tablets by mouth 3 (three) times daily. 08/01/17   Marcine Matar, MD  metoprolol tartrate (LOPRESSOR) 50 MG tablet Take 1 tablet (50 mg total) by mouth 2 (two) times daily. 08/01/17   Marcine Matar, MD  Potassium Chloride ER 20 MEQ TBCR Take 1 tablet by mouth daily. 12/12/16   Funches, Gerilyn Nestle, MD  sertraline (ZOLOFT) 50 MG tablet TAKE 1 TABLET (50 MG TOTAL) BY MOUTH DAILY. 10/19/17   Marcine Matar, MD  tamsulosin (FLOMAX) 0.4 MG CAPS capsule TAKE 1 CAPSULE BY MOUTH DAILY AFTER SUPPER. 08/01/17   Marcine Matar, MD    Family History Family History  Problem Relation Age of Onset  . Diabetes Father   . Hyperlipidemia Father     Social History Social History   Tobacco Use  . Smoking status: Never Smoker  . Smokeless tobacco: Never Used  Substance Use Topics  . Alcohol use: No  . Drug use: No     Allergies   Patient has no known allergies.   Review of Systems Review of Systems  Constitutional: Negative for diaphoresis, fatigue and fever.  HENT: Negative for facial swelling, hearing loss, sinus pain, sore throat, tinnitus and voice change.   Eyes: Negative for photophobia.  Respiratory: Negative for cough, chest tightness, shortness of breath and wheezing.   Cardiovascular: Negative for chest pain, palpitations, leg swelling and  syncope.  Gastrointestinal: Positive for nausea. Negative for abdominal pain, blood in stool, diarrhea and vomiting.  Genitourinary: Negative for dysuria, flank pain and frequency.  Musculoskeletal: Negative for back pain, joint swelling, neck pain and neck stiffness.  Neurological: Positive for dizziness and light-headedness. Negative for tremors, seizures, syncope, facial asymmetry, speech difficulty, weakness, numbness and headaches.  All other systems reviewed and are negative.    Physical Exam Updated Vital Signs BP (!) 149/101 (BP Location: Left Arm)   Pulse (!) 58   Temp 97.6 F (36.4 C) (Oral)   Resp 18   Ht  (1.702 m)   Wt (!) 149.7 kg (330 lb)   SpO2 100%   BMI 51.69 kg/m   Physical Exam  Constitutional: He is oriented to person, place, and time. He appears well-developed and well-nourished.  HENT:  Head: Normocephalic and atraumatic.  Nose: Nose normal.  Mouth/Throat: No oropharyngeal exudate.  Eyes: Pupils are equal, round, and reactive to light. Conjunctivae and EOM are normal.  Neck: Normal range of motion. Neck supple.  Cardiovascular: Normal rate, regular rhythm, normal heart sounds and intact distal pulses.  Pulmonary/Chest: Effort normal and breath sounds normal. No stridor. He has no wheezes. He has no rales.  Abdominal: Soft. Bowel sounds are normal. He exhibits no mass. There is no tenderness. There is no rebound and no guarding.  Musculoskeletal: Normal range of motion. He exhibits no edema or tenderness.  Neurological: He is alert and oriented to person, place, and time. He displays normal reflexes. No cranial nerve deficit.  Skin: Skin is warm and dry. Capillary refill takes less than 2 seconds.  Nursing note and vitals reviewed.    ED Treatments / Results  Labs (all labs ordered are listed, but only abnormal results are displayed) Results for orders placed or performed during the hospital encounter of 11/21/17  Basic metabolic panel  Result  Value Ref Range   Sodium 140 135 - 145 mmol/L   Potassium 3.4 (L) 3.5 - 5.1 mmol/L   Chloride 102 101 - 111 mmol/L   CO2 27 22 - 32 mmol/L   Glucose, Bld 113 (H) 65 - 99 mg/dL   BUN 18 6 - 20 mg/dL   Creatinine, Ser 8.29 (H) 0.61 - 1.24 mg/dL   Calcium 8.9 8.9 - 56.2 mg/dL   GFR calc non Af Amer 41 (L) >60 mL/min   GFR calc Af Amer 47 (L) >60 mL/min   Anion gap 11 5 - 15  CBC  Result Value Ref Range   WBC 10.6 (H) 4.0 - 10.5 K/uL   RBC 4.95 4.22 - 5.81 MIL/uL   Hemoglobin 14.0 13.0 - 17.0 g/dL   HCT 13.0 86.5 - 78.4 %   MCV 89.9 78.0 - 100.0 fL   MCH 28.3 26.0 - 34.0 pg   MCHC 31.5 30.0 - 36.0 g/dL   RDW 69.6 29.5 - 28.4 %   Platelets 244 150 - 400 K/uL  CBG monitoring, ED  Result Value Ref Range   Glucose-Capillary 88 65 - 99 mg/dL   Comment 1 Notify RN   I-stat troponin, ED  Result Value Ref Range   Troponin i, poc 0.03 0.00 - 0.08 ng/mL   Comment 3           Dg Chest 2 View  Result Date: 11/22/2017 CLINICAL DATA:  Acute onset of dizziness. EXAM: CHEST - 2 VIEW COMPARISON:  Chest radiograph performed 08/31/2015 FINDINGS: The lungs are well-aerated. Minimal left basilar atelectasis is noted. There is no evidence of pleural effusion or pneumothorax. The heart is normal in size; the mediastinal contour is within normal limits. No acute osseous abnormalities are seen. IMPRESSION: Minimal left basilar atelectasis noted; lungs otherwise clear. Electronically Signed   By: Roanna Raider M.D.   On: 11/22/2017 04:34   Ct Head Wo Contrast  Result Date: 11/22/2017 CLINICAL DATA:  Acute onset of nausea, dizziness and generalized weakness. Bilateral hand tingling. EXAM: CT HEAD WITHOUT CONTRAST TECHNIQUE: Contiguous axial images were obtained from the base of the skull through the vertex without intravenous contrast. COMPARISON:  MRI of the brain performed 07/11/2017 FINDINGS: Brain: No evidence of acute infarction, hemorrhage, hydrocephalus, extra-axial collection or mass lesion/mass  effect. The posterior fossa, including the cerebellum, brainstem and fourth ventricle, is within normal limits. The third and lateral ventricles, and basal ganglia are  unremarkable in appearance. The cerebral hemispheres are symmetric in appearance, with normal gray-white differentiation. No mass effect or midline shift is seen. Vascular: No hyperdense vessel or unexpected calcification. Skull: There is no evidence of fracture; visualized osseous structures are unremarkable in appearance. Sinuses/Orbits: The orbits are within normal limits. The paranasal sinuses and mastoid air cells are well-aerated. Other: No additional soft tissue abnormalities are seen. IMPRESSION: Unremarkable noncontrast CT of the head. Electronically Signed   By: Roanna Raider M.D.   On: 11/22/2017 05:27    EKG EKG Interpretation  Date/Time:  Tuesday Nov 21 2017 16:22:27 EDT Ventricular Rate:  71 PR Interval:  210 QRS Duration: 96 QT Interval:  436 QTC Calculation: 473 R Axis:   125 Text Interpretation:  Sinus rhythm with 1st degree A-V block Right axis deviation Confirmed by Nicanor Alcon, Kainen Struckman ([id number]) on 11/22/2017 3:04:26 AM   Radiology No results found.  Procedures Procedures (including critical care time)  Medications Ordered in ED Medications  sodium chloride 0.9 % bolus 500 mL (has no administration in time range)     Patient had no CP or SOB but troponin was drawn at the 14 hour mark post symptoms making it sufficient for rule out.  No signs on exam or imaging of stroke.  No signs of infection.  Increase hydration.    Final Clinical Impressions(s) / ED Diagnoses   Return for weakness, numbness, changes in vision or speech, fevers >100.4 unrelieved by medication, shortness of breath, intractable vomiting, or diarrhea, abdominal pain, Inability to tolerate liquids or food, cough, altered mental status or any concerns. No signs of systemic illness or infection. The patient is nontoxic-appearing on exam and  vital signs are within normal limits.   I have reviewed the triage vital signs and the nursing notes. Pertinent labs &imaging results that were available during my care of the patient were reviewed by me and considered in my medical decision making (see chart for details).  After history, exam, and medical workup I feel the patient has been appropriately medically screened and is safe for discharge home. Pertinent diagnoses were discussed with the patient. Patient was given return precautions.     Mechel Schutter, MD 11/22/17 6045    Cy Blamer, MD 11/22/17 [id number]

## 2017-12-07 NOTE — Progress Notes (Deleted)
Cardiology Office Note   Date:  12/07/2017   ID:  Rickey MazeLennard Broughton, DOB 1965/09/13, MRN [id number]  PCP:  Marcine MatarJohnson, Deborah B, MD  Cardiologist:   Rollene RotundaJames Aliviyah Malanga, MD   No chief complaint on file.     History of Present Illness: Rickey Calderon is a 52 y.o. male who presents for evaluation of cardiomyopathy. He was found to have an ejection fraction of 25-30%.  I saw him in March 2017.  Follow up echo in Nov demonstrated an EF of 50% 2017.  He returns for follow up.  ***    Since I last saw him he was in the ED with leg pain.  I reviewed these records for this visit.  He had no evidence of DVT.    Since I last saw him he has done well.  The patient denies any new symptoms such as chest discomfort, neck or arm discomfort. There has been no new shortness of breath, PND or orthopnea. There have been no reported palpitations, presyncope or syncope.   He has a new BiPAP machine.    Past Medical History:  Diagnosis Date  . Asthma   . Bronchitis   . CHF (congestive heart failure) (HCC)    EF 30% by echo,  EF 50% Nov 2017  . Depression   . Femoral thrombosis (HCC)   . Gout   . Hypertension   . Morbid obesity (HCC)   . Prostatitis   . Seasonal allergies   . Sleep apnea    BiPAP    Past Surgical History:  Procedure Laterality Date  . ABDOMINAL AORTAGRAM N/A 07/28/2014   Procedure: ABDOMINAL Ronny FlurryAORTAGRAM;  Surgeon: Chuck Hinthristopher S Dickson, MD;  [facility];  Service: Cardiovascular;  Laterality: N/A;  . LOWER EXTREMITY ANGIOGRAM Bilateral 07/28/2014   Procedure: LOWER EXTREMITY ANGIOGRAM;  Surgeon: Chuck Hinthristopher S Dickson, MD;  [facility];  Service: Cardiovascular;  Laterality: Bilateral;  . THROMBECTOMY FEMORAL ARTERY Right 08/01/2014   Procedure: THROMBECTOMY RIGHT LEG SFA, Anterior tibial, and perineal arteries with intraoperative arteriograms;  Surgeon: Nada LibmanVance W Brabham, MD;  [facility];  Service: Vascular;  Laterality: Right;     Current Outpatient Medications    Medication Sig Dispense Refill  . acetaminophen-codeine (TYLENOL #3) 300-30 MG tablet TAKE 1 TABLET BY MOUTH EVERY 8 HOURS AS NEEDED FOR MODERATE PAIN 90 tablet 2  . albuterol (PROVENTIL HFA;VENTOLIN HFA) 108 (90 Base) MCG/ACT inhaler Inhale 2 puffs into the lungs every 6 (six) hours as needed for wheezing or shortness of breath. 1 Inhaler 5  . allopurinol (ZYLOPRIM) 300 MG tablet Take 1 tablet (300 mg total) by mouth daily. 30 tablet 5  . amLODipine (NORVASC) 5 MG tablet Take 1.5 tablets (7.5 mg total) by mouth daily. 45 tablet 5  . buPROPion (WELLBUTRIN SR) 150 MG 12 hr tablet Take 1 tablet (150 mg total) by mouth 2 (two) times daily. 60 tablet 0  . cephALEXin (KEFLEX) 500 MG capsule Take 1 capsule (500 mg total) by mouth 4 (four) times daily. 28 capsule 0  . cetirizine (ZYRTEC) 10 MG tablet Take 1 tablet (10 mg total) by mouth daily. 30 tablet 3  . cyclobenzaprine (FLEXERIL) 10 MG tablet TAKE 1 TABLET BY MOUTH 3 TIMES DAILY AS NEEDED FOR MUSCLE SPASMS. 60 tablet 0  . Diclofenac Sodium 3 % GEL Place 1-2 g onto the skin 4 (four) times daily as needed (neck pain). 100 g 2  . fluticasone (FLONASE) 50 MCG/ACT nasal spray Place 1 spray  into both nostrils daily. 16 g 6  . Fluticasone-Salmeterol (ADVAIR) 100-50 MCG/DOSE AEPB Inhale 1 puff into the lungs 2 (two) times daily. 60 each 5  . furosemide (LASIX) 40 MG tablet Take 1 tablet (40 mg total) by mouth daily. 30 tablet 5  . gabapentin (NEURONTIN) 300 MG capsule Take 1 capsule (300 mg total) by mouth 3 (three) times daily. 90 capsule 5  . isosorbide-hydrALAZINE (BIDIL) 20-37.5 MG tablet Take 2 tablets by mouth 3 (three) times daily. 180 tablet 5  . metoprolol tartrate (LOPRESSOR) 50 MG tablet Take 1 tablet (50 mg total) by mouth 2 (two) times daily. 60 tablet 6  . Potassium Chloride ER 20 MEQ TBCR Take 1 tablet by mouth daily. 30 tablet 5  . sertraline (ZOLOFT) 50 MG tablet TAKE 1 TABLET (50 MG TOTAL) BY MOUTH DAILY. 30 tablet 0  . tamsulosin  (FLOMAX) 0.4 MG CAPS capsule TAKE 1 CAPSULE BY MOUTH DAILY AFTER SUPPER. 30 capsule 6   No current facility-administered medications for this visit.     Allergies:   Patient has no known allergies.    ROS:  Please see the history of present illness.   Otherwise, review of systems are positive for none.   All other systems are reviewed and negative.    PHYSICAL EXAM: VS:  There were no vitals taken for this visit. , BMI There is no height or weight on file to calculate BMI.  GENERAL:  Well appearing NECK:  No jugular venous distention, waveform within normal limits, carotid upstroke brisk and symmetric, no bruits, no thyromegaly LUNGS:  Clear to auscultation bilaterally CHEST:  Unremarkable HEART:  PMI not displaced or sustained,S1 and S2 within normal limits, no S3, no S4, no clicks, no rubs, *** murmurs ABD:  Flat, positive bowel sounds normal in frequency in pitch, no bruits, no rebound, no guarding, no midline pulsatile mass, no hepatomegaly, no splenomegaly EXT:  2 plus pulses throughout, no edema, no cyanosis no clubbing    GENERAL:  Well appearing NECK:  No jugular venous distention, waveform within normal limits, carotid upstroke brisk and symmetric, no bruits, no thyromegaly LYMPHATICS:  No cervical, inguinal adenopathy LUNGS:  Clear to auscultation bilaterally BACK:  No CVA tenderness CHEST:  Unremarkable HEART:  PMI not displaced or sustained,S1 and S2 within normal limits, no S3, no S4, no clicks, no rubs, no murmurs ABD:  Flat, positive bowel sounds normal in frequency in pitch, no bruits, no rebound, no guarding, no midline pulsatile mass, no hepatomegaly, no splenomegaly, obese EXT:  2 plus pulses throughout, mild edema, no cyanosis no clubbing SKIN:  No rashes no nodules    EKG:  EKG is *** ordered today. Sinus rhythm, rate ***, axis within normal limits, intervals within normal limits, no acute ST-T wave changes.  Recent Labs: 12/19/2016: ALT 18; TSH  1.490 11/21/2017: BUN 18; Creatinine, Ser 1.83; Hemoglobin 14.0; Platelets 244; Potassium 3.4; Sodium 140    Lipid Panel    Component Value Date/Time   CHOL 169 03/31/2015 1148   TRIG 88 03/31/2015 1148   HDL 54 03/31/2015 1148   CHOLHDL 3.1 03/31/2015 1148   VLDL 18 03/31/2015 1148   LDLCALC 97 03/31/2015 1148      Wt Readings from Last 3 Encounters:  11/21/17 (!) 330 lb (149.7 kg)  08/01/17 (!) 333 lb (151 kg)  06/29/17 (!) 340 lb (154.2 kg)      Other studies Reviewed: Additional studies/ records that were reviewed today include:  *** Review of the  above records demonstrates:  ***  ASSESSMENT AND PLAN:  CARDIOMYOPATHY:   His EF improved   ***. I suspect this is secondary to hypertension.  He still needs an ischemia work up and will come back for a POET (Plain Old Exercise Treadmill)  HTN:   ***  This is being managed in the context of treating his CHF.    SLEEP APNEA:  He is on BiPAP.  ***   OBESITY:  *** We began to talk about diet and the need for weight loss as part of this therapy.  I would not want to prescribe any of the weight loss medications at this point.    ARTERIAL THROMBOSIS:  ***  He had no evidence of new thrombosis and a negative hypercoagulable workup. He's off his anticoagulant.    Current medicines are reviewed at length with the patient today.  The patient does not have concerns regarding medicines.  The following changes have been made:  ***  Labs/ tests ordered today include:  ***   No orders of the defined types were placed in this encounter.    Disposition:   FU with me in *** months.    Signed, Rollene Rotunda, MD  12/07/2017 10:06 PM    [facility]

## 2017-12-08 ENCOUNTER — Ambulatory Visit: Payer: Self-pay | Admitting: Cardiology

## 2017-12-11 ENCOUNTER — Encounter: Payer: Self-pay | Admitting: *Deleted

## 2017-12-13 ENCOUNTER — Ambulatory Visit: Payer: Medicaid Other | Admitting: Podiatry

## 2018-01-15 ENCOUNTER — Other Ambulatory Visit: Payer: Self-pay | Admitting: Internal Medicine

## 2018-01-15 DIAGNOSIS — F329 Major depressive disorder, single episode, unspecified: Secondary | ICD-10-CM

## 2018-01-15 DIAGNOSIS — M545 Low back pain: Secondary | ICD-10-CM

## 2018-01-15 DIAGNOSIS — M542 Cervicalgia: Secondary | ICD-10-CM

## 2018-01-15 DIAGNOSIS — F32A Depression, unspecified: Secondary | ICD-10-CM

## 2018-01-15 DIAGNOSIS — G8929 Other chronic pain: Secondary | ICD-10-CM

## 2018-01-15 MED FILL — CYCLOBENZAPRINE 10 MG TAB: 10 | 20 days supply | Qty: 60 | Fill #0

## 2018-01-15 MED FILL — GABAPENTIN 300 MG CAPSULE: 300 | 30 days supply | Qty: 90 | Fill #2

## 2018-01-15 MED FILL — METOPROLOL TARTRATE 50 MG T: 50 | 30 days supply | Qty: 60 | Fill #2

## 2018-01-15 MED FILL — SERTRALINE HCL 50 MG TABS: 50 | 30 days supply | Qty: 30 | Fill #0

## 2018-01-15 MED FILL — TAMSULOSIN HCL 0.4 MG CAP: 0.4 | 30 days supply | Qty: 30 | Fill #3

## 2018-01-15 MED FILL — AMLODIPINE BESYLATE 5 MG TA: 5 | 30 days supply | Qty: 45 | Fill #3

## 2018-01-15 MED FILL — BIDIL TABLET: 20-37.5 | 15 days supply | Qty: 90 | Fill #2

## 2018-01-15 MED FILL — ALLOPURINOL 300 MG TABLET: 300 | 30 days supply | Qty: 30 | Fill #1

## 2018-01-15 MED FILL — ACETAMINOPHEN/COD #3 TABLET: 300-30 | 30 days supply | Qty: 90 | Fill #2

## 2018-01-18 MED FILL — !BIDIL TABLET: 20-37.5MG | 15 days supply | Qty: 90 | Fill #3

## 2018-02-05 ENCOUNTER — Other Ambulatory Visit: Payer: Self-pay

## 2018-02-05 DIAGNOSIS — I10 Essential (primary) hypertension: Secondary | ICD-10-CM

## 2018-02-05 MED ORDER — ISOSORB DINITRATE-HYDRALAZINE 20-37.5 MG PO TABS: 2.0000 | ORAL_TABLET | Freq: Three times a day (TID) | ORAL | 3 refills | Status: DC

## 2018-02-13 ENCOUNTER — Telehealth: Payer: Self-pay | Admitting: Internal Medicine

## 2018-02-13 NOTE — Telephone Encounter (Signed)
Pt had a missed call from us please follow up if you placed a call to his number recently.

## 2018-02-19 ENCOUNTER — Other Ambulatory Visit: Payer: Self-pay | Admitting: Internal Medicine

## 2018-02-19 DIAGNOSIS — M545 Low back pain, unspecified: Secondary | ICD-10-CM

## 2018-02-19 DIAGNOSIS — M542 Cervicalgia: Secondary | ICD-10-CM

## 2018-02-19 DIAGNOSIS — G8929 Other chronic pain: Secondary | ICD-10-CM

## 2018-02-19 DIAGNOSIS — F32A Depression, unspecified: Secondary | ICD-10-CM

## 2018-02-19 DIAGNOSIS — F329 Major depressive disorder, single episode, unspecified: Secondary | ICD-10-CM

## 2018-02-19 MED FILL — $BIDIL TABLET: 20-37.5 | 90 days supply | Qty: 540 | Fill #0

## 2018-02-19 MED FILL — ALLOPURINOL 300 MG TAB: 300 | 30 days supply | Qty: 30 | Fill #2

## 2018-02-19 MED FILL — METOPROLOL TARTRATE 50 MG T: 50 | 30 days supply | Qty: 60 | Fill #3

## 2018-02-19 MED FILL — TAMSULOSIN HCL 0.4 MG CAP: 0.4 | 30 days supply | Qty: 30 | Fill #4

## 2018-02-19 MED FILL — GABAPENTIN 300 MG CAPSULE: 300 | 30 days supply | Qty: 90 | Fill #3

## 2018-02-19 MED FILL — AMLODIPINE BESYLATE 5 MG TA: 5 | 30 days supply | Qty: 45 | Fill #4

## 2018-02-20 ENCOUNTER — Other Ambulatory Visit: Payer: Self-pay

## 2018-02-20 DIAGNOSIS — G8929 Other chronic pain: Secondary | ICD-10-CM

## 2018-02-20 DIAGNOSIS — M542 Cervicalgia: Principal | ICD-10-CM

## 2018-02-20 MED ORDER — DICLOFENAC SODIUM 3 % TD GEL: 1.0000 g | Freq: Four times a day (QID) | TRANSDERMAL | 0 refills | Status: DC | PRN

## 2018-02-21 MED FILL — CYCLOBENZAPRINE 10 MG TAB: 10 | 20 days supply | Qty: 60 | Fill #0

## 2018-02-21 MED FILL — SERTRALINE HCL 50 MG TABS: 50 | 30 days supply | Qty: 30 | Fill #0

## 2018-02-23 MED FILL — FUROSEMIDE 40 MG TAB: 40 | 30 days supply | Qty: 30 | Fill #0

## 2018-02-27 ENCOUNTER — Other Ambulatory Visit: Payer: Self-pay

## 2018-02-27 DIAGNOSIS — I10 Essential (primary) hypertension: Secondary | ICD-10-CM

## 2018-02-27 MED ORDER — POTASSIUM CHLORIDE ER 20 MEQ PO TBCR: 1.0000 | EXTENDED_RELEASE_TABLET | Freq: Every day | ORAL | 0 refills | Status: DC

## 2018-03-10 IMAGING — DX DG LUMBAR SPINE COMPLETE 4+V
5 series · 5 of 5 positions shown · non-contrast
Comparison: CT scan of July 24, 2014.

CLINICAL DATA: Right-sided lumbar pain for 2 months without known
injury.

EXAM:
LUMBAR SPINE - COMPLETE 4+ VIEW

[l-spine ap]
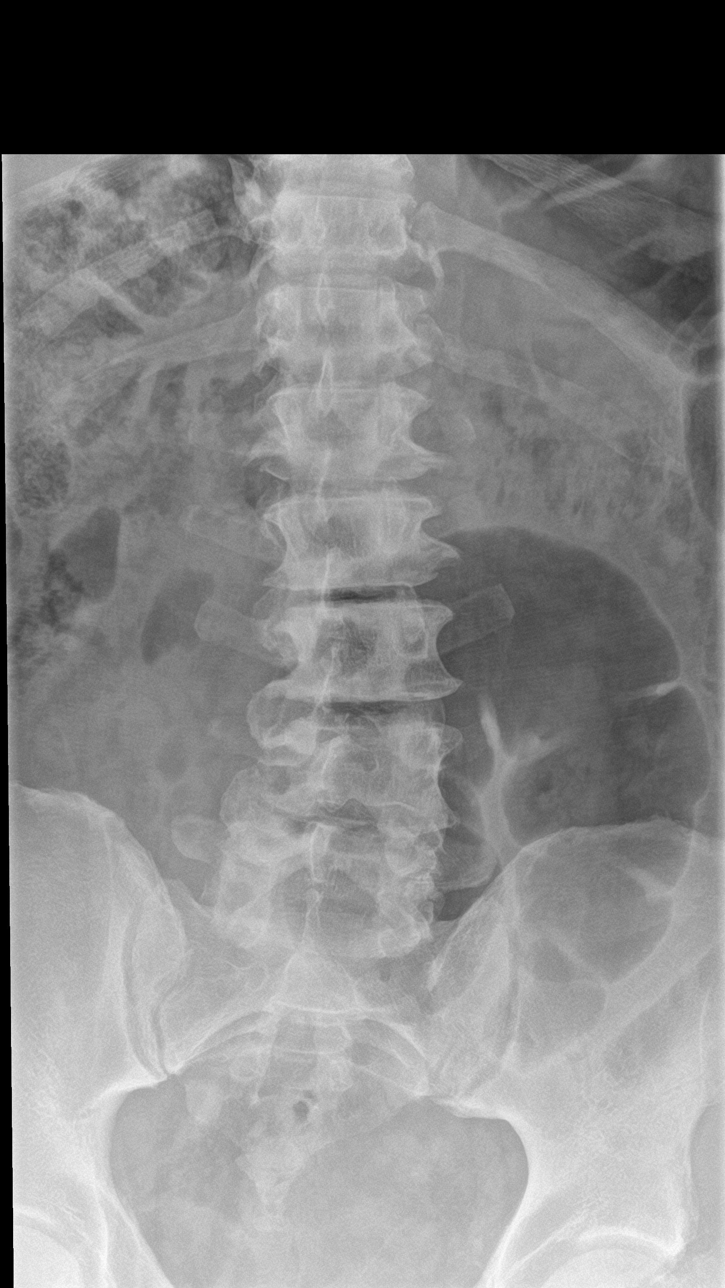

[l-spine obl (1 of 2)]
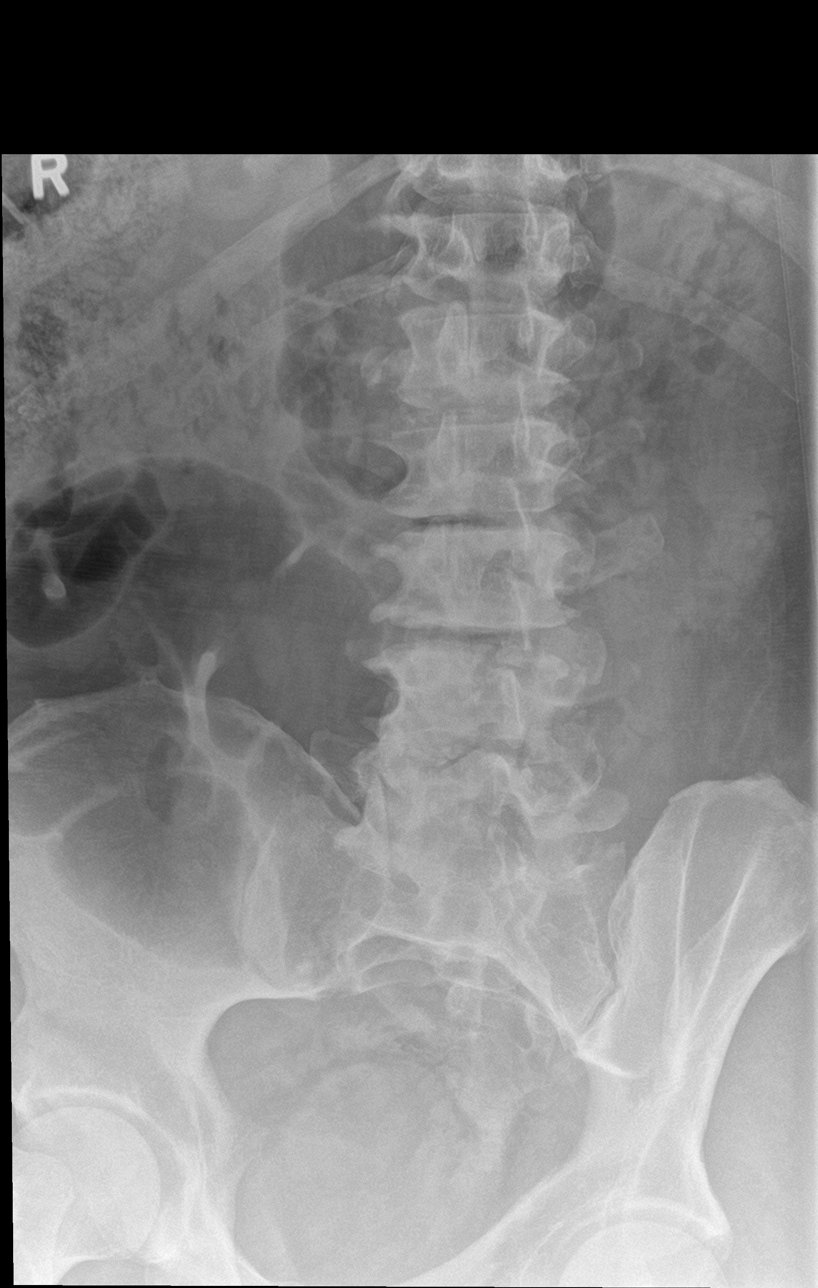

[l-spine obl (2 of 2)]
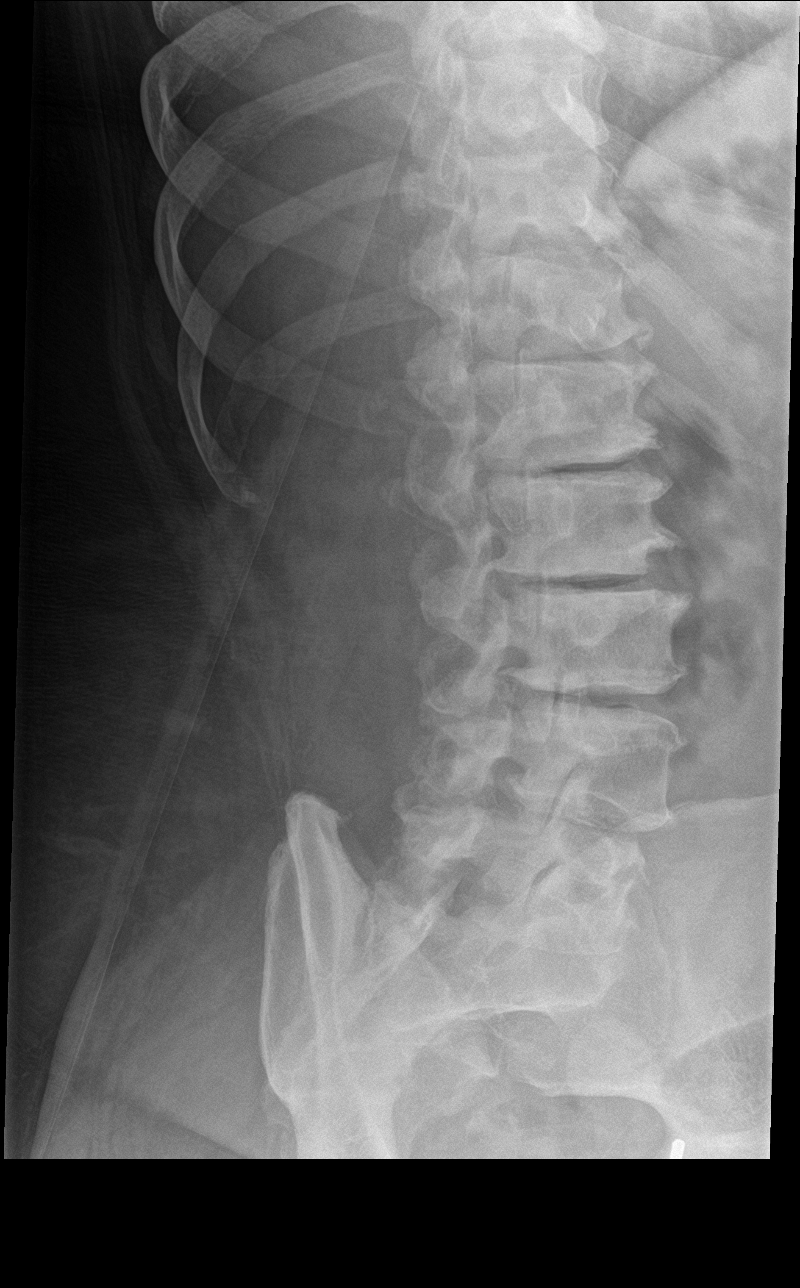

[l-spine lat]
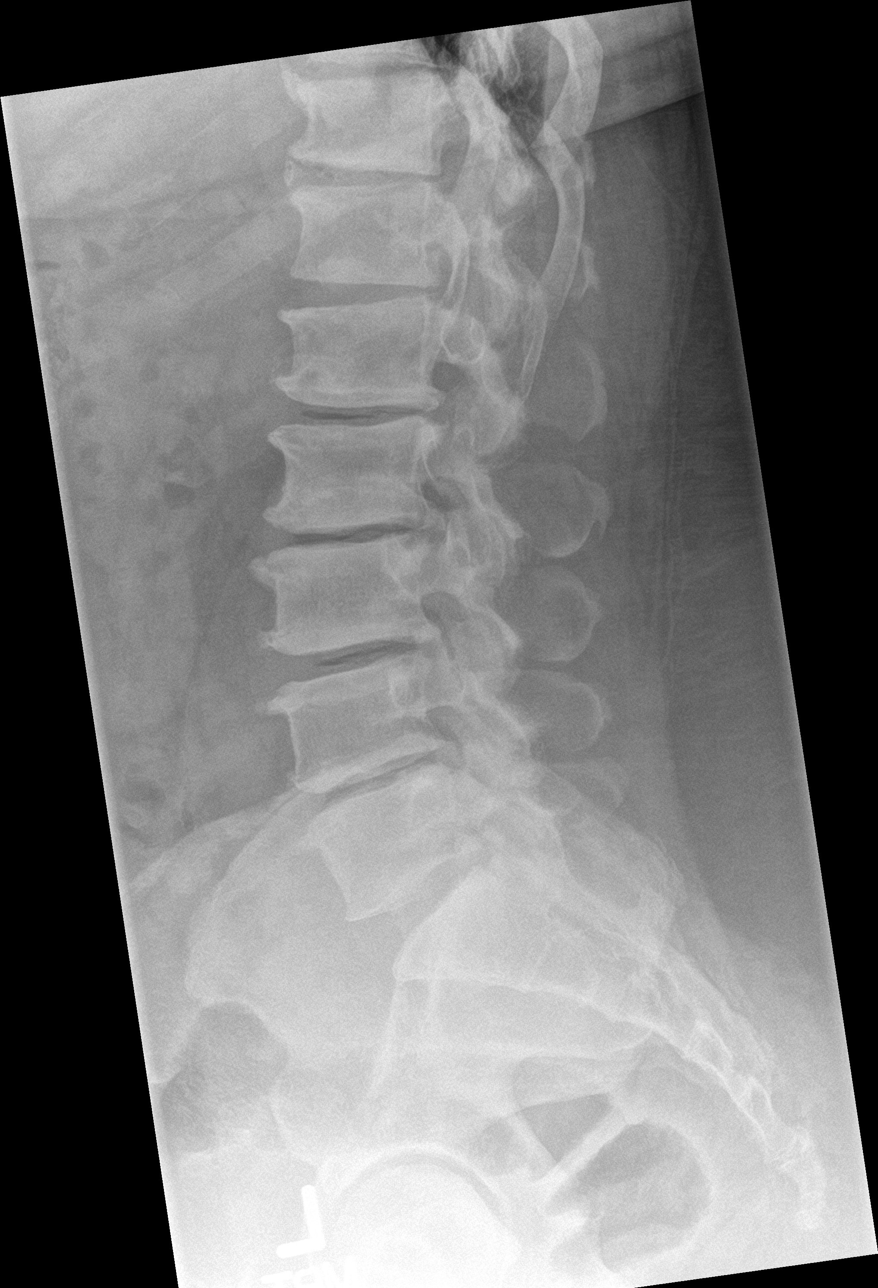

[l-spine spot]
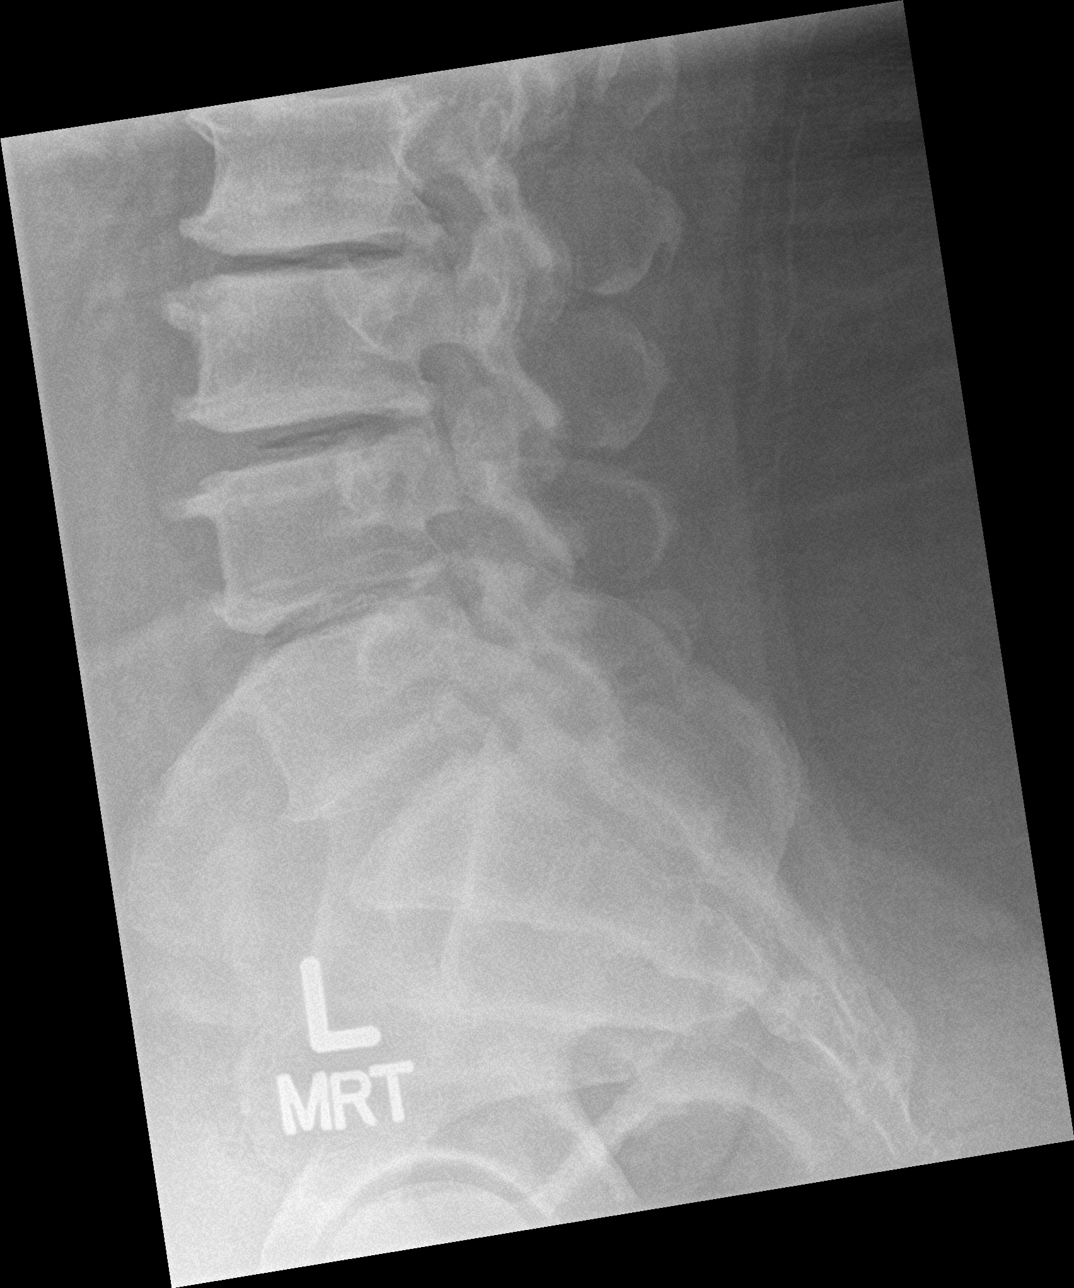

[5 of 5 positions shown; findings below may reference images not displayed]

FINDINGS: No fracture or spondylolisthesis is noted. Moderate degenerative
disc disease is noted at L1-2, L2-3, L3-4 and L4-5 with anterior
osteophyte formation and vacuum phenomenon. Degenerative changes
seen involving the right-sided posterior facet joints at L3-4 and
L4-5.
IMPRESSION: Multilevel degenerative disc disease as described above. No acute
abnormality seen in the lumbar spine.

## 2018-03-16 ENCOUNTER — Emergency Department (HOSPITAL_COMMUNITY)
Admission: EM | Admit: 2018-03-16 | Discharge: 2018-03-16 | Disposition: A | Discharge status: Home / Self Care | Payer: No Typology Code available for payment source | Attending: Emergency Medicine | Admitting: Emergency Medicine

## 2018-03-16 ENCOUNTER — Encounter (HOSPITAL_COMMUNITY): Payer: Self-pay | Admitting: *Deleted

## 2018-03-16 ENCOUNTER — Emergency Department (HOSPITAL_COMMUNITY): Payer: No Typology Code available for payment source

## 2018-03-16 DIAGNOSIS — Y9389 Activity, other specified: Secondary | ICD-10-CM | POA: Diagnosis not present

## 2018-03-16 DIAGNOSIS — J45909 Unspecified asthma, uncomplicated: Secondary | ICD-10-CM | POA: Insufficient documentation

## 2018-03-16 DIAGNOSIS — S6982XA Other specified injuries of left wrist, hand and finger(s), initial encounter: Secondary | ICD-10-CM | POA: Insufficient documentation

## 2018-03-16 DIAGNOSIS — Z79899 Other long term (current) drug therapy: Secondary | ICD-10-CM | POA: Insufficient documentation

## 2018-03-16 DIAGNOSIS — S60453A Superficial foreign body of left middle finger, initial encounter: Secondary | ICD-10-CM

## 2018-03-16 DIAGNOSIS — N183 Chronic kidney disease, stage 3 (moderate): Secondary | ICD-10-CM | POA: Diagnosis not present

## 2018-03-16 DIAGNOSIS — I130 Hypertensive heart and chronic kidney disease with heart failure and stage 1 through stage 4 chronic kidney disease, or unspecified chronic kidney disease: Secondary | ICD-10-CM | POA: Insufficient documentation

## 2018-03-16 DIAGNOSIS — W458XXA Other foreign body or object entering through skin, initial encounter: Secondary | ICD-10-CM | POA: Insufficient documentation

## 2018-03-16 DIAGNOSIS — I5042 Chronic combined systolic (congestive) and diastolic (congestive) heart failure: Secondary | ICD-10-CM | POA: Insufficient documentation

## 2018-03-16 DIAGNOSIS — S60451A Superficial foreign body of left index finger, initial encounter: Secondary | ICD-10-CM

## 2018-03-16 DIAGNOSIS — Y9289 Other specified places as the place of occurrence of the external cause: Secondary | ICD-10-CM | POA: Insufficient documentation

## 2018-03-16 DIAGNOSIS — Y990 Civilian activity done for income or pay: Secondary | ICD-10-CM | POA: Insufficient documentation

## 2018-03-16 MED ORDER — CEPHALEXIN 500 MG PO CAPS: 500.0000 mg | ORAL_CAPSULE | Freq: Four times a day (QID) | ORAL | 0 refills | Status: AC

## 2018-03-16 MED ORDER — LIDOCAINE HCL (PF) 1 % IJ SOLN
10.0000 mL | Freq: Once | INTRAMUSCULAR | Status: AC
  Administered 2018-03-16: 10 mL
  Filled 2018-03-16: qty 10

## 2018-03-16 NOTE — ED Triage Notes (Signed)
Pt in after stapling his left pointer and left middle finger together while at work, pieces of glove around fingers, pt reports tetanus is less than 5 years

## 2018-03-16 NOTE — ED Provider Notes (Signed)
[facility] Provider Note   CSN: [id number] Arrival date & time: 03/16/18  [id number]     History   Chief Complaint Chief Complaint  Patient presents with  . Hand Injury    HPI Rickey MazeLennard Calderon is a 52 y.o. male resenting for evaluation of staple in the finger.  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  He reports acute onset of pain.  He reports mild discomfort at this time, but no sharp pain.  He denies numbness or tingling.  He is not on blood thinners.  He is not immune compromised.  He has not taken anything for pain including Tylenol or ibuprofen.  Tetanus is up-to-date.  Denies significant bleeding.   HPI  Past Medical History:  Diagnosis Date  . Asthma   . Bronchitis   . CHF (congestive heart failure) (HCC)    EF 30% by echo,  EF 50% Nov 2017  . Depression   . Femoral thrombosis (HCC)   . Gout   . Hypertension   . Morbid obesity (HCC)   . Prostatitis   . Seasonal allergies   . Sleep apnea    BiPAP    Patient Active Problem List   Diagnosis Date Noted  . Disturbance in sleep behavior 08/01/2017  . Hypertensive cardiomyopathy, without heart failure (HCC) 05/17/2016  . 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 OSA (obstructive sleep apnea) 04/22/2016  . Exertional dyspnea 04/22/2016  . Obesity hypoventilation syndrome (HCC) 04/22/2016  . CKD (chronic kidney disease) stage 3, GFR 30-59 ml/min (HCC) 02/02/2016  . Chronic combined systolic and diastolic heart failure, NYHA class 2 (HCC) 09/29/2015  . Chronic neck pain 09/21/2015  . Asthma   . Depression 07/10/2015  . Financial difficulties 04/30/2015  . Morbid obesity (HCC) 03/31/2015  . Chronic low back pain 03/31/2015  . OSA on CPAP 07/25/2014  . Essential hypertension   . Gout   . Seasonal allergies   .  Chronic prostatitis   . Hip pain 01/11/2013  . Otitis media of left ear 01/11/2013  . Plantar wart of left foot 01/11/2013    Past Surgical History:  Procedure Laterality Date  . ABDOMINAL AORTAGRAM N/A 07/28/2014   Procedure: ABDOMINAL Ronny FlurryAORTAGRAM;  Surgeon: Chuck Hinthristopher S Dickson, MD;  [facility];  Service: Cardiovascular;  Laterality: N/A;  . LOWER EXTREMITY ANGIOGRAM Bilateral 07/28/2014   Procedure: LOWER EXTREMITY ANGIOGRAM;  Surgeon: Chuck Hinthristopher S Dickson, MD;  [facility];  Service: Cardiovascular;  Laterality: Bilateral;  . THROMBECTOMY FEMORAL ARTERY Right 08/01/2014   Procedure: THROMBECTOMY RIGHT LEG SFA, Anterior tibial, and perineal arteries with intraoperative arteriograms;  Surgeon: Nada LibmanVance W Brabham, MD;  [facility];  Service: Vascular;  Laterality: Right;        Home Medications    Prior to Admission medications   Medication Sig Start Date End Date Taking? Authorizing Provider  acetaminophen-codeine (TYLENOL #3) 300-30 MG tablet TAKE 1 TABLET BY MOUTH EVERY 8 HOURS AS NEEDED FOR MODERATE PAIN 08/01/17   Marcine MatarJohnson, Deborah B, MD  albuterol (PROVENTIL HFA;VENTOLIN HFA) 108 (90 Base) MCG/ACT inhaler Inhale 2 puffs into the lungs every 6 (six) hours as needed for wheezing or shortness of breath. 01/05/17   Funches, Gerilyn NestleJosalyn, MD  allopurinol (ZYLOPRIM) 300 MG tablet Take 1 tablet (300 mg total) by  mouth daily. 08/01/17   Marcine Matar, MD  amLODipine (NORVASC) 5 MG tablet Take 1.5 tablets (7.5 mg total) by mouth daily. 08/01/17   Marcine Matar, MD  buPROPion (WELLBUTRIN SR) 150 MG 12 hr tablet Take 1 tablet (150 mg total) by mouth 2 (two) times daily. 07/21/17   Marcine Matar, MD  cephALEXin (KEFLEX) 500 MG capsule Take 1 capsule (500 mg total) by mouth 4 (four) times daily for 7 days. 03/16/18 03/23/18  Trygg Mantz, PA-C  cetirizine (ZYRTEC) 10 MG tablet Take 1 tablet (10 mg total) by mouth daily. 09/22/17   Marcine Matar, MD  cyclobenzaprine  (FLEXERIL) 10 MG tablet TAKE 1 TABLET BY MOUTH 3 TIMES DAILY AS NEEDED FOR MUSCLE SPASMS. 02/21/18   Marcine Matar, MD  Diclofenac Sodium 3 % GEL Place 1-2 g onto the skin 4 (four) times daily as needed (neck pain). 02/20/18   Marcine Matar, MD  fluticasone (FLONASE) 50 MCG/ACT nasal spray Place 1 spray into both nostrils daily. 02/28/17   Marcine Matar, MD  Fluticasone-Salmeterol (ADVAIR) 100-50 MCG/DOSE AEPB Inhale 1 puff into the lungs 2 (two) times daily. 08/01/17   Marcine Matar, MD  furosemide (LASIX) 40 MG tablet Take 1 tablet (40 mg total) by mouth daily. 08/01/17   Marcine Matar, MD  gabapentin (NEURONTIN) 300 MG capsule Take 1 capsule (300 mg total) by mouth 3 (three) times daily. 08/01/17   Marcine Matar, MD  isosorbide-hydrALAZINE (BIDIL) 20-37.5 MG tablet Take 2 tablets by mouth 3 (three) times daily. 02/05/18   Marcine Matar, MD  metoprolol tartrate (LOPRESSOR) 50 MG tablet Take 1 tablet (50 mg total) by mouth 2 (two) times daily. 08/01/17   Marcine Matar, MD  Potassium Chloride ER 20 MEQ TBCR Take 1 tablet by mouth daily. MUST MAKE APPT FOR FURTHER REFILLS 02/27/18   Marcine Matar, MD  sertraline (ZOLOFT) 50 MG tablet TAKE 1 TABLET BY MOUTH DAILY. 02/21/18   Marcine Matar, MD  tamsulosin (FLOMAX) 0.4 MG CAPS capsule TAKE 1 CAPSULE BY MOUTH DAILY AFTER SUPPER. 08/01/17   Marcine Matar, MD    Family History Family History  Problem Relation Age of Onset  . Diabetes Father   . Hyperlipidemia Father     Social History Social History   Tobacco Use  . Smoking status: Never Smoker  . Smokeless tobacco: Never Used  Substance Use Topics  . Alcohol use: No  . Drug use: No     Allergies   Patient has no known allergies.   Review of Systems Review of Systems  Skin: Positive for wound.  Allergic/Immunologic: Negative for immunocompromised state.  Neurological: Negative for numbness.  Hematological: Does not bruise/bleed easily.      Physical Exam Updated Vital Signs BP (!) 164/114 (BP Location: Right Arm)   Pulse 61   Temp 98.3 F (36.8 C) (Oral)   Resp 18   SpO2 100%   Physical Exam  Constitutional: He is oriented to person, place, and time. He appears well-developed and well-nourished. No distress.  HENT:  Head: Normocephalic and atraumatic.  Eyes: EOM are normal.  Neck: Normal range of motion.  Pulmonary/Chest: Effort normal.  Abdominal: He exhibits no distension.  Musculoskeletal: Normal range of motion.  First and second left finger stapled together on the dorsal aspect.  Good cap refill of distal fingers.  Sensation intact.  Good radial pulses.  No significant bleeding.  No injury noted elsewhere. See picture below (  glove material between fingers)  Neurological: He is alert and oriented to person, place, and time. No sensory deficit.  Skin: Skin is warm. Capillary refill takes less than 2 seconds. No rash noted.  Psychiatric: He has a normal mood and affect.  Nursing note and vitals reviewed.      ED Treatments / Results  Labs (all labs ordered are listed, but only abnormal results are displayed) Labs Reviewed - No data to display  EKG None  Radiology Dg Hand Complete Left  Result Date: 03/16/2018 CLINICAL DATA:  Stapled left index and middle finger together. EXAM: LEFT HAND - COMPLETE 3+ VIEW COMPARISON:  None. FINDINGS: Large staple spanning the volar soft tissues of the distal index and middle fingers. No osseous involvement. No acute fracture or dislocation. Joint spaces are preserved. Widening of the scapholunate interval. Bone mineralization is normal. IMPRESSION: 1. Staple spanning the volar soft tissues of the distal index and middle fingers. No bony involvement. No acute osseous abnormality. 2. 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 Electronically Signed   By: Obie Dredge M.D.   On: 03/16/2018 10:25     Procedures .Foreign Body Removal Date/Time: 03/16/2018 11:40 AM Performed by: Alveria Apley, PA-C Authorized by: Alveria Apley, PA-C  Consent: Verbal consent obtained. Consent given by: patient Anesthesia: digital block  Anesthesia: Local Anesthetic: lidocaine 1% without epinephrine Anesthetic total: 5 mL  Sedation: Patient sedated: no  Patient restrained: no Complexity: simple 1 objects recovered. Objects recovered: staple Post-procedure assessment: foreign body removed Patient tolerance: Patient tolerated the procedure well with no immediate complications   (including critical care time)  Medications Ordered in ED Medications  lidocaine (PF) (XYLOCAINE) 1 % injection 10 mL (10 mLs Other Given 03/16/18 1038)     Initial Impression / Assessment and Plan / ED Course  I have reviewed the triage vital signs and the nursing notes.  Pertinent labs & imaging results that were available during my care of the patient were reviewed by me and considered in my medical decision making (see chart for details).     Patient presenting for evaluation after he accidentally stapled his first and second finger together at work.  Physical exam reassuring, he is neurovascularly intact.  Tetanus is up-to-date.  He is not immunocompromised.  Fingers are able to be separated.  Digital block performed as described above, staple easily removed with pliers.  Wound irrigated, cleaned, and dressed.  Discussed wound care.  Will place on antibiotics prophylactically due to puncture wound.  X-ray viewed abd interpreted by me, no bony involvement.  Patient to follow-up with PCP as needed for further wound care or concerns.  At this time, patient appears safe for discharge.  Return precautions given.  Patient states he understands and agrees plan.   Final Clinical Impressions(s) / ED Diagnoses   Final diagnoses:  Foreign body of left index finger  Foreign body of left middle finger    ED  Discharge Orders         Ordered    cephALEXin (KEFLEX) 500 MG capsule  4 times daily     03/16/18 [address] Naiyana Barbian, PA-C 03/16/18 1142    Azalia Bilis, MD 03/16/18 1735

## 2018-03-16 NOTE — Discharge Instructions (Signed)
Take antibiotics as prescribed.  Take the entire course, even if your symptoms improve. Soak your hand in soapy water for 20 minutes at a time 3 times a day for the next week. Keep your hand covered and clean while not soaking. Use Tylenol or ibuprofen as needed for pain.  Use ice to help with pain and swelling. Follow-up with your primary care doctor as needed for wound recheck. Return to the emergency room if you develop high fevers, severe worsening pain, pus draining from the area, or any new or concerning symptoms.

## 2018-03-16 NOTE — ED Notes (Signed)
Patient transported to X-ray 

## 2018-03-16 NOTE — ED Notes (Signed)
ED Provider at bedside. 

## 2018-03-20 ENCOUNTER — Other Ambulatory Visit: Payer: Self-pay | Admitting: Internal Medicine

## 2018-03-20 DIAGNOSIS — M542 Cervicalgia: Principal | ICD-10-CM

## 2018-03-20 DIAGNOSIS — G8929 Other chronic pain: Secondary | ICD-10-CM

## 2018-03-20 MED ORDER — DICLOFENAC SODIUM 1 % TD GEL: 2.0000 g | Freq: Four times a day (QID) | TRANSDERMAL | 2 refills | Status: DC

## 2018-03-20 MED FILL — DICLOFENAC SODIUM 1% GEL: 1 | 12 days supply | Qty: 100 | Fill #0

## 2018-03-23 MED FILL — GABAPENTIN 300 MG CAPSULE: 300 | 30 days supply | Qty: 90 | Fill #4

## 2018-03-23 MED FILL — AMLODIPINE BESYLATE 5 MG TA: 5 | 30 days supply | Qty: 45 | Fill #5

## 2018-03-23 MED FILL — SERTRALINE HCL 50 MG TABS: 50 | 30 days supply | Qty: 30 | Fill #1

## 2018-03-23 MED FILL — METOPROLOL TARTRATE 50 MG T: 50 | 30 days supply | Qty: 60 | Fill #4

## 2018-04-19 ENCOUNTER — Other Ambulatory Visit: Payer: Self-pay | Admitting: Internal Medicine

## 2018-04-19 DIAGNOSIS — I10 Essential (primary) hypertension: Secondary | ICD-10-CM

## 2018-04-19 MED FILL — TAMSULOSIN HCL 0.4 MG CAP: 0.4 | 30 days supply | Qty: 30 | Fill #5

## 2018-04-19 MED FILL — METOPROLOL TARTRATE 50 MG T: 50 | 30 days supply | Qty: 60 | Fill #5

## 2018-04-19 MED FILL — CYCLOBENZAPRINE 10 MG TAB: 10 | 20 days supply | Qty: 60 | Fill #1

## 2018-04-19 MED FILL — ALLOPURINOL 300 MG TAB: 300 | 30 days supply | Qty: 30 | Fill #3

## 2018-04-19 MED FILL — GABAPENTIN 300 MG CAPSULE: 300 | 30 days supply | Qty: 90 | Fill #5

## 2018-04-19 MED FILL — SERTRALINE HCL 50 MG TABS: 50 | 30 days supply | Qty: 30 | Fill #2

## 2018-04-19 MED FILL — AMLODIPINE BESYLATE 5 MG TA: 5 | 30 days supply | Qty: 45 | Fill #0

## 2018-05-21 ENCOUNTER — Other Ambulatory Visit: Payer: Self-pay | Admitting: Internal Medicine

## 2018-05-21 DIAGNOSIS — I10 Essential (primary) hypertension: Secondary | ICD-10-CM

## 2018-05-21 DIAGNOSIS — F32A Depression, unspecified: Secondary | ICD-10-CM

## 2018-05-21 DIAGNOSIS — M545 Low back pain: Principal | ICD-10-CM

## 2018-05-21 DIAGNOSIS — F329 Major depressive disorder, single episode, unspecified: Secondary | ICD-10-CM

## 2018-05-21 DIAGNOSIS — G8929 Other chronic pain: Secondary | ICD-10-CM

## 2018-05-21 MED FILL — METOPROLOL TARTRATE 50 MG T: 50 | 30 days supply | Qty: 60 | Fill #6

## 2018-05-21 MED FILL — TAMSULOSIN HCL 0.4 MG CAP: 0.4 | 30 days supply | Qty: 30 | Fill #6

## 2018-05-21 MED FILL — $BIDIL TABLET: 20-37.5 | 90 days supply | Qty: 540 | Fill #1

## 2018-05-21 MED FILL — ALLOPURINOL 300 MG TAB: 300 | 30 days supply | Qty: 30 | Fill #4

## 2018-05-22 MED FILL — GABAPENTIN 300 MG CAPSULE: 300 | 30 days supply | Qty: 90 | Fill #0

## 2018-05-22 MED FILL — AMLODIPINE BESYLATE 5 MG TA: 5 | 30 days supply | Qty: 45 | Fill #0

## 2018-05-22 MED FILL — SERTRALINE HCL 50 MG TABS: 50 | 30 days supply | Qty: 30 | Fill #0

## 2018-06-18 ENCOUNTER — Other Ambulatory Visit: Payer: Self-pay | Admitting: Internal Medicine

## 2018-06-18 DIAGNOSIS — G8929 Other chronic pain: Secondary | ICD-10-CM

## 2018-06-18 DIAGNOSIS — M545 Low back pain, unspecified: Secondary | ICD-10-CM

## 2018-06-18 DIAGNOSIS — I10 Essential (primary) hypertension: Secondary | ICD-10-CM

## 2018-06-18 DIAGNOSIS — M542 Cervicalgia: Secondary | ICD-10-CM

## 2018-06-18 DIAGNOSIS — R059 Cough, unspecified: Secondary | ICD-10-CM

## 2018-06-18 DIAGNOSIS — R05 Cough: Secondary | ICD-10-CM

## 2018-06-18 DIAGNOSIS — F329 Major depressive disorder, single episode, unspecified: Secondary | ICD-10-CM

## 2018-06-18 DIAGNOSIS — F32A Depression, unspecified: Secondary | ICD-10-CM

## 2018-06-18 MED FILL — ALLOPURINOL 300 MG TAB: 300 | 30 days supply | Qty: 30 | Fill #5

## 2018-06-18 NOTE — Telephone Encounter (Signed)
1) Medication(s) Requested (by name): -metoprolol tartrate (LOPRESSOR) 50 MG tablet  -fluticasone (FLONASE) 50 MCG/ACT nasal spray  -gabapentin (NEURONTIN) 300 MG capsule  -cyclobenzaprine (FLEXERIL) 10 MG tablet  -sertraline (ZOLOFT) 50 MG tablet   2) Pharmacy of Choice: -[facility] 3) Special Requests:Until her next appt   Approved medications will be sent to the pharmacy, we will reach out if there is an issue.  Requests made after 3pm may not be addressed until the following business day!  If a patient is unsure of the name of the medication(s) please note and ask patient to call back when they are able to provide all info, do not send to responsible party until all information is available!

## 2018-06-21 MED ORDER — FLUTICASONE PROPIONATE 50 MCG/ACT NA SUSP: 1.0000 | Freq: Every day | NASAL | 0 refills | Status: DC

## 2018-06-21 MED ORDER — METOPROLOL TARTRATE 50 MG PO TABS: 50.0000 mg | ORAL_TABLET | Freq: Two times a day (BID) | ORAL | 0 refills | Status: DC

## 2018-06-21 MED ORDER — GABAPENTIN 300 MG PO CAPS: 300.0000 mg | ORAL_CAPSULE | Freq: Three times a day (TID) | ORAL | 0 refills | Status: DC

## 2018-06-21 MED ORDER — CYCLOBENZAPRINE HCL 10 MG PO TABS: 10.0000 mg | ORAL_TABLET | Freq: Three times a day (TID) | ORAL | 0 refills | Status: DC | PRN

## 2018-06-21 MED FILL — METOPROLOL TARTRATE 50 MG T: 50 | 30 days supply | Qty: 60 | Fill #0

## 2018-06-21 MED FILL — GABAPENTIN 300 MG CAPSULE: 300 | 30 days supply | Qty: 90 | Fill #0

## 2018-06-21 MED FILL — CYCLOBENZAPRINE 10 MG TAB: 10 | 20 days supply | Qty: 60 | Fill #0

## 2018-06-21 MED FILL — FLUTICASONE PROP 50 MCG SPR: 50 | 30 days supply | Qty: 16 | Fill #0

## 2018-06-26 ENCOUNTER — Ambulatory Visit: Payer: Self-pay | Attending: Family Medicine | Admitting: Physician Assistant

## 2018-06-26 VITALS — BP 160/110 | HR 63 | Temp 97.6°F | Ht 67.0 in | Wt 306.0 lb

## 2018-06-26 DIAGNOSIS — M545 Low back pain: Secondary | ICD-10-CM | POA: Insufficient documentation

## 2018-06-26 DIAGNOSIS — Z79899 Other long term (current) drug therapy: Secondary | ICD-10-CM | POA: Insufficient documentation

## 2018-06-26 DIAGNOSIS — I10 Essential (primary) hypertension: Secondary | ICD-10-CM

## 2018-06-26 DIAGNOSIS — G8929 Other chronic pain: Secondary | ICD-10-CM

## 2018-06-26 DIAGNOSIS — Z6841 Body Mass Index (BMI) 40.0 and over, adult: Secondary | ICD-10-CM

## 2018-06-26 DIAGNOSIS — M109 Gout, unspecified: Secondary | ICD-10-CM | POA: Insufficient documentation

## 2018-06-26 DIAGNOSIS — J4530 Mild persistent asthma, uncomplicated: Secondary | ICD-10-CM | POA: Insufficient documentation

## 2018-06-26 DIAGNOSIS — I509 Heart failure, unspecified: Secondary | ICD-10-CM | POA: Insufficient documentation

## 2018-06-26 DIAGNOSIS — R059 Cough, unspecified: Secondary | ICD-10-CM

## 2018-06-26 DIAGNOSIS — J4520 Mild intermittent asthma, uncomplicated: Secondary | ICD-10-CM

## 2018-06-26 DIAGNOSIS — F32A Depression, unspecified: Secondary | ICD-10-CM

## 2018-06-26 DIAGNOSIS — E669 Obesity, unspecified: Secondary | ICD-10-CM

## 2018-06-26 DIAGNOSIS — F329 Major depressive disorder, single episode, unspecified: Secondary | ICD-10-CM | POA: Insufficient documentation

## 2018-06-26 DIAGNOSIS — I110 Hypertensive heart disease with heart failure: Secondary | ICD-10-CM | POA: Insufficient documentation

## 2018-06-26 DIAGNOSIS — Z7951 Long term (current) use of inhaled steroids: Secondary | ICD-10-CM | POA: Insufficient documentation

## 2018-06-26 DIAGNOSIS — Z13220 Encounter for screening for lipoid disorders: Secondary | ICD-10-CM

## 2018-06-26 DIAGNOSIS — R05 Cough: Secondary | ICD-10-CM

## 2018-06-26 MED ORDER — SERTRALINE HCL 50 MG PO TABS: 50.0000 mg | ORAL_TABLET | Freq: Every day | ORAL | 1 refills | Status: DC

## 2018-06-26 MED ORDER — ISOSORB DINITRATE-HYDRALAZINE 20-37.5 MG PO TABS: 2.0000 | ORAL_TABLET | Freq: Three times a day (TID) | ORAL | 2 refills | Status: DC

## 2018-06-26 MED ORDER — ALLOPURINOL 300 MG PO TABS: 300.0000 mg | ORAL_TABLET | Freq: Every day | ORAL | 5 refills | Status: DC

## 2018-06-26 MED ORDER — FLUTICASONE-SALMETEROL 100-50 MCG/DOSE IN AEPB: 1.0000 | INHALATION_SPRAY | Freq: Two times a day (BID) | RESPIRATORY_TRACT | 5 refills | Status: DC

## 2018-06-26 MED ORDER — AMLODIPINE BESYLATE 5 MG PO TABS: 7.5000 mg | ORAL_TABLET | Freq: Every day | ORAL | 5 refills | Status: DC

## 2018-06-26 MED ORDER — TAMSULOSIN HCL 0.4 MG PO CAPS: ORAL_CAPSULE | ORAL | 6 refills | Status: AC

## 2018-06-26 MED ORDER — FUROSEMIDE 40 MG PO TABS: 40.0000 mg | ORAL_TABLET | Freq: Every day | ORAL | 5 refills | Status: DC

## 2018-06-26 MED ORDER — GABAPENTIN 300 MG PO CAPS: 300.0000 mg | ORAL_CAPSULE | Freq: Three times a day (TID) | ORAL | 5 refills | Status: DC

## 2018-06-26 MED ORDER — ALBUTEROL SULFATE HFA 108 (90 BASE) MCG/ACT IN AERS: 2.0000 | INHALATION_SPRAY | Freq: Four times a day (QID) | RESPIRATORY_TRACT | 5 refills | Status: DC | PRN

## 2018-06-26 MED ORDER — POTASSIUM CHLORIDE ER 20 MEQ PO TBCR: 1.0000 | EXTENDED_RELEASE_TABLET | Freq: Every day | ORAL | 5 refills | Status: DC

## 2018-06-26 MED ORDER — METOPROLOL TARTRATE 50 MG PO TABS: 50.0000 mg | ORAL_TABLET | Freq: Two times a day (BID) | ORAL | 5 refills | Status: DC

## 2018-06-26 MED ORDER — FLUTICASONE PROPIONATE 50 MCG/ACT NA SUSP: 1.0000 | Freq: Every day | NASAL | 0 refills | Status: DC

## 2018-06-26 NOTE — Patient Instructions (Signed)
Check blood pressure out of the office 3-5 times/week and record and bring to your next visit.  Make follow-up appt if your numbers are consistently still >135/85 after you have resumed all of your medications.

## 2018-06-26 NOTE — Progress Notes (Signed)
Rickey Calderon, is a 52 y.o. male  NWG:[id number]  [id number]:[id number]  DOB - 1966-06-14  Subjective:  Chief Complaint and HPI: Rickey Calderon is a 52 y.o. male here today for BP med RF.  No problems/concerns/complaints.  Denies CP/HA/dizziness.   ROS:   Constitutional:  No f/c, No night sweats, No unexplained weight loss. EENT:  No vision changes, No blurry vision, No hearing changes. No mouth, throat, or ear problems.  Respiratory: No cough, No SOB Cardiac: No CP, no palpitations GI:  No abd pain, No N/V/D. GU: No Urinary s/sx Musculoskeletal: No joint pain Neuro: No headache, no dizziness, no motor weakness.  Skin: No rash Endocrine:  No polydipsia. No polyuria.  Psych: Denies SI/HI  No problems updated.  ALLERGIES: No Known Allergies  PAST MEDICAL HISTORY: Past Medical History:  Diagnosis Date  . Asthma   . Bronchitis   . CHF (congestive heart failure) (HCC)    EF 30% by echo,  EF 50% Nov 2017  . Depression   . Femoral thrombosis (HCC)   . Gout   . Hypertension   . Morbid obesity (HCC)   . Prostatitis   . Seasonal allergies   . Sleep apnea    BiPAP    MEDICATIONS AT HOME: Prior to Admission medications   Medication Sig Start Date End Date Taking? Authorizing Provider  acetaminophen-codeine (TYLENOL #3) 300-30 MG tablet TAKE 1 TABLET BY MOUTH EVERY 8 HOURS AS NEEDED FOR MODERATE PAIN 08/01/17  Yes Marcine MatarJohnson, Deborah B, MD  albuterol (PROVENTIL HFA;VENTOLIN HFA) 108 (90 Base) MCG/ACT inhaler Inhale 2 puffs into the lungs every 6 (six) hours as needed for wheezing or shortness of breath. 06/26/18  Yes Georgian CoMcClung, Marshe Shrestha M, PA-C  allopurinol (ZYLOPRIM) 300 MG tablet Take 1 tablet (300 mg total) by mouth daily. 06/26/18  Yes Georgian CoMcClung, Jaliel Deavers M, PA-C  amLODipine (NORVASC) 5 MG tablet Take 1.5 tablets (7.5 mg total) by mouth daily. 06/26/18  Yes Georgian CoMcClung, Kharisma Glasner M, PA-C  cetirizine (ZYRTEC) 10 MG tablet Take 1 tablet (10 mg total) by mouth daily. 09/22/17  Yes Marcine MatarJohnson, Deborah B,  MD  cyclobenzaprine (FLEXERIL) 10 MG tablet Take 1 tablet (10 mg total) by mouth 3 (three) times daily as needed for muscle spasms. 06/21/18  Yes Marcine MatarJohnson, Deborah B, MD  diclofenac sodium (VOLTAREN) 1 % GEL Apply 2 g topically 4 (four) times daily. 03/20/18  Yes Marcine MatarJohnson, Deborah B, MD  fluticasone (FLONASE) 50 MCG/ACT nasal spray Place 1 spray into both nostrils daily. 06/26/18  Yes Zandon Talton M, PA-C  Fluticasone-Salmeterol (ADVAIR) 100-50 MCG/DOSE AEPB Inhale 1 puff into the lungs 2 (two) times daily. 06/26/18  Yes Georgian CoMcClung, Anuj Summons M, PA-C  furosemide (LASIX) 40 MG tablet Take 1 tablet (40 mg total) by mouth daily. 06/26/18  Yes Georgian CoMcClung, Kesean Serviss M, PA-C  gabapentin (NEURONTIN) 300 MG capsule Take 1 capsule (300 mg total) by mouth 3 (three) times daily. 06/26/18  Yes Dayami Taitt M, PA-C  isosorbide-hydrALAZINE (BIDIL) 20-37.5 MG tablet Take 2 tablets by mouth 3 (three) times daily. 06/26/18  Yes Georgian CoMcClung, Mang Hazelrigg M, PA-C  metoprolol tartrate (LOPRESSOR) 50 MG tablet Take 1 tablet (50 mg total) by mouth 2 (two) times daily. 06/26/18  Yes Anders SimmondsMcClung, Glendy Barsanti M, PA-C  Potassium Chloride ER 20 MEQ TBCR Take 1 tablet by mouth daily. 06/26/18  Yes Georgian CoMcClung, Dionel Archey M, PA-C  sertraline (ZOLOFT) 50 MG tablet Take 1 tablet (50 mg total) by mouth daily. 06/26/18  Yes Anders SimmondsMcClung, Kinzley Savell M, PA-C  tamsulosin (FLOMAX) 0.4 MG CAPS capsule  TAKE 1 CAPSULE BY MOUTH DAILY AFTER SUPPER. 06/26/18  Yes Christiana Gurevich M, PA-C  buPROPion [facility] SR) 150 MG 12 hr tablet Take 1 tablet (150 mg total) by mouth 2 (two) times daily. Patient not taking: Reported on 06/26/2018 07/21/17   Marcine Matar, MD     Objective:  EXAM:   Vitals:   06/26/18 1050  BP: (!) 160/110  Pulse: 63  Temp: 97.6 F (36.4 C)  SpO2: 96%  Weight: (!) 306 lb (138.8 kg)  Height: 5\' 7"  (1.702 m)    General appearance : A&OX3. NAD. Non-toxic-appearing;  Morbidly obese HEENT: Atraumatic and Normocephalic.  PERRLA. EOM intact.  TM clear  B. Mouth-MMM, post pharynx WNL w/o erythema, No PND. Neck: supple, no JVD. No cervical lymphadenopathy. No thyromegaly Chest/Lungs:  Breathing-non-labored, Good air entry bilaterally, breath sounds normal without rales, rhonchi, or wheezing  CVS: S1 S2 regular, no murmurs, gallops, rubs  Extremities: Bilateral Lower Ext shows no edema, both legs are warm to touch with = pulse throughout Neurology:  CN II-XII grossly intact, Non focal.   Psych:  TP linear. J/I WNL. Normal speech. Appropriate eye contact and affect.  Skin:  No Rash  Data Review Lab Results  Component Value Date   HGBA1C 5.40 07/10/2015   HGBA1C 5.4 08/14/2014     Assessment & Plan   1. Essential hypertension Uncontrolled-out of meds-resume meds, DASH diet.  Check blood pressure out of the office 3-5 times/week and record and bring to your next visit.  Make follow-up appt if your numbers are consistently still >135/85 after you have resumed all of your medications.  - Comprehensive metabolic panel - CBC with Differential/Platelet - amLODipine (NORVASC) 5 MG tablet; Take 1.5 tablets (7.5 mg total) by mouth daily.  Dispense: 45 tablet; Refill: 5 - furosemide (LASIX) 40 MG tablet; Take 1 tablet (40 mg total) by mouth daily.  Dispense: 30 tablet; Refill: 5 - metoprolol tartrate (LOPRESSOR) 50 MG tablet; Take 1 tablet (50 mg total) by mouth 2 (two) times daily.  Dispense: 60 tablet; Refill: 5 - isosorbide-hydrALAZINE (BIDIL) 20-37.5 MG tablet; Take 2 tablets by mouth 3 (three) times daily.  Dispense: 504 tablet; Refill: 2 - Potassium Chloride ER 20 MEQ TBCR; Take 1 tablet by mouth daily.  Dispense: 30 tablet; Refill: 5  2. Mild intermittent asthma without complication - albuterol (PROVENTIL HFA;VENTOLIN HFA) 108 (90 Base) MCG/ACT inhaler; Inhale 2 puffs into the lungs every 6 (six) hours as needed for wheezing or shortness of breath.  Dispense: 1 Inhaler; Refill: 5  3. Cough in adult improved - fluticasone (FLONASE) 50  MCG/ACT nasal spray; Place 1 spray into both nostrils daily.  Dispense: 16 g; Refill: 0  4. Mild persistent asthma without complication - Fluticasone-Salmeterol (ADVAIR) 100-50 MCG/DOSE AEPB; Inhale 1 puff into the lungs 2 (two) times daily.  Dispense: 60 each; Refill: 5  5. Chronic right-sided low back pain without sciatica - gabapentin (NEURONTIN) 300 MG capsule; Take 1 capsule (300 mg total) by mouth 3 (three) times daily.  Dispense: 90 capsule; Refill: 5  6. Depression, unspecified depression type Stable, no changes - sertraline (ZOLOFT) 50 MG tablet; Take 1 tablet (50 mg total) by mouth daily.  Dispense: 90 tablet; Refill: 1  7. Screening cholesterol level - Lipid panel  Patient have been counseled extensively about nutrition and exercise  Return in about 3 months (around 09/25/2018) for Dr Coral Spikes and chronic conditions.  The patient was given clear instructions to go to ER or return to  medical center if symptoms don't improve, worsen or new problems develop. The patient verbalized understanding. The patient was told to call to get lab results if they haven't heard anything in the next week.     Georgian CoAngela Larry Alcock, PA-C [facility] [place], [place] [phone]   06/26/2018, 11:13 AM

## 2018-06-27 LAB — COMPREHENSIVE METABOLIC PANEL
ALT: 12 IU/L (ref 0–44)
AST: 13 IU/L (ref 0–40)
Albumin/Globulin Ratio: 1.3 (ref 1.2–2.2)
Albumin: 4 g/dL (ref 3.5–5.5)
Alkaline Phosphatase: 88 IU/L (ref 39–117)
BUN/Creatinine Ratio: 29 — ABNORMAL HIGH (ref 9–20)
BUN: 39 mg/dL — ABNORMAL HIGH (ref 6–24)
Bilirubin Total: 0.6 mg/dL (ref 0.0–1.2)
CO2: 22 mmol/L (ref 20–29)
Calcium: 9 mg/dL (ref 8.7–10.2)
Chloride: 101 mmol/L (ref 96–106)
Creatinine, Ser: 1.35 mg/dL — ABNORMAL HIGH (ref 0.76–1.27)
GFR calc Af Amer: 69 mL/min/{1.73_m2} (ref >59)
GFR calc non Af Amer: 60 mL/min/{1.73_m2} (ref >59)
Globulin, Total: 3.2 g/dL (ref 1.5–4.5)
Glucose: 77 mg/dL (ref 65–99)
Potassium: 4.6 mmol/L (ref 3.5–5.2)
Sodium: 148 mmol/L — ABNORMAL HIGH (ref 134–144)
Total Protein: 7.2 g/dL (ref 6.0–8.5)

## 2018-06-27 LAB — CBC WITH DIFFERENTIAL/PLATELET
Basophils Absolute: 0 10*3/uL (ref 0.0–0.2)
Basos: 0 %
EOS (ABSOLUTE): 0.1 10*3/uL (ref 0.0–0.4)
Eos: 1 %
HEMATOCRIT: 41.8 % (ref 37.5–51.0)
Hemoglobin: 13.6 g/dL (ref 13.0–17.7)
Immature Grans (Abs): 0.2 10*3/uL — ABNORMAL HIGH (ref 0.0–0.1)
Immature Granulocytes: 1 %
LYMPHS ABS: 2.6 10*3/uL (ref 0.7–3.1)
Lymphs: 22 %
MCH: 28.8 pg (ref 26.6–33.0)
MCHC: 32.5 g/dL (ref 31.5–35.7)
MCV: 89 fL (ref 79–97)
Monocytes Absolute: 1.2 10*3/uL — ABNORMAL HIGH (ref 0.1–0.9)
Monocytes: 10 %
Neutrophils Absolute: 7.9 10*3/uL — ABNORMAL HIGH (ref 1.4–7.0)
Neutrophils: 66 %
Platelets: 210 10*3/uL (ref 150–450)
RBC: 4.72 x10E6/uL (ref 4.14–5.80)
RDW: 12.9 % (ref 12.3–15.4)
WBC: 12 10*3/uL — ABNORMAL HIGH (ref 3.4–10.8)

## 2018-06-27 LAB — LIPID PANEL
CHOLESTEROL TOTAL: 209 mg/dL — AB (ref 100–199)
Chol/HDL Ratio: 2.5 ratio (ref 0.0–5.0)
HDL: 82 mg/dL (ref >39)
LDL Calculated: 108 mg/dL — ABNORMAL HIGH (ref 0–99)
TRIGLYCERIDES: 93 mg/dL (ref 0–149)
VLDL Cholesterol Cal: 19 mg/dL (ref 5–40)

## 2018-07-03 ENCOUNTER — Telehealth (INDEPENDENT_AMBULATORY_CARE_PROVIDER_SITE_OTHER): Payer: Self-pay

## 2018-07-03 NOTE — Telephone Encounter (Signed)
Left voicemail notifying patient that labs are overall good/improving. Be sure to drink ample water to help kidney function. Cholesterol is only a little elevated; reduce fat and cholesterol in diet. Follow up as planned. Call clinic with any questions or concerns at [phone]. Maryjean Morn, CMA

## 2018-07-03 NOTE — Telephone Encounter (Signed)
-----   Message from Anders Simmonds, [place] sent at 06/28/2018  8:44 AM EST ----- Your labs are overall good/imptoving.  Make sure you drink ample water to help kidney function.  Your cholesterol is only a little elevated;  Reduce fat and cholesterol in your diet.  Follow up as planned.  Thanks, Georgian Co, PA-C

## 2018-07-27 MED FILL — SERTRALINE HCL 50 MG TABS: 50 | 30 days supply | Qty: 30 | Fill #1

## 2018-07-27 MED FILL — AMLODIPINE BESYLATE 5 MG TA: 5 | 30 days supply | Qty: 45 | Fill #1

## 2018-07-30 ENCOUNTER — Other Ambulatory Visit: Payer: Self-pay | Admitting: Internal Medicine

## 2018-07-30 DIAGNOSIS — M545 Low back pain, unspecified: Secondary | ICD-10-CM

## 2018-07-30 DIAGNOSIS — G8929 Other chronic pain: Secondary | ICD-10-CM

## 2018-07-30 MED FILL — ALLOPURINOL 300 MG TAB: 300 | 30 days supply | Qty: 30 | Fill #0

## 2018-07-30 MED FILL — DICLOFENAC SODIUM 1% GEL: 1 | 12 days supply | Qty: 100 | Fill #1

## 2018-07-30 MED FILL — METOPROLOL TARTRATE 50 MG T: 50 | 30 days supply | Qty: 60 | Fill #0

## 2018-07-30 MED FILL — GABAPENTIN 300 MG CAPSULE: 300 | 30 days supply | Qty: 90 | Fill #0

## 2018-07-31 ENCOUNTER — Telehealth: Payer: Self-pay | Admitting: Adult Health

## 2018-07-31 NOTE — Telephone Encounter (Signed)
Why did the note not qualify?  Is it because the December visit was out of the 31-90 day window?  The message below is not very clear and the office note does state that patient was wearing, compliant and should continue therapy.  Per TP: is the above what they are talking about?  If they need another compliance report for now, pt will need another face-to-face visit and download.  If they need documentation for that specific time that patient was seen, we can do that stating that patient was compliant at time of visit.  06/07/2017 Follow up : OSA  Patient returns for a one year follow-up.  Patient has known severe sleep apnea.  He is on BiPAP at bedtime.   he says he is doing okay on BiPAP.  He feels that his mask is too small.  He previously had a large mask and was sent a medium mask.  He says he needs new supplies. 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  We discussed compliance and usage.  Download shows good compliance at 83%.  Average usage at 4.5 hours.  Patient is on BiPAP auto BiPAP 20, EPAP 5, pressure support 4 cm H2O.  AHI 3.7.  Possible leaks. Has been working on weight loss. Is down 30lbs. Congratulated on success.   OSA (obstructive sleep apnea) - Parrett, Virgel Bouquet, NP at 06/07/2017 11:53 AM   Status: Written  Related Problem: OSA (obstructive sleep apnea)    OSA/OHS - controlled on BIPAP  Encouraged on compliance and usage >4hr.  Cont w/ wt loss.    Plan  Patient Instructions  Continue on BIPAP At bedtime  .  Wear each night for at least 4hr .  Do not drive if sleepy .  Good job with weight loss, keep up good work.  Follow up in 6 months with Dr. Vassie Loll  Or Parrett NP

## 2018-07-31 NOTE — Telephone Encounter (Signed)
Kiger, Minerva Areola, Heather C, CMA        Good morning,  Patient was setup with Bipap 07/2016 and had a follow up office visit 05/2017 (notes did not qualify as second face to face visit discussing continued use and benefit).  Medicaid will not reauthorize this patients equipment as the order is still on hold for follow up notes showing pt is using and benefiting from device / compliance. Last physician seen was Rubye Oaks, NP.  Can you please help me out?  Thank you,  Dimas Millin    _______________________________________________________________________________________  TP please advise I have received this message above. Patient was last seen in 2018. Thank you

## 2018-07-31 NOTE — Telephone Encounter (Signed)
In regards to the community message received I have sent this back and will wait on a reply.    Could you please provide [place] with more information. Why was the OV note not qualify? Is it because it was out of the 31-90 day window? The OV note does state that the patient was wearing the CPAP, was compliant, and should continue wearing it. If you are needing another compliance note for now the patient will need to come in for another face to face OV and a download. If you are just needing documentation stating that the patient was complaint at the time of visit for that specific time we can do that as well. It may be easier to call us so that we can talk about the patient over the phone. Our number is [phone] you can ask for Corrine and I can help you if that would be better.   Corrine Cox

## 2018-07-31 NOTE — Telephone Encounter (Signed)
Pt last seen: 06/26/18 Next appt: n/a Last RX written on: 08/01/17 Date of original fill: 08/08/17 Date of refill(s): 09/07/17, 01/15/18  No other controlled substances were filled during this time, please refill if appropriate.

## 2018-08-01 ENCOUNTER — Other Ambulatory Visit: Payer: Self-pay | Admitting: Internal Medicine

## 2018-08-01 DIAGNOSIS — G8929 Other chronic pain: Secondary | ICD-10-CM

## 2018-08-01 DIAGNOSIS — M545 Low back pain: Principal | ICD-10-CM

## 2018-08-01 NOTE — Telephone Encounter (Signed)
Received response back from [place]. Copied below.   The notes do show that but they don't reference WHAT his compliance was... i.e. used 28/30 days at 87% with 4 or more hours.   AND - because it is Medicaid - they will only let us use notes within the last 6 months.   If he will come in... that will be great. If not, please let me know and Fayrene Fearing and I will have to figure out something as his account is on hold. :(   Thank you so much for your help! I do appreciate it!   ______________________________________________________________________________  TP please advise if we can fix notes to state what his compliance was, if not we can bring patient in for an appointment thank you.

## 2018-08-31 DIAGNOSIS — G4733 Obstructive sleep apnea (adult) (pediatric): Secondary | ICD-10-CM | POA: Diagnosis not present

## 2018-09-03 ENCOUNTER — Telehealth: Payer: Self-pay | Admitting: Internal Medicine

## 2018-09-03 MED FILL — METOPROLOL TARTRATE 50 MG T: 50 | 30 days supply | Qty: 60 | Fill #1

## 2018-09-03 MED FILL — AMLODIPINE BESYLATE 5 MG TA: 5 | 30 days supply | Qty: 45 | Fill #2

## 2018-09-03 MED FILL — GABAPENTIN 300 MG CAPSULE: 300 | 30 days supply | Qty: 90 | Fill #1

## 2018-09-03 MED FILL — SERTRALINE HCL 50 MG TABS: 50 | 30 days supply | Qty: 30 | Fill #2

## 2018-09-03 MED FILL — ALLOPURINOL 300 MG TAB: 300 | 30 days supply | Qty: 30 | Fill #1

## 2018-09-03 NOTE — Telephone Encounter (Signed)
New Message   \1) Medication(s) Requested (by name): Prednisone    2) Pharmacy of Choice: [facility]  3) Special Requests:   Approved medications will be sent to the pharmacy, we will reach out if there is an issue.  Requests made after 3pm may not be addressed until the following business day!  If a patient is unsure of the name of the medication(s) please note and ask patient to call back when they are able to provide all info, do not send to responsible party until all information is available!

## 2018-09-03 NOTE — Telephone Encounter (Signed)
If the patient needs prednisone he should make an appointment to be seen here or go to urgent care if need be.

## 2018-09-04 MED FILL — !BIDIL TABLET: 20-37.5MG | 30 days supply | Qty: 180 | Fill #2

## 2018-09-10 DIAGNOSIS — M25531 Pain in right wrist: Secondary | ICD-10-CM | POA: Diagnosis not present

## 2018-09-10 DIAGNOSIS — M5432 Sciatica, left side: Secondary | ICD-10-CM | POA: Diagnosis not present

## 2018-09-10 DIAGNOSIS — M1000 Idiopathic gout, unspecified site: Secondary | ICD-10-CM | POA: Diagnosis not present

## 2018-09-20 ENCOUNTER — Ambulatory Visit: Payer: Self-pay | Attending: Internal Medicine | Admitting: Physician Assistant

## 2018-09-20 ENCOUNTER — Other Ambulatory Visit: Payer: Self-pay

## 2018-09-20 DIAGNOSIS — M545 Low back pain, unspecified: Secondary | ICD-10-CM

## 2018-09-20 DIAGNOSIS — M25539 Pain in unspecified wrist: Secondary | ICD-10-CM

## 2018-09-20 DIAGNOSIS — G8929 Other chronic pain: Secondary | ICD-10-CM

## 2018-09-20 DIAGNOSIS — M25531 Pain in right wrist: Secondary | ICD-10-CM

## 2018-09-20 MED ORDER — CYCLOBENZAPRINE HCL 10 MG PO TABS: ORAL_TABLET | ORAL | 0 refills | Status: DC

## 2018-09-20 MED ORDER — NAPROXEN 500 MG PO TABS: 500.0000 mg | ORAL_TABLET | Freq: Two times a day (BID) | ORAL | 0 refills | Status: DC

## 2018-09-20 NOTE — Progress Notes (Signed)
Patient ID: Rickey Calderon, male   DOB: December 11, 1965, 53 y.o.   MRN: [id number]      Virtual Visit via Telephone Note  I connected with Rickey Calderon on 09/20/18 at  2:30 PM EDT by telephone and verified that I am speaking with the correct person using two identifiers.   I discussed the limitations, risks, security and privacy concerns of performing an evaluation and management service by telephone and the availability of in person appointments. I also discussed with the patient that there may be a patient responsible charge related to this service. The patient expressed understanding and agreed to proceed.   History of Present Illness: Went to urgent care on 09/10/2018 for R wrist pain and sciatica. Was given a 6 day prednisone taper. Still having some pain.  He was told he might need to come off allopurinol due to poor kidney function but based on the labs that were done that day Cr=1.34, BUN=26.  Flexeril has helped some in the past. No problems moving bowels or bladder.        Observations/Objective:  TP linear.  Mood is upbeat   Assessment and Plan: 1. Chronic low back pain-not new - naproxen (NAPROSYN) 500 MG tablet; Take 1 tablet (500 mg total) by mouth 2 (two) times daily with a meal.  Dispense: 60 tablet; Refill: 0 - cyclobenzaprine (FLEXERIL) 10 MG tablet; 1/2-1 tablet 3 times daily prn muscle spasms  Dispense: 60 tablet; Refill: 0  2. Wrist pain - naproxen (NAPROSYN) 500 MG tablet; Take 1 tablet (500 mg total) by mouth 2 (two) times daily with a meal.  Dispense: 60 tablet; Refill: 0 - cyclobenzaprine (FLEXERIL) 10 MG tablet; 1/2-1 tablet 3 times daily prn muscle spasms  Dispense: 60 tablet; Refill: 0    Follow Up Instructions: I discussed the assessment and treatment plan with the patient. The patient was provided an opportunity to ask questions and all were answered. The patient agreed with the plan and demonstrated an understanding of the instructions.   The patient was advised  to call back or seek an in-person evaluation if the symptoms worsen or if the condition fails to improve as anticipated.  I provided 17 minutes of non-face-to-face time during this encounter.   Georgian Co, PA-C

## 2018-09-27 MED FILL — ALLOPURINOL 300 MG TAB: 300 | 30 days supply | Qty: 30 | Fill #2

## 2018-09-27 MED FILL — BIDIL TABLET: 20-37.5 | 30 days supply | Qty: 180 | Fill #3

## 2018-09-27 MED FILL — CYCLOBENZAPRINE 10 MG TAB: 10 | 20 days supply | Qty: 60 | Fill #0

## 2018-09-27 MED FILL — METOPROLOL TARTRATE 50 MG T: 50 | 30 days supply | Qty: 60 | Fill #2

## 2018-09-27 MED FILL — SERTRALINE HCL 50 MG TABS: 50 | 30 days supply | Qty: 30 | Fill #3

## 2018-09-27 MED FILL — AMLODIPINE BESYLATE 5 MG TA: 5 | 30 days supply | Qty: 45 | Fill #3

## 2018-09-27 MED FILL — GABAPENTIN 300 MG CAPSULE: 300 | 30 days supply | Qty: 90 | Fill #2

## 2018-09-27 MED FILL — NAPROXEN 500 MG TABLET: 500 | 30 days supply | Qty: 60 | Fill #0

## 2018-10-29 ENCOUNTER — Telehealth: Payer: Self-pay | Admitting: Internal Medicine

## 2018-10-29 NOTE — Telephone Encounter (Signed)
Called patient and LVM to return call and reschedule his appointment for a physical.

## 2018-10-30 ENCOUNTER — Ambulatory Visit: Payer: Self-pay | Admitting: Internal Medicine

## 2018-11-05 MED FILL — ALLOPURINOL 300 MG TAB: 300 | 30 days supply | Qty: 30 | Fill #3

## 2018-11-05 MED FILL — GABAPENTIN 300 MG CAPSULE: 300 | 30 days supply | Qty: 90 | Fill #3

## 2018-11-05 MED FILL — SERTRALINE HCL 50 MG TABS: 50 | 30 days supply | Qty: 30 | Fill #4

## 2018-11-05 MED FILL — AMLODIPINE BESYLATE 5 MG TA: 5 | 30 days supply | Qty: 45 | Fill #4

## 2018-11-05 MED FILL — METOPROLOL TARTRATE 50 MG T: 50 | 30 days supply | Qty: 60 | Fill #3

## 2018-11-29 ENCOUNTER — Encounter (HOSPITAL_COMMUNITY): Payer: Self-pay | Admitting: Emergency Medicine

## 2018-11-29 ENCOUNTER — Other Ambulatory Visit: Payer: Self-pay

## 2018-11-29 ENCOUNTER — Emergency Department (HOSPITAL_COMMUNITY)
Admission: EM | Admit: 2018-11-29 | Discharge: 2018-11-29 | Disposition: A | Discharge status: Home / Self Care | Payer: BC Managed Care – PPO | Attending: Emergency Medicine | Admitting: Emergency Medicine

## 2018-11-29 DIAGNOSIS — G8929 Other chronic pain: Secondary | ICD-10-CM | POA: Diagnosis not present

## 2018-11-29 DIAGNOSIS — M545 Low back pain: Secondary | ICD-10-CM | POA: Diagnosis not present

## 2018-11-29 DIAGNOSIS — Z86718 Personal history of other venous thrombosis and embolism: Secondary | ICD-10-CM | POA: Insufficient documentation

## 2018-11-29 DIAGNOSIS — Z79899 Other long term (current) drug therapy: Secondary | ICD-10-CM | POA: Insufficient documentation

## 2018-11-29 DIAGNOSIS — I130 Hypertensive heart and chronic kidney disease with heart failure and stage 1 through stage 4 chronic kidney disease, or unspecified chronic kidney disease: Secondary | ICD-10-CM | POA: Insufficient documentation

## 2018-11-29 DIAGNOSIS — I5042 Chronic combined systolic (congestive) and diastolic (congestive) heart failure: Secondary | ICD-10-CM | POA: Insufficient documentation

## 2018-11-29 DIAGNOSIS — N183 Chronic kidney disease, stage 3 (moderate): Secondary | ICD-10-CM | POA: Diagnosis not present

## 2018-11-29 DIAGNOSIS — Z8709 Personal history of other diseases of the respiratory system: Secondary | ICD-10-CM | POA: Insufficient documentation

## 2018-11-29 MED ORDER — PREDNISONE 20 MG PO TABS: 40.0000 mg | ORAL_TABLET | Freq: Every day | ORAL | 0 refills | Status: DC

## 2018-11-29 MED ORDER — METHOCARBAMOL 500 MG PO TABS
500.0000 mg | ORAL_TABLET | Freq: Once | ORAL | Status: AC
  Administered 2018-11-29: 500 mg via ORAL
  Filled 2018-11-29: qty 1

## 2018-11-29 MED ORDER — KETOROLAC TROMETHAMINE 30 MG/ML IJ SOLN
30.0000 mg | Freq: Once | INTRAMUSCULAR | Status: AC
  Administered 2018-11-29: 30 mg via INTRAMUSCULAR
  Filled 2018-11-29: qty 1

## 2018-11-29 MED ORDER — METHOCARBAMOL 500 MG PO TABS: 500.0000 mg | ORAL_TABLET | Freq: Two times a day (BID) | ORAL | 0 refills | Status: AC

## 2018-11-29 MED ORDER — LIDOCAINE 5 % EX PTCH
1.0000 | MEDICATED_PATCH | CUTANEOUS | Status: DC
  Administered 2018-11-29: 1 via TRANSDERMAL
  Filled 2018-11-29: qty 1

## 2018-11-29 NOTE — ED Provider Notes (Signed)
[facility] EMERGENCY DEPARTMENT Provider Note   CSN: [id number] Arrival date & time: 11/29/18  0623    History   Chief Complaint Chief Complaint  Patient presents with  . Back Pain    HPI Rickey Calderon is a 53 y.o. male who presents with low back pain. PMH significant for obesity, chronic back pain, asthma, CHF, HTN. He states that he is having a flare of his low back pain with sciatica for the past couple of days. No injury or trauma. The pain is constant. It sometimes radiates to the right buttocks and sometimes to the left. Pain is worse with ambulation, movement. He can't get in a comfortable position. He is taking Naproxen and Flexeril without relief. He has never had an MRI. For his job he has to stand for long periods of time so decided to come to the ED for treatment. No fever, syncope, trauma, unexplained weight loss, hx of cancer, loss of bowel/bladder function, saddle anesthesia, urinary retention, IVDU.      HPI  Past Medical History:  Diagnosis Date  . Asthma   . Bronchitis   . CHF (congestive heart failure) (HCC)    EF 30% by echo,  EF 50% Nov 2017  . Depression   . Femoral thrombosis (HCC)   . Gout   . Hypertension   . Morbid obesity (HCC)   . Prostatitis   . Seasonal allergies   . Sleep apnea    BiPAP    Patient Active Problem List   Diagnosis Date Noted  . Disturbance in sleep behavior 08/01/2017  . Hypertensive cardiomyopathy, without heart failure (HCC) 05/17/2016  . 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 OSA (obstructive sleep apnea) 04/22/2016  . Exertional dyspnea 04/22/2016  . Obesity hypoventilation syndrome (HCC) 04/22/2016  . CKD (chronic kidney disease) stage 3, GFR 30-59 ml/min (HCC) 02/02/2016  . Chronic combined systolic and diastolic heart failure, NYHA class 2 (HCC) 09/29/2015  . Chronic neck pain 09/21/2015  . Asthma   . Depression 07/10/2015  . Financial difficulties 04/30/2015  .  Morbid obesity (HCC) 03/31/2015  . Chronic low back pain 03/31/2015  . OSA on CPAP 07/25/2014  . Essential hypertension   . Gout   . Seasonal allergies   . Chronic prostatitis   . Hip pain 01/11/2013  . Otitis media of left ear 01/11/2013  . Plantar wart of left foot 01/11/2013    Past Surgical History:  Procedure Laterality Date  . ABDOMINAL AORTAGRAM N/A 07/28/2014   Procedure: ABDOMINAL Ronny Flurry;  Surgeon: Chuck Hint, MD;  Location: [facility] CATH LAB;  Service: Cardiovascular;  Laterality: N/A;  . LOWER EXTREMITY ANGIOGRAM Bilateral 07/28/2014   Procedure: LOWER EXTREMITY ANGIOGRAM;  Surgeon: Chuck Hint, MD;  Location: [facility] CATH LAB;  Service: Cardiovascular;  Laterality: Bilateral;  . THROMBECTOMY FEMORAL ARTERY Right 08/01/2014   Procedure: THROMBECTOMY RIGHT LEG SFA, Anterior tibial, and perineal arteries with intraoperative arteriograms;  Surgeon: Nada Libman, MD;  Location: [facility] OR;  Service: Vascular;  Laterality: Right;        Home Medications    Prior to Admission medications   Medication Sig Start Date End Date Taking? Authorizing Provider  acetaminophen-codeine (TYLENOL #3) 300-30 MG tablet TAKE 1 TABLET BY MOUTH EVERY 8 HOURS AS NEEDED FOR MODERATE PAIN 08/01/17   Marcine Matar, MD  albuterol (PROVENTIL HFA;VENTOLIN HFA) 108 (90 Base) MCG/ACT inhaler Inhale 2 puffs into the lungs every 6 (six) hours as needed for  wheezing or shortness of breath. 06/26/18   Anders SimmondsMcClung, Angela M, PA-C  allopurinol (ZYLOPRIM) 300 MG tablet Take 1 tablet (300 mg total) by mouth daily. 06/26/18   Anders SimmondsMcClung, Angela M, PA-C  amLODipine (NORVASC) 5 MG tablet Take 1.5 tablets (7.5 mg total) by mouth daily. 06/26/18   Anders SimmondsMcClung, Angela M, PA-C  buPROPion (WELLBUTRIN SR) 150 MG 12 hr tablet Take 1 tablet (150 mg total) by mouth 2 (two) times daily. Patient not taking: Reported on 06/26/2018 07/21/17   Marcine MatarJohnson, Deborah B, MD  cetirizine (ZYRTEC) 10 MG tablet Take 1 tablet (10 mg total) by  mouth daily. 09/22/17   Marcine MatarJohnson, Deborah B, MD  cyclobenzaprine (FLEXERIL) 10 MG tablet 1/2-1 tablet 3 times daily prn muscle spasms 09/20/18   Georgian CoMcClung, Angela M, PA-C  diclofenac sodium (VOLTAREN) 1 % GEL Apply 2 g topically 4 (four) times daily. 03/20/18   Marcine MatarJohnson, Deborah B, MD  fluticasone (FLONASE) 50 MCG/ACT nasal spray Place 1 spray into both nostrils daily. 06/26/18   Anders SimmondsMcClung, Angela M, PA-C  Fluticasone-Salmeterol (ADVAIR) 100-50 MCG/DOSE AEPB Inhale 1 puff into the lungs 2 (two) times daily. 06/26/18   Anders SimmondsMcClung, Angela M, PA-C  furosemide (LASIX) 40 MG tablet Take 1 tablet (40 mg total) by mouth daily. 06/26/18   Anders SimmondsMcClung, Angela M, PA-C  gabapentin (NEURONTIN) 300 MG capsule Take 1 capsule (300 mg total) by mouth 3 (three) times daily. 06/26/18   Anders SimmondsMcClung, Angela M, PA-C  isosorbide-hydrALAZINE (BIDIL) 20-37.5 MG tablet Take 2 tablets by mouth 3 (three) times daily. 06/26/18   Anders SimmondsMcClung, Angela M, PA-C  metoprolol tartrate (LOPRESSOR) 50 MG tablet Take 1 tablet (50 mg total) by mouth 2 (two) times daily. 06/26/18   Anders SimmondsMcClung, Angela M, PA-C  naproxen (NAPROSYN) 500 MG tablet Take 1 tablet (500 mg total) by mouth 2 (two) times daily with a meal. 09/20/18   McClung, Marzella SchleinAngela M, PA-C  Potassium Chloride ER 20 MEQ TBCR Take 1 tablet by mouth daily. 06/26/18   Anders SimmondsMcClung, Angela M, PA-C  sertraline (ZOLOFT) 50 MG tablet Take 1 tablet (50 mg total) by mouth daily. 06/26/18   Anders SimmondsMcClung, Angela M, PA-C  tamsulosin (FLOMAX) 0.4 MG CAPS capsule TAKE 1 CAPSULE BY MOUTH DAILY AFTER SUPPER. 06/26/18   Anders SimmondsMcClung, Angela M, PA-C    Family History Family History  Problem Relation Age of Onset  . Diabetes Father   . Hyperlipidemia Father     Social History Social History   Tobacco Use  . Smoking status: Never Smoker  . Smokeless tobacco: Never Used  Substance Use Topics  . Alcohol use: No  . Drug use: No     Allergies   Patient has no known allergies.   Review of Systems Review of Systems   Musculoskeletal: Positive for back pain.  Neurological: Negative for weakness and numbness.     Physical Exam Updated Vital Signs BP (!) 145/102   Pulse 76   Temp 97.9 F (36.6 C) (Oral)   Resp 15   SpO2 98%   Physical Exam Vitals signs and nursing note reviewed.  Constitutional:      General: He is not in acute distress.    Appearance: He is well-developed. He is obese. He is not ill-appearing.  HENT:     Head: Normocephalic and atraumatic.  Eyes:     General: No scleral icterus.       Right eye: No discharge.        Left eye: No discharge.     Conjunctiva/sclera: Conjunctivae normal.  Pupils: Pupils are equal, round, and reactive to light.  Neck:     Musculoskeletal: Normal range of motion.  Cardiovascular:     Rate and Rhythm: Normal rate.  Pulmonary:     Effort: Pulmonary effort is normal. No respiratory distress.  Abdominal:     General: There is no distension.  Musculoskeletal:     Comments: Back: Inspection: No masses, deformity. There is what appears to be a skin tear over the right lower back. PT states that he left an ice pack on too long Palpation: Diffuse low back tenderness. Strength: 5/5 in lower extremities and normal plantar and dorsiflexion Sensation: Intact sensation with light touch in lower extremities bilaterally SLR: Negative seated straight leg raise Gait: Normal gait   Skin:    General: Skin is warm and dry.  Neurological:     Mental Status: He is alert and oriented to person, place, and time.  Psychiatric:        Behavior: Behavior normal.      ED Treatments / Results  Labs (all labs ordered are listed, but only abnormal results are displayed) Labs Reviewed - No data to display  EKG None  Radiology No results found.  Procedures Procedures (including critical care time)  Medications Ordered in ED Medications  ketorolac (TORADOL) 30 MG/ML injection 30 mg (has no administration in time range)  methocarbamol (ROBAXIN)  tablet 500 mg (has no administration in time range)  lidocaine (LIDODERM) 5 % 1 patch (has no administration in time range)     Initial Impression / Assessment and Plan / ED Course  I have reviewed the triage vital signs and the nursing notes.  Pertinent labs & imaging results that were available during my care of the patient were reviewed by me and considered in my medical decision making (see chart for details).  53 year old male with acute on chronic back pain. He is hypertensive but otherwise vitals are normal.  He has diffuse low back tenderness on exam.  He has normal lower extremity strength and is ambulatory.  There are no red flags in history.  We will treat for acute on chronic pain.  He states he has had relief with prednisone in the past.  Although it does not sound like true sciatica he states he has got significant relief with this medicine before.  Will prescribe prednisone.  He states he is not getting any relief with naproxen and Flexeril.  He is advised he could stop the naproxen and will try Robaxin instead.  He was also given a lidocaine patch.  He is advised to follow-up with his family doctor.  Work note was given.   Final Clinical Impressions(s) / ED Diagnoses   Final diagnoses:  Chronic bilateral low back pain, unspecified whether sciatica present    ED Discharge Orders    None       Bethel Born, PA-C 11/29/18 1610    Derwood Kaplan, MD 11/29/18 [phone]

## 2018-11-29 NOTE — ED Triage Notes (Addendum)
Patient reports bilateral lower back pain for the past few days, states that pain is worse with walking/standing, he reports hx of sciatica and thinks he may be having a flare. Patient has been taking flexoril without relief.

## 2018-11-29 NOTE — Discharge Instructions (Addendum)
Start prednisone and take as directed Take Robaxin (muscle relaxer) as needed for muscle pain and spasms. Do not take the Flexeril with this medicine Please stop the Naproxen if you feel it has not been helping you Please follow up with your doctor

## 2018-12-06 MED FILL — AMLODIPINE BESYLATE 5 MG TA: 5 | 30 days supply | Qty: 45 | Fill #5

## 2018-12-06 MED FILL — ALLOPURINOL 300 MG TAB: 300 | 30 days supply | Qty: 30 | Fill #4

## 2018-12-06 MED FILL — SERTRALINE HCL 50 MG TABS: 50 | 30 days supply | Qty: 30 | Fill #5

## 2018-12-06 MED FILL — GABAPENTIN 300 MG CAPSULE: 300 | 30 days supply | Qty: 90 | Fill #4

## 2018-12-06 MED FILL — METOPROLOL TARTRATE 50 MG T: 50 | 30 days supply | Qty: 60 | Fill #4

## 2018-12-12 ENCOUNTER — Encounter (HOSPITAL_COMMUNITY): Payer: Self-pay | Admitting: Emergency Medicine

## 2018-12-12 ENCOUNTER — Emergency Department (HOSPITAL_COMMUNITY): Payer: BC Managed Care – PPO

## 2018-12-12 ENCOUNTER — Other Ambulatory Visit: Payer: Self-pay

## 2018-12-12 ENCOUNTER — Emergency Department (HOSPITAL_COMMUNITY)
Admission: EM | Admit: 2018-12-12 | Discharge: 2018-12-12 | Disposition: A | Discharge status: Home / Self Care | Payer: BC Managed Care – PPO | Attending: Emergency Medicine | Admitting: Emergency Medicine

## 2018-12-12 DIAGNOSIS — I110 Hypertensive heart disease with heart failure: Secondary | ICD-10-CM | POA: Insufficient documentation

## 2018-12-12 DIAGNOSIS — M545 Low back pain, unspecified: Secondary | ICD-10-CM

## 2018-12-12 DIAGNOSIS — M5489 Other dorsalgia: Secondary | ICD-10-CM | POA: Diagnosis not present

## 2018-12-12 DIAGNOSIS — M48061 Spinal stenosis, lumbar region without neurogenic claudication: Secondary | ICD-10-CM | POA: Insufficient documentation

## 2018-12-12 DIAGNOSIS — J45909 Unspecified asthma, uncomplicated: Secondary | ICD-10-CM | POA: Diagnosis not present

## 2018-12-12 DIAGNOSIS — M549 Dorsalgia, unspecified: Secondary | ICD-10-CM | POA: Diagnosis not present

## 2018-12-12 DIAGNOSIS — R52 Pain, unspecified: Secondary | ICD-10-CM | POA: Diagnosis not present

## 2018-12-12 DIAGNOSIS — Z79899 Other long term (current) drug therapy: Secondary | ICD-10-CM | POA: Diagnosis not present

## 2018-12-12 DIAGNOSIS — I509 Heart failure, unspecified: Secondary | ICD-10-CM | POA: Insufficient documentation

## 2018-12-12 DIAGNOSIS — R609 Edema, unspecified: Secondary | ICD-10-CM | POA: Diagnosis not present

## 2018-12-12 LAB — CBC WITH DIFFERENTIAL/PLATELET
Abs Immature Granulocytes: 0.09 10*3/uL — ABNORMAL HIGH (ref 0.00–0.07)
Basophils Absolute: 0 10*3/uL (ref 0.0–0.1)
Basophils Relative: 0 %
Eosinophils Absolute: 0.4 10*3/uL (ref 0.0–0.5)
Eosinophils Relative: 4 %
HCT: 39.4 % (ref 39.0–52.0)
Hemoglobin: 11.9 g/dL — ABNORMAL LOW (ref 13.0–17.0)
Immature Granulocytes: 1 %
Lymphocytes Relative: 17 %
Lymphs Abs: 1.6 10*3/uL (ref 0.7–4.0)
MCH: 28.7 pg (ref 26.0–34.0)
MCHC: 30.2 g/dL (ref 30.0–36.0)
MCV: 94.9 fL (ref 80.0–100.0)
Monocytes Absolute: 1 10*3/uL (ref 0.1–1.0)
Monocytes Relative: 11 %
Neutro Abs: 6.1 10*3/uL (ref 1.7–7.7)
Neutrophils Relative %: 67 %
Platelets: 173 10*3/uL (ref 150–400)
RBC: 4.15 MIL/uL — ABNORMAL LOW (ref 4.22–5.81)
RDW: 14.8 % (ref 11.5–15.5)
WBC: 9.2 10*3/uL (ref 4.0–10.5)
nRBC: 0 % (ref 0.0–0.2)

## 2018-12-12 LAB — BASIC METABOLIC PANEL
Anion gap: 8 (ref 5–15)
BUN: 22 mg/dL — ABNORMAL HIGH (ref 6–20)
CO2: 27 mmol/L (ref 22–32)
Calcium: 8.7 mg/dL — ABNORMAL LOW (ref 8.9–10.3)
Chloride: 105 mmol/L (ref 98–111)
Creatinine, Ser: 1.13 mg/dL (ref 0.61–1.24)
GFR calc Af Amer: >60 mL/min (ref >60)
GFR calc non Af Amer: >60 mL/min (ref >60)
Glucose, Bld: 104 mg/dL — ABNORMAL HIGH (ref 70–99)
Potassium: 3.6 mmol/L (ref 3.5–5.1)
Sodium: 140 mmol/L (ref 135–145)

## 2018-12-12 LAB — URINALYSIS, ROUTINE W REFLEX MICROSCOPIC
Bilirubin Urine: NEGATIVE
Glucose, UA: NEGATIVE mg/dL
Hgb urine dipstick: NEGATIVE
Ketones, ur: NEGATIVE mg/dL
Leukocytes,Ua: NEGATIVE
Nitrite: NEGATIVE
Protein, ur: NEGATIVE mg/dL
Specific Gravity, Urine: 1.009 (ref 1.005–1.030)
pH: 5 (ref 5.0–8.0)

## 2018-12-12 MED ORDER — DEXAMETHASONE SODIUM PHOSPHATE 10 MG/ML IJ SOLN
10.0000 mg | Freq: Once | INTRAMUSCULAR | Status: AC
  Administered 2018-12-12: 07:00:00 10 mg via INTRAVENOUS
  Filled 2018-12-12: qty 1

## 2018-12-12 MED ORDER — MORPHINE SULFATE (PF) 4 MG/ML IV SOLN
4.0000 mg | Freq: Once | INTRAVENOUS | Status: AC
  Administered 2018-12-12: 4 mg via INTRAVENOUS
  Filled 2018-12-12: qty 1

## 2018-12-12 MED ORDER — ONDANSETRON HCL 4 MG/2ML IJ SOLN
4.0000 mg | Freq: Once | INTRAMUSCULAR | Status: AC
  Administered 2018-12-12: 07:00:00 4 mg via INTRAVENOUS
  Filled 2018-12-12: qty 2

## 2018-12-12 MED ORDER — HYDROCODONE-ACETAMINOPHEN 5-325 MG PO TABS: 1.0000 | ORAL_TABLET | Freq: Four times a day (QID) | ORAL | 0 refills | Status: DC | PRN

## 2018-12-12 NOTE — ED Notes (Signed)
Pt taken to MRI  

## 2018-12-12 NOTE — Discharge Instructions (Signed)
Take tylenol, motrin for pain   Take vicodin for severe pain   See your doctor. You need to see neurosurgery as well   Return to ER if you have worse back pain, trouble urinating, trouble walking.

## 2018-12-12 NOTE — ED Provider Notes (Signed)
TIME SEEN: 6:31 AM  CHIEF COMPLAINT: Back pain  HPI: Patient is a 53 year old male with history of CHF, hypertension, morbid obesity who presents to the emergency department with low back pain.  Was seen here on June 4 for the same.  Discharged on Robaxin and prednisone.  States pain worsening and he feels very stiff and has a hard time moving.  States he has had intermittent right leg weakness for weeks but this is worsened.  Also had an episode of urinary incontinence.  No bowel incontinence or urinary retention.  No fever.  No history of HIV, diabetes or other immunocompromise state.  No history of back surgery or epidural injections.  He denies numbness or tingling.  No cough, chest pain or shortness of breath.  Does have some peripheral edema but states he does not take his Lasix regularly because it makes him have to pee frequently.  He did not take his Lasix the day that he had urinary incontinence.  His pain wraps around into his groin.  No dysuria, testicular pain or swelling, penile discharge.  No history of kidney stones.   ROS: See HPI Constitutional: no fever  Eyes: no drainage  ENT: no runny nose   Cardiovascular:  no chest pain  Resp: no SOB  GI: no vomiting GU: no dysuria Integumentary: no rash  Allergy: no hives  Musculoskeletal:  leg swelling  Neurological: no slurred speech ROS otherwise negative  PAST MEDICAL HISTORY/PAST SURGICAL HISTORY:  Past Medical History:  Diagnosis Date  . Asthma   . Bronchitis   . CHF (congestive heart failure) (HCC)    EF 30% by echo,  EF 50% Nov 2017  . Depression   . Femoral thrombosis ([place])   . Gout   . Hypertension   . Morbid obesity ([place])   . Prostatitis   . Seasonal allergies   . Sleep apnea    BiPAP    MEDICATIONS:  Prior to Admission medications   Medication Sig Start Date End Date Taking? Authorizing Provider  acetaminophen-codeine (TYLENOL #3) 300-30 MG tablet TAKE 1 TABLET BY MOUTH EVERY 8 HOURS AS NEEDED FOR MODERATE  PAIN 08/01/17   Ladell Pier, MD  albuterol (PROVENTIL HFA;VENTOLIN HFA) 108 (90 Base) MCG/ACT inhaler Inhale 2 puffs into the lungs every 6 (six) hours as needed for wheezing or shortness of breath. 06/26/18   Argentina Donovan, PA-C  allopurinol (ZYLOPRIM) 300 MG tablet Take 1 tablet (300 mg total) by mouth daily. 06/26/18   Argentina Donovan, PA-C  amLODipine (NORVASC) 5 MG tablet Take 1.5 tablets (7.5 mg total) by mouth daily. 06/26/18   Argentina Donovan, PA-C  buPROPion (WELLBUTRIN SR) 150 MG 12 hr tablet Take 1 tablet (150 mg total) by mouth 2 (two) times daily. Patient not taking: Reported on 06/26/2018 07/21/17   Ladell Pier, MD  cetirizine (ZYRTEC) 10 MG tablet Take 1 tablet (10 mg total) by mouth daily. 09/22/17   Ladell Pier, MD  cyclobenzaprine (FLEXERIL) 10 MG tablet 1/2-1 tablet 3 times daily prn muscle spasms 09/20/18   Freeman Caldron M, PA-C  diclofenac sodium (VOLTAREN) 1 % GEL Apply 2 g topically 4 (four) times daily. 03/20/18   Ladell Pier, MD  fluticasone (FLONASE) 50 MCG/ACT nasal spray Place 1 spray into both nostrils daily. 06/26/18   Argentina Donovan, PA-C  Fluticasone-Salmeterol (ADVAIR) 100-50 MCG/DOSE AEPB Inhale 1 puff into the lungs 2 (two) times daily. 06/26/18   Argentina Donovan, PA-C  furosemide (LASIX)  40 MG tablet Take 1 tablet (40 mg total) by mouth daily. 06/26/18   Anders SimmondsMcClung, Angela M, PA-C  gabapentin (NEURONTIN) 300 MG capsule Take 1 capsule (300 mg total) by mouth 3 (three) times daily. 06/26/18   Anders SimmondsMcClung, Angela M, PA-C  isosorbide-hydrALAZINE (BIDIL) 20-37.5 MG tablet Take 2 tablets by mouth 3 (three) times daily. 06/26/18   Anders SimmondsMcClung, Angela M, PA-C  methocarbamol (ROBAXIN) 500 MG tablet Take 1 tablet (500 mg total) by mouth 2 (two) times daily. 11/29/18   Bethel BornGekas, Kelly Marie, PA-C  metoprolol tartrate (LOPRESSOR) 50 MG tablet Take 1 tablet (50 mg total) by mouth 2 (two) times daily. 06/26/18   Anders SimmondsMcClung, Angela M, PA-C  naproxen (NAPROSYN)  500 MG tablet Take 1 tablet (500 mg total) by mouth 2 (two) times daily with a meal. 09/20/18   McClung, Marzella SchleinAngela M, PA-C  Potassium Chloride ER 20 MEQ TBCR Take 1 tablet by mouth daily. 06/26/18   Anders SimmondsMcClung, Angela M, PA-C  predniSONE (DELTASONE) 20 MG tablet Take 2 tablets (40 mg total) by mouth daily. 11/29/18   Bethel BornGekas, Kelly Marie, PA-C  sertraline (ZOLOFT) 50 MG tablet Take 1 tablet (50 mg total) by mouth daily. 06/26/18   Anders SimmondsMcClung, Angela M, PA-C  tamsulosin (FLOMAX) 0.4 MG CAPS capsule TAKE 1 CAPSULE BY MOUTH DAILY AFTER SUPPER. 06/26/18   Anders SimmondsMcClung, Angela M, PA-C    ALLERGIES:  No Known Allergies  SOCIAL HISTORY:  Social History   Tobacco Use  . Smoking status: Never Smoker  . Smokeless tobacco: Never Used  Substance Use Topics  . Alcohol use: No    FAMILY HISTORY: Family History  Problem Relation Age of Onset  . Diabetes Father   . Hyperlipidemia Father     EXAM: BP (!) 142/91 (BP Location: Right Arm)   Pulse 82   Temp 97.9 F (36.6 C) (Oral)   Resp 15   Ht 5\' 7"  (1.702 m)   Wt (!) 136.5 kg   SpO2 97%   BMI 47.14 kg/m  CONSTITUTIONAL: Alert and oriented and responds appropriately to questions. Well-appearing; well-nourished, morbidly obese HEAD: Normocephalic EYES: Conjunctivae clear, pupils appear equal, EOMI ENT: normal nose; moist mucous membranes NECK: Supple, no meningismus, no nuchal rigidity, no LAD  CARD: RRR; S1 and S2 appreciated; no murmurs, no clicks, no rubs, no gallops RESP: Normal chest excursion without splinting or tachypnea; breath sounds clear and equal bilaterally; no wheezes, no rhonchi, no rales, no hypoxia or respiratory distress, speaking full sentences ABD/GI: Normal bowel sounds; non-distended; soft, non-tender, no rebound, no guarding, no peritoneal signs, no hepatosplenomegaly GU:  Normal external genitalia, circumcised male, normal penile shaft, no blood or discharge at the urethral meatus, no testicular masses or tenderness on exam, no  scrotal masses or swelling, no hernias appreciated, 2+ femoral pulses bilaterally; no perineal erythema, warmth, subcutaneous air or crepitus; no high riding testicle, normal bilateral cremasteric reflex.  Chaperone present for exam. BACK:  The back appears normal and is tender to palpation over the bilateral lumbar paraspinal muscles worse on the right side with some midline tenderness without step-off or deformity.  No redness, warmth, ecchymosis, swelling or other lesions noted.  No pain over the cervical or thoracic spine. EXT: Normal ROM in all joints; non-tender to palpation; no edema; normal capillary refill; no cyanosis, no calf tenderness or swelling    SKIN: Normal color for age and race; warm; no rash NEURO: Moves all extremities equally, sensation to light touch intact diffusely without saddle anesthesia, patient is able to  stand but this causes increasing pain, normal gait, no clonus, normal deep tendon reflexes in bilateral lower extremities PSYCH: The patient's mood and manner are appropriate. Grooming and personal hygiene are appropriate.  MEDICAL DECISION MAKING: Patient here with lower back pain.  States he has had worsening right leg weakness but now one episode of incontinence and his post void residual here is greater than 130 mL.  He does have pain that wraps around into his groin but his genital exam is normal.  He has no urinary symptoms.  Will obtain MRI of the lumbar spine without contrast rule out cauda equina.  No recent infectious symptoms.  Will give pain medication, Decadron.  It appears he previously was [facility] but states that he no longer receives pain medication.  He previously was receiving Tylenol 3 but it appears the last time he received this medication was July 2019.  ED PROGRESS: 7:00 AM  Signed out to Dr. Silverio LayYao.  Labs, urine, MRI of the lumbar spine without contrast pending.  If no neurosurgical emergency found,  patient will be discharged home with prescription of pain medication.  States he is no longer on medication contract.  Was discharged on prednisone and Robaxin without significant relief on the 4th.   I reviewed all nursing notes, vitals, pertinent previous records, EKGs, lab and urine results, imaging (as available).      Neziah Braley, Layla MawKristen N, DO 12/12/18 0710

## 2018-12-12 NOTE — ED Triage Notes (Signed)
Pt coming from home this AM with complaints of intermittent back pain that radiates to groin area. Pt has hx of CHF. Compliant with all other meds except Lasix. Some bilateral edema noted. Reports he was recently admitted for back pain but he is still having the pain. BP 160/100, all other vitals and CBG WDL.

## 2018-12-12 NOTE — ED Provider Notes (Signed)
  Physical Exam  BP (!) 135/91   Pulse 63   Temp 97.9 F (36.6 C) (Oral)   Resp 18   Ht 5\' 7"  (1.702 m)   Wt (!) 136.5 kg   SpO2 92%   BMI 47.14 kg/m   Physical Exam  ED Course/Procedures     Procedures  MDM  Patient care assumed at 7 am. Patient had worsening back pain and an episode of urinary incontinence but has no saddle anesthesia. Sign out pending MRI lumbar spine.   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 Pain controlled now. Will dc home with vicodin, neurosurgery follow up     Drenda Freeze, MD 12/12/18 [phone]

## 2018-12-19 ENCOUNTER — Inpatient Hospital Stay: Payer: BC Managed Care – PPO | Admitting: Internal Medicine

## 2019-01-01 ENCOUNTER — Other Ambulatory Visit: Payer: Self-pay

## 2019-01-01 ENCOUNTER — Encounter: Payer: Self-pay | Admitting: Internal Medicine

## 2019-01-01 ENCOUNTER — Ambulatory Visit: Payer: BC Managed Care – PPO | Attending: Internal Medicine | Admitting: Internal Medicine

## 2019-01-01 ENCOUNTER — Ambulatory Visit: Payer: Self-pay | Admitting: Internal Medicine

## 2019-01-01 VITALS — BP 135/88 | HR 93 | Temp 98.2°F | Resp 20 | Ht 67.0 in | Wt 330.0 lb

## 2019-01-01 DIAGNOSIS — G4733 Obstructive sleep apnea (adult) (pediatric): Secondary | ICD-10-CM

## 2019-01-01 DIAGNOSIS — G8929 Other chronic pain: Secondary | ICD-10-CM | POA: Diagnosis not present

## 2019-01-01 DIAGNOSIS — M545 Low back pain, unspecified: Secondary | ICD-10-CM

## 2019-01-01 DIAGNOSIS — M79675 Pain in left toe(s): Secondary | ICD-10-CM

## 2019-01-01 DIAGNOSIS — I10 Essential (primary) hypertension: Secondary | ICD-10-CM | POA: Diagnosis not present

## 2019-01-01 DIAGNOSIS — J4520 Mild intermittent asthma, uncomplicated: Secondary | ICD-10-CM

## 2019-01-01 DIAGNOSIS — Z6841 Body Mass Index (BMI) 40.0 and over, adult: Secondary | ICD-10-CM

## 2019-01-01 DIAGNOSIS — I429 Cardiomyopathy, unspecified: Secondary | ICD-10-CM

## 2019-01-01 MED ORDER — TRAMADOL HCL 50 MG PO TABS: 50.0000 mg | ORAL_TABLET | Freq: Three times a day (TID) | ORAL | 0 refills | Status: DC | PRN

## 2019-01-01 MED ORDER — ALBUTEROL SULFATE HFA 108 (90 BASE) MCG/ACT IN AERS: 2.0000 | INHALATION_SPRAY | Freq: Four times a day (QID) | RESPIRATORY_TRACT | 6 refills | Status: DC | PRN

## 2019-01-01 MED ORDER — CARVEDILOL 12.5 MG PO TABS: 12.5000 mg | ORAL_TABLET | Freq: Two times a day (BID) | ORAL | 3 refills | Status: DC

## 2019-01-01 MED FILL — CARVEDILOL 12.5 MG TABLET: 12.5 | 30 days supply | Qty: 60 | Fill #0

## 2019-01-01 MED FILL — VENTOLIN HFA 90 MCG INHALER: 108 (90 BAS | 25 days supply | Qty: 18 | Fill #0

## 2019-01-01 MED FILL — traMADol HCL 50 MG TABS: 50 | 7 days supply | Qty: 21 | Fill #0

## 2019-01-01 NOTE — Progress Notes (Signed)
Patient ID: Rickey Calderon, male    DOB: 01/02/1966  MRN: [id number]  CC: Annual Exam   Subjective: Rickey Calderon is a 53 y.o. male who presents for chronic ds management.   His concerns today include:  Pt with hx of HTN, OSA on BiPAP, CKD 3, gout, asthma (mild intermittent)and mild COPD (PFTs 04/2016), chronic LBP (on Tylenol #3, Flexeril and Gabapentin), obesity, depression,cardiomyopathy with EF inc from 25% to 45-50% 04/2016 (thought to be due to HTN. 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  Chronic LBP:  C/o stiffness and pain in his back when he sits for long time.  Pain intermittently throughout the day.  No pain in legs.  Some numbness in RT leg intermittently for over 1 yr Pain in back not too bad at nights.  Takes a muscle relaxant at nights. Took a Tramadol once from cousin which helped.   On Flexeril and Gabapentin.  Was getting Tyl#3 from me but has not f/u with me consistently so I stopped prescribing.  Requests to be placed on Tramadol to use as needed Seen in ER 2x last mth for flare of back pain with sciatica.  Had MRI done on last visit there 12/12/2018.  Results shown below: IMPRESSION: 1. 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 Overall findings appear similar to remote abdominal CT. No definite acute findings are identified, although the resulting spinal stenosis at each lumbar disc space level could be symptomatic. See details above. 2. The distal thoracic cord and conus appear normal, although there is tortuosity of the nerve roots in the spinal canal attributed to spinal stenosis.  HTN/cardiomyopathy:  Compliant with Bidil, Norvasc and Metoprolol.  Admits that he does not take Lasix regularly.  Takes it only on wkend because it makes him urinate too much when he is working during the week Tries to limit  salt in foods.  No device to check BP No CP/SOB. Some LE edema  Obesity:  Admits that he needs to do better with eating habits.  Thinks wgh today is off by several points as he is wearing large work boots.  Not much activity outside of work  OSA:  Uses BIPAP only about 3 x a wk.  Was out of supplies for a while but now that he has supplies "I just have to get back ino the routine of using it."    C/o pain in LT 4th toe x 3 wks.  No known injury.  No redness or swelling  Patient Active Problem List   Diagnosis Date Noted  . Disturbance in sleep behavior 08/01/2017  . Hypertensive cardiomyopathy, without heart failure ([place]) 05/17/2016  . OSA (obstructive sleep apnea) 04/22/2016  . Exertional dyspnea 04/22/2016  . Obesity hypoventilation syndrome ([place]) 04/22/2016  . CKD (chronic kidney disease) stage 3, GFR 30-59 ml/min (HCC) 02/02/2016  . Chronic combined systolic and diastolic heart failure, NYHA class 2 ([place]) 09/29/2015  . Chronic neck pain 09/21/2015  . Asthma   . Depression 07/10/2015  . Financial difficulties 04/30/2015  . Morbid obesity ([place]) 03/31/2015  . Chronic low back pain 03/31/2015  . OSA on CPAP 07/25/2014  . Essential hypertension   . Gout   . Seasonal allergies   . Chronic prostatitis   . Hip pain 01/11/2013  . Otitis media of left ear 01/11/2013  . Plantar wart of left foot 01/11/2013  Current Outpatient Medications on File Prior to Visit  Medication Sig Dispense Refill  . allopurinol (ZYLOPRIM) 300 MG tablet Take 1 tablet (300 mg total) by mouth daily. 30 tablet 5  . amLODipine (NORVASC) 5 MG tablet Take 1.5 tablets (7.5 mg total) by mouth daily. 45 tablet 5  . cetirizine (ZYRTEC) 10 MG tablet Take 1 tablet (10 mg total) by mouth daily. 30 tablet 3  . cyclobenzaprine (FLEXERIL) 10 MG tablet 1/2-1 tablet 3 times daily prn muscle spasms 60 tablet 0  . diclofenac sodium (VOLTAREN) 1 % GEL Apply 2 g topically 4 (four) times daily. 100 g 2  .  Fluticasone-Salmeterol (ADVAIR) 100-50 MCG/DOSE AEPB Inhale 1 puff into the lungs 2 (two) times daily. 60 each 5  . furosemide (LASIX) 40 MG tablet Take 1 tablet (40 mg total) by mouth daily. 30 tablet 5  . gabapentin (NEURONTIN) 300 MG capsule Take 1 capsule (300 mg total) by mouth 3 (three) times daily. 90 capsule 5  . isosorbide-hydrALAZINE (BIDIL) 20-37.5 MG tablet Take 2 tablets by mouth 3 (three) times daily. 504 tablet 2  . methocarbamol (ROBAXIN) 500 MG tablet Take 1 tablet (500 mg total) by mouth 2 (two) times daily. 20 tablet 0  . Potassium Chloride ER 20 MEQ TBCR Take 1 tablet by mouth daily. 30 tablet 5  . sertraline (ZOLOFT) 50 MG tablet Take 1 tablet (50 mg total) by mouth daily. 90 tablet 1  . tamsulosin (FLOMAX) 0.4 MG CAPS capsule TAKE 1 CAPSULE BY MOUTH DAILY AFTER SUPPER. 30 capsule 6  . buPROPion (WELLBUTRIN SR) 150 MG 12 hr tablet Take 1 tablet (150 mg total) by mouth 2 (two) times daily. (Patient not taking: Reported on 06/26/2018) 60 tablet 0  . fluticasone (FLONASE) 50 MCG/ACT nasal spray Place 1 spray into both nostrils daily. (Patient not taking: Reported on 01/01/2019) 16 g 0   No current facility-administered medications on file prior to visit.     No Known Allergies  Social History   Socioeconomic History  . Marital status: Married    Spouse name: Not on file  . Number of children: Not on file  . Years of education: Not on file  . Highest education level: Not on file  Occupational History  . Not on file  Social Needs  . Financial resource strain: Not on file  . Food insecurity    Worry: Not on file    Inability: Not on file  . Transportation needs    Medical: Not on file    Non-medical: Not on file  Tobacco Use  . Smoking status: Never Smoker  . Smokeless tobacco: Never Used  Substance and Sexual Activity  . Alcohol use: No  . Drug use: No  . Sexual activity: Not on file  Lifestyle  . Physical activity    Days per week: Not on file    Minutes per  session: Not on file  . Stress: Not on file  Relationships  . Social Musicianconnections    Talks on phone: Not on file    Gets together: Not on file    Attends religious service: Not on file    Active member of club or organization: Not on file    Attends meetings of clubs or organizations: Not on file    Relationship status: Not on file  . Intimate partner violence    Fear of current or ex partner: Not on file    Emotionally abused: Not on file    Physically abused: Not  on file    Forced sexual activity: Not on file  Other Topics Concern  . Not on file  Social History Narrative  . Not on file    Family History  Problem Relation Age of Onset  . Diabetes Father   . Hyperlipidemia Father     Past Surgical History:  Procedure Laterality Date  . ABDOMINAL AORTAGRAM N/A 07/28/2014   Procedure: ABDOMINAL Ronny FlurryAORTAGRAM;  Surgeon: Chuck Hinthristopher S Dickson, MD;  [facility];  Service: Cardiovascular;  Laterality: N/A;  . LOWER EXTREMITY ANGIOGRAM Bilateral 07/28/2014   Procedure: LOWER EXTREMITY ANGIOGRAM;  Surgeon: Chuck Hinthristopher S Dickson, MD;  [facility];  Service: Cardiovascular;  Laterality: Bilateral;  . THROMBECTOMY FEMORAL ARTERY Right 08/01/2014   Procedure: THROMBECTOMY RIGHT LEG SFA, Anterior tibial, and perineal arteries with intraoperative arteriograms;  Surgeon: Nada LibmanVance W Brabham, MD;  Location: [facility] OR;  Service: Vascular;  Laterality: Right;    ROS: Review of Systems Negative except as stated above  PHYSICAL EXAM: BP 135/88 (BP Location: Left Arm, Patient Position: Sitting, Cuff Size: Large)   Pulse 93   Temp 98.2 F (36.8 C) (Oral)   Resp 20   Ht 5\' 7"  (1.702 m)   Wt (!) 330 lb (149.7 kg)   SpO2 98%   BMI 51.69 kg/m   Wt Readings from Last 3 Encounters:  01/01/19 (!) 330 lb (149.7 kg)  12/12/18 (!) 301 lb (136.5 kg)  06/26/18 (!) 306 lb (138.8 kg)   BP 132/90 Physical Exam General appearance - alert, well appearing, and in no distress Mental status -  normal mood, behavior, speech, dress, motor activity, and thought processes Mouth - mucous membranes moist, pharynx normal without lesions Neck - supple, no significant adenopathy Chest - clear to auscultation, no wheezes, rales or rhonchi, symmetric air entry Heart - normal rate, regular rhythm, normal S1, S2, no murmurs, rubs, clicks or gallops Musculoskeletal - LT foot.  No edema.  4th toe - no edema or erythema.  Good ROM Extremities - 1+ BL LE edema  CMP Latest Ref Rng & Units 12/12/2018 06/26/2018 11/21/2017  Glucose 70 - 99 mg/dL 629(B104(H) 77 284(X113(H)  BUN 6 - 20 mg/dL 32(G22(H) 40(N39(H) 18  Creatinine 0.61 - 1.24 mg/dL 0.271.13 2.53(G1.35(H) 6.44(I1.83(H)  Sodium 135 - 145 mmol/L 140 148(H) 140  Potassium 3.5 - 5.1 mmol/L 3.6 4.6 3.4(L)  Chloride 98 - 111 mmol/L 105 101 102  CO2 22 - 32 mmol/L 27 22 27   Calcium 8.9 - 10.3 mg/dL 3.4(V8.7(L) 9.0 8.9  Total Protein 6.0 - 8.5 g/dL - 7.2 -  Total Bilirubin 0.0 - 1.2 mg/dL - 0.6 -  Alkaline Phos 39 - 117 IU/L - 88 -  AST 0 - 40 IU/L - 13 -  ALT 0 - 44 IU/L - 12 -   Lipid Panel     Component Value Date/Time   CHOL 209 (H) 06/26/2018 1206   TRIG 93 06/26/2018 1206   HDL 82 06/26/2018 1206   CHOLHDL 2.5 06/26/2018 1206   CHOLHDL 3.1 03/31/2015 1148   VLDL 18 03/31/2015 1148   LDLCALC 108 (H) 06/26/2018 1206    CBC    Component Value Date/Time   WBC 9.2 12/12/2018 0654   RBC 4.15 (L) 12/12/2018 0654   HGB 11.9 (L) 12/12/2018 0654   HGB 13.6 06/26/2018 1206   HCT 39.4 12/12/2018 0654   HCT 41.8 06/26/2018 1206   PLT 173 12/12/2018 0654   PLT 210 06/26/2018 1206   MCV 94.9 12/12/2018 0654  MCV 89 06/26/2018 1206   MCH 28.7 12/12/2018 0654   MCHC 30.2 12/12/2018 0654   RDW 14.8 12/12/2018 0654   RDW 12.9 06/26/2018 1206   LYMPHSABS 1.6 12/12/2018 0654   LYMPHSABS 2.6 06/26/2018 1206   MONOABS 1.0 12/12/2018 0654   EOSABS 0.4 12/12/2018 0654   EOSABS 0.1 06/26/2018 1206   BASOSABS 0.0 12/12/2018 0654   BASOSABS 0.0 06/26/2018 1206    ASSESSMENT  AND PLAN: 1. Chronic low back pain, unspecified back pain laterality, unspecified whether sciatica present -advised to avoid taking other persons medications. Discussed trying him with Tramadol.  Went over possible SE and fact that it is a controlled subst. Potential for abuse and or dependence.  Did Control Subst Prescribing Agreement. NCCSRS reviewed - Ambulatory referral to Neurosurgery  2. Essential hypertension Not at goal. Change Metoprolol to Coreg.  Encourage daily use of Furosemide DASH diet discussed F/u with clinical pharmacist in 2 wks for BP recheck - carvedilol (COREG) 12.5 MG tablet; Take 1 tablet (12.5 mg total) by mouth 2 (two) times daily with a meal.  Dispense: 60 tablet; Refill: 3  3. OSA treated with BiPAP Encourage consistent nightly use  4. Morbid obesity due to excess calories (HCC) Dietary counseling given.  Pt willing to go to medical wgh management program - Amb Ref to Medical Weight Management  5. Cardiomyopathy, unspecified type (HCC) Need for better BP control.  See #2 above  6. Pain of toe of left foot ? Etiology.  Does not appear to be gout Observe for now  7. Mild intermittent asthma without complication - albuterol (VENTOLIN HFA) 108 (90 Base) MCG/ACT inhaler; Inhale 2 puffs into the lungs every 6 (six) hours as needed for wheezing or shortness of breath.  Dispense: 8 g; Refill: 6     Patient was given the opportunity to ask questions.  Patient verbalized understanding of the plan and was able to repeat key elements of the plan.   Orders Placed This Encounter  Procedures  . Amb Ref to Medical Weight Management  . Ambulatory referral to Neurosurgery     Requested Prescriptions   Signed Prescriptions Disp Refills  . albuterol (VENTOLIN HFA) 108 (90 Base) MCG/ACT inhaler 8 g 6    Sig: Inhale 2 puffs into the lungs every 6 (six) hours as needed for wheezing or shortness of breath.  . carvedilol (COREG) 12.5 MG tablet 60 tablet 3    Sig:  Take 1 tablet (12.5 mg total) by mouth 2 (two) times daily with a meal.  . traMADol (ULTRAM) 50 MG tablet 90 tablet 0    Sig: Take 1 tablet (50 mg total) by mouth every 8 (eight) hours as needed.    Return in about 2 months (around 03/04/2019).  Jonah Blueeborah Chanti Golubski, MD, FACP

## 2019-01-01 NOTE — Patient Instructions (Signed)
Please give patient an appointment with the clinical pharmacist in 2 weeks for repeat blood pressure check.  Stop metoprolol.  Start carvedilol 12.5 mg twice a day instead. Try decreasing the gabapentin to twice a day as this can cause swelling in the legs.  You should use your BiPAP machine every night.  I have referred you to a spine specialist.  I have referred you to medical weight management program.  Follow a [place] can do it! Limit sugary drinks.  Avoid sodas, sweet tea, sport or energy drinks, or fruit drinks.  Drink water, lo-fat milk, or diet drinks. Limit snack foods.   Cut back on candy, cake, cookies, chips, ice cream.  These are a special treat, only in small amounts. Eat plenty of vegetables.  Especially dark green, red, and orange vegetables. Aim for at least 3 servings a day. More is better! Include fruit in your daily diet.  Whole fruit is much healthier than fruit juice! Limit "white" bread, "white" pasta, "white" rice.   Choose "100% whole grain" products, brown or wild rice. Avoid fatty meats. Try "Meatless Monday" and choose eggs or beans one day a week.  When eating meat, choose lean meats like chicken, [place], and fish.  Grill, broil, or bake meats instead of frying, and eat poultry without the skin. Eat less salt.  Avoid frozen pizzas, frozen dinners and salty foods.  Use seasonings other than salt in cooking.  This can help blood pressure and keep you from swelling Beer, wine and liquor have calories.  If you can safely drink alcohol, limit to 1 drink per day for women, 2 drinks for men

## 2019-01-07 ENCOUNTER — Other Ambulatory Visit: Payer: Self-pay | Admitting: Physician Assistant

## 2019-01-07 DIAGNOSIS — M5136 Other intervertebral disc degeneration, lumbar region: Secondary | ICD-10-CM | POA: Diagnosis not present

## 2019-01-07 DIAGNOSIS — M5137 Other intervertebral disc degeneration, lumbosacral region: Secondary | ICD-10-CM | POA: Diagnosis not present

## 2019-01-07 DIAGNOSIS — M4726 Other spondylosis with radiculopathy, lumbar region: Secondary | ICD-10-CM | POA: Diagnosis not present

## 2019-01-07 DIAGNOSIS — F329 Major depressive disorder, single episode, unspecified: Secondary | ICD-10-CM

## 2019-01-07 DIAGNOSIS — Z6841 Body Mass Index (BMI) 40.0 and over, adult: Secondary | ICD-10-CM | POA: Diagnosis not present

## 2019-01-07 DIAGNOSIS — I10 Essential (primary) hypertension: Secondary | ICD-10-CM

## 2019-01-07 DIAGNOSIS — F32A Depression, unspecified: Secondary | ICD-10-CM

## 2019-01-07 MED FILL — AMLODIPINE BESYLATE 5 MG TA: 5 | 30 days supply | Qty: 45 | Fill #0

## 2019-01-07 MED FILL — GABAPENTIN 300 MG CAPSULE: 300 | 30 days supply | Qty: 90 | Fill #5

## 2019-01-07 MED FILL — SERTRALINE HCL 50 MG TABLET: 50 | 30 days supply | Qty: 30 | Fill #0

## 2019-01-07 MED FILL — ALLOPURINOL 300 MG TAB: 300 | 30 days supply | Qty: 30 | Fill #5

## 2019-01-14 ENCOUNTER — Telehealth: Payer: Self-pay | Admitting: Nurse Practitioner

## 2019-01-14 ENCOUNTER — Other Ambulatory Visit: Payer: Self-pay | Admitting: Neurosurgery

## 2019-01-14 DIAGNOSIS — M4726 Other spondylosis with radiculopathy, lumbar region: Secondary | ICD-10-CM

## 2019-01-14 NOTE — Telephone Encounter (Signed)
Phone call to patient to verify medication list and allergies for myelogram procedure. Pt instructed to hold zoloft, and tramadol for 48hrs prior to myelogram appointment time. Pt verbalized understanding. Pre and post procedure instructions reviewed with pt.

## 2019-01-28 ENCOUNTER — Ambulatory Visit
Admission: RE | Admit: 2019-01-28 | Discharge: 2019-01-28 | Disposition: A | Discharge status: Home / Self Care | Payer: BC Managed Care – PPO | Source: Ambulatory Visit | Attending: Neurosurgery | Admitting: Neurosurgery

## 2019-01-28 DIAGNOSIS — M545 Low back pain: Secondary | ICD-10-CM | POA: Diagnosis not present

## 2019-01-28 DIAGNOSIS — M4726 Other spondylosis with radiculopathy, lumbar region: Secondary | ICD-10-CM

## 2019-01-28 MED ORDER — IOPAMIDOL (ISOVUE-M 200) INJECTION 41%
20.0000 mL | Freq: Once | INTRAMUSCULAR | Status: AC
  Administered 2019-01-28: 20 mL via INTRATHECAL

## 2019-01-28 MED ORDER — DIAZEPAM 5 MG PO TABS: 10.0000 mg | ORAL_TABLET | Freq: Once | ORAL | Status: DC

## 2019-01-28 NOTE — Discharge Instructions (Signed)
Myelogram Discharge Instructions  1. Go home and rest quietly for the next 24 hours.  It is important to lie flat for the next 24 hours.  Get up only to go to the restroom.  You may lie in the bed or on a couch on your back, your stomach, your left side or your right side.  You may have one pillow under your head.  You may have pillows between your knees while you are on your side or under your knees while you are on your back.  2. DO NOT drive today.  Recline the seat as far back as it will go, while still wearing your seat belt, on the way home.  3. You may get up to go to the bathroom as needed.  You may sit up for 10 minutes to eat.  You may resume your normal diet and medications unless otherwise indicated.  Drink lots of extra fluids today and tomorrow.  4. The incidence of headache, nausea, or vomiting is about 5% (one in 20 patients).  If you develop a headache, lie flat and drink plenty of fluids until the headache goes away.  Caffeinated beverages may be helpful.  If you develop severe nausea and vomiting or a headache that does not go away with flat bed rest, call [phone].  5. You may resume normal activities after your 24 hours of bed rest is over; however, do not exert yourself strongly or do any heavy lifting tomorrow. If when you get up you have a headache when standing, go back to bed and force fluids for another 24 hours.  6. Call your physician for a follow-up appointment.  The results of your myelogram will be sent directly to your physician by the following day.  7. If you have any questions or if complications develop after you arrive home, please call [phone].  Discharge instructions have been explained to the patient.  The patient, or the person responsible for the patient, fully understands these instructions.  YOU MAY RESTART YOUR ZOLOFT AND TRAMADOL TOMORROW 01/29/2019 AT 10:30AM.

## 2019-01-28 NOTE — Progress Notes (Signed)
Pt reports he has been off Tramadol and Zoloft at least 48 hrs for myelogram procedure today.

## 2019-02-04 ENCOUNTER — Other Ambulatory Visit: Payer: Self-pay | Admitting: Internal Medicine

## 2019-02-04 ENCOUNTER — Other Ambulatory Visit: Payer: Self-pay | Admitting: Physician Assistant

## 2019-02-04 DIAGNOSIS — G8929 Other chronic pain: Secondary | ICD-10-CM

## 2019-02-04 DIAGNOSIS — M4726 Other spondylosis with radiculopathy, lumbar region: Secondary | ICD-10-CM | POA: Diagnosis not present

## 2019-02-04 DIAGNOSIS — M5126 Other intervertebral disc displacement, lumbar region: Secondary | ICD-10-CM | POA: Diagnosis not present

## 2019-02-04 MED FILL — AMLODIPINE BESYLATE 5 MG TA: 5 | 30 days supply | Qty: 45 | Fill #1

## 2019-02-04 MED FILL — SERTRALINE HCL 50 MG TABS: 50 | 30 days supply | Qty: 30 | Fill #1

## 2019-02-04 MED FILL — CYCLOBENZAPRINE 10 MG TAB: 10 | 20 days supply | Qty: 60 | Fill #0

## 2019-02-04 MED FILL — CARVEDILOL 12.5 MG TABLET: 12.5 | 30 days supply | Qty: 60 | Fill #1

## 2019-02-05 MED FILL — ALLOPURINOL 300 MG TAB: 300 | 30 days supply | Qty: 30 | Fill #0

## 2019-02-18 MED FILL — traMADol HCL 50 MG TABS: 50 | 7 days supply | Qty: 21 | Fill #1

## 2019-02-26 MED FILL — BIDIL TABLET: 20-37.5 | 30 days supply | Qty: 180 | Fill #0

## 2019-02-26 MED FILL — traMADol HCL 50 MG TABS: 50 | 7 days supply | Qty: 21 | Fill #1

## 2019-02-27 ENCOUNTER — Other Ambulatory Visit: Payer: Self-pay | Admitting: Pharmacist

## 2019-02-27 MED ORDER — HYDRALAZINE HCL 25 MG PO TABS: 75.0000 mg | ORAL_TABLET | Freq: Three times a day (TID) | ORAL | 2 refills | Status: DC

## 2019-02-27 MED ORDER — ISOSORBIDE DINITRATE 20 MG PO TABS: 40.0000 mg | ORAL_TABLET | Freq: Three times a day (TID) | ORAL | 2 refills | Status: DC

## 2019-02-27 MED FILL — ISOSORBIDE DN 20 MG TABLET: 20 | 30 days supply | Qty: 180 | Fill #0

## 2019-02-27 MED FILL — hydrALAZINE HCL 25 MG TABS: 25 | 30 days supply | Qty: 180 | Fill #0

## 2019-02-27 NOTE — Progress Notes (Signed)
Will send BiDil as two separate components for cost.

## 2019-03-05 MED FILL — CYCLOBENZAPRINE 10 MG TAB: 10 | 20 days supply | Qty: 60 | Fill #1

## 2019-03-05 MED FILL — SERTRALINE HCL 50 MG TABS: 50 | 30 days supply | Qty: 30 | Fill #2

## 2019-03-05 MED FILL — ALLOPURINOL 300 MG TAB: 300 | 30 days supply | Qty: 30 | Fill #1

## 2019-03-05 MED FILL — CARVEDILOL 12.5 MG TABLET: 12.5 | 30 days supply | Qty: 60 | Fill #2

## 2019-03-05 MED FILL — AMLODIPINE BESYLATE 5 MG TA: 5 | 30 days supply | Qty: 45 | Fill #2

## 2019-03-06 ENCOUNTER — Other Ambulatory Visit: Payer: Self-pay | Admitting: Internal Medicine

## 2019-03-06 DIAGNOSIS — G8929 Other chronic pain: Secondary | ICD-10-CM

## 2019-03-06 DIAGNOSIS — M542 Cervicalgia: Secondary | ICD-10-CM

## 2019-03-07 ENCOUNTER — Ambulatory Visit: Payer: BC Managed Care – PPO | Attending: Internal Medicine | Admitting: Internal Medicine

## 2019-03-07 ENCOUNTER — Other Ambulatory Visit: Payer: Self-pay

## 2019-03-07 MED FILL — DICLOFENAC SODIUM 3% GEL: 3 | 30 days supply | Qty: 100 | Fill #0

## 2019-03-08 MED FILL — hydrALAZINE HCL 25 MG TABS: 25 | 20 days supply | Qty: 180 | Fill #0

## 2019-03-08 MED FILL — traMADol HCL 50 MG TABS: 50 | 7 days supply | Qty: 21 | Fill #1

## 2019-03-08 MED FILL — ISOSORBIDE DN 20 MG TABLET: 20 | 30 days supply | Qty: 180 | Fill #0

## 2019-03-11 ENCOUNTER — Telehealth: Payer: Self-pay | Admitting: Internal Medicine

## 2019-03-11 NOTE — Telephone Encounter (Signed)
New Message    Pt states he is having surgery and needs Dr. Durenda Age approval before it can be scheduled but when I offered first available of 10/5 he states he needs something sooner. Please f/u

## 2019-03-12 NOTE — Telephone Encounter (Signed)
It okay to double book Dr. Wynetta Emery if she is not already double book

## 2019-03-28 NOTE — Telephone Encounter (Signed)
LVM to call back and schedule appt.

## 2019-04-02 ENCOUNTER — Telehealth: Payer: Self-pay | Admitting: *Deleted

## 2019-04-02 NOTE — Telephone Encounter (Signed)
Patient verified DOB Patient is scheduled 10-19 at 3:30 for clearance.

## 2019-04-03 ENCOUNTER — Other Ambulatory Visit: Payer: Self-pay | Admitting: Internal Medicine

## 2019-04-03 ENCOUNTER — Other Ambulatory Visit: Payer: Self-pay | Admitting: Physician Assistant

## 2019-04-03 DIAGNOSIS — M545 Low back pain, unspecified: Secondary | ICD-10-CM

## 2019-04-03 DIAGNOSIS — F329 Major depressive disorder, single episode, unspecified: Secondary | ICD-10-CM

## 2019-04-03 DIAGNOSIS — G8929 Other chronic pain: Secondary | ICD-10-CM

## 2019-04-03 DIAGNOSIS — F32A Depression, unspecified: Secondary | ICD-10-CM

## 2019-04-03 DIAGNOSIS — I10 Essential (primary) hypertension: Secondary | ICD-10-CM

## 2019-04-03 MED FILL — CARVEDILOL 12.5 MG TABLET: 12.5 | 30 days supply | Qty: 60 | Fill #3

## 2019-04-03 MED FILL — traMADol HCL 50 MG TABS: 50 | 7 days supply | Qty: 21 | Fill #2

## 2019-04-03 MED FILL — hydrALAZINE HCL 25 MG TABS: 25 | 30 days supply | Qty: 270 | Fill #1

## 2019-04-03 MED FILL — ISOSORBIDE DN 20 MG TABLET: 20 | 30 days supply | Qty: 180 | Fill #1

## 2019-04-03 MED FILL — ALLOPURINOL 300 MG TAB: 300 | 30 days supply | Qty: 30 | Fill #2

## 2019-04-05 MED FILL — CYCLOBENZAPRINE 10 MG TAB: 10 | 20 days supply | Qty: 60 | Fill #0

## 2019-04-05 MED FILL — SERTRALINE HCL 50 MG TABS: 50 | 30 days supply | Qty: 30 | Fill #0

## 2019-04-05 MED FILL — AMLODIPINE BESYLATE 5 MG TA: 5 | 30 days supply | Qty: 45 | Fill #0

## 2019-04-09 MED FILL — GABAPENTIN 300 MG CAPSULE: 300 | 30 days supply | Qty: 90 | Fill #0

## 2019-04-15 ENCOUNTER — Ambulatory Visit: Payer: BC Managed Care – PPO | Attending: Internal Medicine | Admitting: Internal Medicine

## 2019-04-15 ENCOUNTER — Encounter: Payer: Self-pay | Admitting: Internal Medicine

## 2019-04-15 ENCOUNTER — Other Ambulatory Visit: Payer: Self-pay

## 2019-04-15 VITALS — BP 106/71 | HR 87 | Temp 98.7°F | Ht 67.0 in | Wt 320.8 lb

## 2019-04-15 DIAGNOSIS — Z01818 Encounter for other preprocedural examination: Secondary | ICD-10-CM | POA: Diagnosis not present

## 2019-04-15 DIAGNOSIS — G8929 Other chronic pain: Secondary | ICD-10-CM

## 2019-04-15 DIAGNOSIS — I10 Essential (primary) hypertension: Secondary | ICD-10-CM | POA: Diagnosis not present

## 2019-04-15 DIAGNOSIS — Z125 Encounter for screening for malignant neoplasm of prostate: Secondary | ICD-10-CM

## 2019-04-15 DIAGNOSIS — R600 Localized edema: Secondary | ICD-10-CM

## 2019-04-15 DIAGNOSIS — F32A Depression, unspecified: Secondary | ICD-10-CM

## 2019-04-15 DIAGNOSIS — I110 Hypertensive heart disease with heart failure: Secondary | ICD-10-CM

## 2019-04-15 DIAGNOSIS — M25562 Pain in left knee: Secondary | ICD-10-CM

## 2019-04-15 DIAGNOSIS — F329 Major depressive disorder, single episode, unspecified: Secondary | ICD-10-CM

## 2019-04-15 DIAGNOSIS — J4520 Mild intermittent asthma, uncomplicated: Secondary | ICD-10-CM

## 2019-04-15 DIAGNOSIS — M65312 Trigger thumb, left thumb: Secondary | ICD-10-CM

## 2019-04-15 MED ORDER — SERTRALINE HCL 50 MG PO TABS: 50.0000 mg | ORAL_TABLET | Freq: Every day | ORAL | 0 refills | Status: DC

## 2019-04-15 MED ORDER — ALBUTEROL SULFATE HFA 108 (90 BASE) MCG/ACT IN AERS: 2.0000 | INHALATION_SPRAY | Freq: Four times a day (QID) | RESPIRATORY_TRACT | 6 refills | Status: AC | PRN

## 2019-04-15 MED ORDER — CYCLOBENZAPRINE HCL 10 MG PO TABS: ORAL_TABLET | ORAL | 1 refills | Status: DC

## 2019-04-15 MED ORDER — GABAPENTIN 300 MG PO CAPS: 300.0000 mg | ORAL_CAPSULE | Freq: Three times a day (TID) | ORAL | 5 refills | Status: AC

## 2019-04-15 MED ORDER — POTASSIUM CHLORIDE ER 20 MEQ PO TBCR: 1.0000 | EXTENDED_RELEASE_TABLET | Freq: Every day | ORAL | 5 refills | Status: AC

## 2019-04-15 MED ORDER — CARVEDILOL 12.5 MG PO TABS: 12.5000 mg | ORAL_TABLET | Freq: Two times a day (BID) | ORAL | 3 refills | Status: DC

## 2019-04-15 MED ORDER — DICLOFENAC SODIUM 3 % TD GEL: TRANSDERMAL | 0 refills | Status: DC

## 2019-04-15 MED ORDER — AMLODIPINE BESYLATE 5 MG PO TABS: ORAL_TABLET | ORAL | 3 refills | Status: DC

## 2019-04-15 MED ORDER — FUROSEMIDE 40 MG PO TABS: 40.0000 mg | ORAL_TABLET | Freq: Every day | ORAL | 5 refills | Status: DC

## 2019-04-15 MED FILL — FUROSEMIDE 40 MG TAB: 40 | 30 days supply | Qty: 30 | Fill #0

## 2019-04-15 MED FILL — DICLOFENAC SODIUM 3% GEL: 3 | 30 days supply | Qty: 100 | Fill #0

## 2019-04-15 NOTE — Progress Notes (Signed)
Surgical clearance   Med refills

## 2019-04-15 NOTE — Progress Notes (Signed)
Patient ID: Rickey Calderon, male    DOB: [birth year]  MRN: [id number]  CC: pre-op eval  Subjective: Rickey Calderon is a 53 y.o. male who presents for pre-op eval His concerns today include:  Pt with hx of HTN, OSA on BiPAP, CKD2- 3, gout, asthma (mild intermittent)and mild COPD (PFTs 04/2016), chronic LBP (on Tylenol #3, Flexeril and Gabapentin), obesity, depression,cardiomyopathy with EF inc from 25% to 45-50% 04/2016 (thought to be due to HTN. 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  Has seen Dr Franky Macho with [facility] for his chronic back issues.  He is spoken with patient about doing lumbar decompression without hardware on L1-5.  However he will need to see whether patient is medically optimized for surgery.  Today patient tells me that they plan to try steroid injections first and if it does not help then they will pursue the surgical option.  He has not had any previous major surgeries.  Hx of OSA.  On last visit, I advised using every night  BiPAP consistently.  Patient tells me that he uses it a few nights a week.  HTN/cardiomyopathy:  Not taking Bidil any more due to cost. Changed to Isorbide and Hydralazine.  He has been compliant with these medications and also with Norvasc and carvedilol. No CP or SOB at rest or exertion.  No HA or dizziness + edema in LT lower leg for mths.  Still takes Furosemide only on wkends because he does not he want to urinate frequently when he is at work  Asthma/COPD:  taking Adviair PRN.  Last used 2 wks ago.  Last used Albuterol las wk  Complains of some discomfort in the left knee and that the left knee feels weak.  He has not had any falls.  Also complains of left thumb locking up.  He is on Flexeril for his back.  He has been out of it.  He would like screening test for prostate cancer. Patient Active Problem  List   Diagnosis Date Noted  . Disturbance in sleep behavior 08/01/2017  . Hypertensive cardiomyopathy, without heart failure (HCC) 05/17/2016  . OSA (obstructive sleep apnea) 04/22/2016  . Exertional dyspnea 04/22/2016  . Obesity hypoventilation syndrome (HCC) 04/22/2016  . CKD (chronic kidney disease) stage 3, GFR 30-59 ml/min 02/02/2016  . Chronic combined systolic and diastolic heart failure, NYHA class 2 (HCC) 09/29/2015  . Chronic neck pain 09/21/2015  . Asthma   . Depression 07/10/2015  . Financial difficulties 04/30/2015  . Morbid obesity (HCC) 03/31/2015  . Chronic low back pain 03/31/2015  . OSA on CPAP 07/25/2014  . Essential hypertension   . Gout   . Seasonal allergies   . Chronic prostatitis   . Hip pain 01/11/2013  . Otitis media of left ear 01/11/2013  . Plantar wart of left foot 01/11/2013     Current Outpatient Medications on File Prior to Visit  Medication Sig Dispense Refill  . allopurinol (ZYLOPRIM) 300 MG tablet TAKE 1 TABLET (300 MG TOTAL) BY MOUTH DAILY. 30 tablet 5  . cetirizine (ZYRTEC) 10 MG tablet Take 1 tablet (10 mg total) by mouth daily. 30 tablet 3  . hydrALAZINE (APRESOLINE) 25 MG tablet Take 3 tablets (75 mg total) by mouth 3 (three) times daily. 270 tablet 2  . isosorbide dinitrate (ISORDIL) 20 MG tablet Take 2 tablets (40 mg total) by mouth 3 (three)  times daily. 180 tablet 2  . methocarbamol (ROBAXIN) 500 MG tablet Take 1 tablet (500 mg total) by mouth 2 (two) times daily. 20 tablet 0  . tamsulosin (FLOMAX) 0.4 MG CAPS capsule TAKE 1 CAPSULE BY MOUTH DAILY AFTER SUPPER. 30 capsule 6  . traMADol (ULTRAM) 50 MG tablet Take 1 tablet (50 mg total) by mouth every 8 (eight) hours as needed. 90 tablet 0   No current facility-administered medications on file prior to visit.     No Known Allergies  Social History   Socioeconomic History  . Marital status: Married    Spouse name: Not on file  . Number of children: Not on file  . Years of  education: Not on file  . Highest education level: Not on file  Occupational History  . Not on file  Social Needs  . Financial resource strain: Not on file  . Food insecurity    Worry: Not on file    Inability: Not on file  . Transportation needs    Medical: Not on file    Non-medical: Not on file  Tobacco Use  . Smoking status: Never Smoker  . Smokeless tobacco: Never Used  Substance and Sexual Activity  . Alcohol use: No  . Drug use: No  . Sexual activity: Not on file  Lifestyle  . Physical activity    Days per week: Not on file    Minutes per session: Not on file  . Stress: Not on file  Relationships  . Social Musicianconnections    Talks on phone: Not on file    Gets together: Not on file    Attends religious service: Not on file    Active member of club or organization: Not on file    Attends meetings of clubs or organizations: Not on file    Relationship status: Not on file  . Intimate partner violence    Fear of current or ex partner: Not on file    Emotionally abused: Not on file    Physically abused: Not on file    Forced sexual activity: Not on file  Other Topics Concern  . Not on file  Social History Narrative  . Not on file    Family History  Problem Relation Age of Onset  . Diabetes Father   . Hyperlipidemia Father     Past Surgical History:  Procedure Laterality Date  . ABDOMINAL AORTAGRAM N/A 07/28/2014   Procedure: ABDOMINAL Ronny FlurryAORTAGRAM;  Surgeon: Chuck Hinthristopher S Dickson, MD;  [facility];  Service: Cardiovascular;  Laterality: N/A;  . LOWER EXTREMITY ANGIOGRAM Bilateral 07/28/2014   Procedure: LOWER EXTREMITY ANGIOGRAM;  Surgeon: Chuck Hinthristopher S Dickson, MD;  [facility];  Service: Cardiovascular;  Laterality: Bilateral;  . THROMBECTOMY FEMORAL ARTERY Right 08/01/2014   Procedure: THROMBECTOMY RIGHT LEG SFA, Anterior tibial, and perineal arteries with intraoperative arteriograms;  Surgeon: Nada LibmanVance W Brabham, MD;  Location: [facility] OR;  Service:  Vascular;  Laterality: Right;    ROS: Review of Systems Negative except as stated above  PHYSICAL EXAM: BP 106/71 (BP Location: Left Arm, Patient Position: Sitting, Cuff Size: Large)   Pulse 87   Temp 98.7 F (37.1 C) (Oral)   Ht 5\' 7"  (1.702 m)   Wt (!) 320 lb 12.8 oz (145.5 kg)   SpO2 97%   BMI 50.24 kg/m   Physical Exam General appearance - alert, well appearing, and in no distress Mental status - normal mood, behavior, speech, dress, motor activity, and thought processes Eyes -  pupils equal and reactive, extraocular eye movements intact Mouth - mucous membranes moist, pharynx normal without lesions Neck - supple, no significant adenopathy Chest - clear to auscultation, no wheezes, rales or rhonchi, symmetric air entry Heart - normal rate, regular rhythm, normal S1, S2, no murmurs, rubs, clicks or gallops Musculoskeletal -left knee: Large body habitus.  No edema or erythema.  No point tenderness.  He seems to have good range of motion. Extremities -1-2+ edema in the left lower leg from mid shin down.  1+ edema in the right lower leg involving the lower one third of the shin  CMP Latest Ref Rng & Units 12/12/2018 06/26/2018 11/21/2017  Glucose 70 - 99 mg/dL 104(H) 77 113(H)  BUN 6 - 20 mg/dL 22(H) 39(H) 18  Creatinine 0.61 - 1.24 mg/dL 1.13 1.35(H) 1.83(H)  Sodium 135 - 145 mmol/L 140 148(H) 140  Potassium 3.5 - 5.1 mmol/L 3.6 4.6 3.4(L)  Chloride 98 - 111 mmol/L 105 101 102  CO2 22 - 32 mmol/L 27 22 27   Calcium 8.9 - 10.3 mg/dL 8.7(L) 9.0 8.9  Total Protein 6.0 - 8.5 g/dL - 7.2 -  Total Bilirubin 0.0 - 1.2 mg/dL - 0.6 -  Alkaline Phos 39 - 117 IU/L - 88 -  AST 0 - 40 IU/L - 13 -  ALT 0 - 44 IU/L - 12 -   Lipid Panel     Component Value Date/Time   CHOL 209 (H) 06/26/2018 1206   TRIG 93 06/26/2018 1206   HDL 82 06/26/2018 1206   CHOLHDL 2.5 06/26/2018 1206   CHOLHDL 3.1 03/31/2015 1148   VLDL 18 03/31/2015 1148   LDLCALC 108 (H) 06/26/2018 1206    CBC     Component Value Date/Time   WBC 9.2 12/12/2018 0654   RBC 4.15 (L) 12/12/2018 0654   HGB 11.9 (L) 12/12/2018 0654   HGB 13.6 06/26/2018 1206   HCT 39.4 12/12/2018 0654   HCT 41.8 06/26/2018 1206   PLT 173 12/12/2018 0654   PLT 210 06/26/2018 1206   MCV 94.9 12/12/2018 0654   MCV 89 06/26/2018 1206   MCH 28.7 12/12/2018 0654   MCHC 30.2 12/12/2018 0654   RDW 14.8 12/12/2018 0654   RDW 12.9 06/26/2018 1206   LYMPHSABS 1.6 12/12/2018 0654   LYMPHSABS 2.6 06/26/2018 1206   MONOABS 1.0 12/12/2018 0654   EOSABS 0.4 12/12/2018 0654   EOSABS 0.1 06/26/2018 1206   BASOSABS 0.0 12/12/2018 0654   BASOSABS 0.0 06/26/2018 1206    ASSESSMENT AND PLAN: 1. Preoperative evaluation of a medical condition to rule out surgical contraindications (TAR required) Given his history of cardiomyopathy, I will refer him to cardiology first before surgery - Ambulatory referral to Cardiology  2. Essential hypertension Blood pressure is much better today.  Advised to continue his current medications and low-salt diet - amLODipine (NORVASC) 5 MG tablet; TAKE 1 & 1/2 TABLETS (7.5 MG TOTAL) BY MOUTH ONCE A DAY  Dispense: 45 tablet; Refill: 3 - carvedilol (COREG) 12.5 MG tablet; Take 1 tablet (12.5 mg total) by mouth 2 (two) times daily with a meal.  Dispense: 60 tablet; Refill: 3 - furosemide (LASIX) 40 MG tablet; Take 1 tablet (40 mg total) by mouth daily.  Dispense: 30 tablet; Refill: 5 - Potassium Chloride ER 20 MEQ TBCR; Take 1 tablet by mouth daily.  Dispense: 30 tablet; Refill: 5 - Lipid panel - Comprehensive metabolic panel  3. Cardiomyopathy, hypertensive, with heart failure ([place]) Encouraged him to take the furosemide.  If lower extremity edema persists despite taking furosemide we may need to decrease the dose of the gabapentin as this too can cause lower extremity edema - Ambulatory referral to Cardiology  4. Chronic low back pain - cyclobenzaprine (FLEXERIL) 10 MG tablet; 1 tab PO BID prn   Dispense: 40 tablet; Refill: 1 - gabapentin (NEURONTIN) 300 MG capsule; Take 1 capsule (300 mg total) by mouth 3 (three) times daily.  Dispense: 90 capsule; Refill: 5  5. Mild intermittent asthma without complication Discontinue Advair since patient has not had to use this consistently.  Use albuterol as needed - albuterol (VENTOLIN HFA) 108 (90 Base) MCG/ACT inhaler; Inhale 2 puffs into the lungs every 6 (six) hours as needed for wheezing or shortness of breath.  Dispense: 8 g; Refill: 6  6. Trigger finger of left thumb Try Flexeril first.  If no improvement can refer to orthopedics  7. Chronic pain of left knee Recommend use of Voltaren gel as needed.  Weight loss encouraged. - DG Knee Complete 4 Views Left; Future  8. Depression, unspecified depression type Patient requested refill on Zoloft - sertraline (ZOLOFT) 50 MG tablet; Take 1 tablet (50 mg total) by mouth daily.  Dispense: 30 tablet; Refill: 0  9. Edema of left lower leg - VAS [place] LOWER EXTREMITY VENOUS (DVT); Future  10. Prostate cancer screening - PSA    Patient was given the opportunity to ask questions.  Patient verbalized understanding of the plan and was able to repeat key elements of the plan.   Orders Placed This Encounter  Procedures  . DG Knee Complete 4 Views Left  . Lipid panel  . PSA  . Comprehensive metabolic panel  . Ambulatory referral to Cardiology  . VAS [place] LOWER EXTREMITY VENOUS (DVT)     Requested Prescriptions   Signed Prescriptions Disp Refills  . amLODipine (NORVASC) 5 MG tablet 45 tablet 3    Sig: TAKE 1 & 1/2 TABLETS (7.5 MG TOTAL) BY MOUTH ONCE A DAY  . carvedilol (COREG) 12.5 MG tablet 60 tablet 3    Sig: Take 1 tablet (12.5 mg total) by mouth 2 (two) times daily with a meal.  . cyclobenzaprine (FLEXERIL) 10 MG tablet 40 tablet 1    Sig: 1 tab PO BID prn  . Diclofenac Sodium 3 % GEL 100 g 0    Sig: Apply 2-4 times daily to LT knee as needed  . furosemide (LASIX) 40 MG tablet 30  tablet 5    Sig: Take 1 tablet (40 mg total) by mouth daily.  Marland Kitchen gabapentin (NEURONTIN) 300 MG capsule 90 capsule 5    Sig: Take 1 capsule (300 mg total) by mouth 3 (three) times daily.  . Potassium Chloride ER 20 MEQ TBCR 30 tablet 5    Sig: Take 1 tablet by mouth daily.  . sertraline (ZOLOFT) 50 MG tablet 30 tablet 0    Sig: Take 1 tablet (50 mg total) by mouth daily.  Marland Kitchen albuterol (VENTOLIN HFA) 108 (90 Base) MCG/ACT inhaler 8 g 6    Sig: Inhale 2 puffs into the lungs every 6 (six) hours as needed for wheezing or shortness of breath.    Return in about 7 weeks (around 06/03/2019).  Jonah Blue, MD, FACP

## 2019-04-16 ENCOUNTER — Ambulatory Visit (HOSPITAL_COMMUNITY)
Admission: RE | Admit: 2019-04-16 | Discharge: 2019-04-16 | Disposition: A | Discharge status: Home / Self Care | Payer: BC Managed Care – PPO | Source: Ambulatory Visit | Attending: Internal Medicine | Admitting: Internal Medicine

## 2019-04-16 DIAGNOSIS — G8929 Other chronic pain: Secondary | ICD-10-CM | POA: Insufficient documentation

## 2019-04-16 DIAGNOSIS — M25462 Effusion, left knee: Secondary | ICD-10-CM | POA: Diagnosis not present

## 2019-04-16 DIAGNOSIS — M25562 Pain in left knee: Secondary | ICD-10-CM | POA: Insufficient documentation

## 2019-04-16 DIAGNOSIS — M1712 Unilateral primary osteoarthritis, left knee: Secondary | ICD-10-CM | POA: Diagnosis not present

## 2019-04-16 LAB — COMPREHENSIVE METABOLIC PANEL
ALT: 17 IU/L (ref 0–44)
AST: 23 IU/L (ref 0–40)
Albumin/Globulin Ratio: 1.3 (ref 1.2–2.2)
Albumin: 4.2 g/dL (ref 3.8–4.9)
Alkaline Phosphatase: 109 IU/L (ref 39–117)
BUN/Creatinine Ratio: 18 (ref 9–20)
BUN: 33 mg/dL — ABNORMAL HIGH (ref 6–24)
Bilirubin Total: 0.4 mg/dL (ref 0.0–1.2)
CO2: 28 mmol/L (ref 20–29)
Calcium: 9.5 mg/dL (ref 8.7–10.2)
Chloride: 100 mmol/L (ref 96–106)
Creatinine, Ser: 1.8 mg/dL — ABNORMAL HIGH (ref 0.76–1.27)
GFR calc Af Amer: 49 mL/min/{1.73_m2} — ABNORMAL LOW (ref >59)
GFR calc non Af Amer: 42 mL/min/{1.73_m2} — ABNORMAL LOW (ref >59)
Globulin, Total: 3.3 g/dL (ref 1.5–4.5)
Glucose: 100 mg/dL — ABNORMAL HIGH (ref 65–99)
Potassium: 3.7 mmol/L (ref 3.5–5.2)
Sodium: 144 mmol/L (ref 134–144)
Total Protein: 7.5 g/dL (ref 6.0–8.5)

## 2019-04-16 LAB — LIPID PANEL
Chol/HDL Ratio: 2.8 ratio (ref 0.0–5.0)
Cholesterol, Total: 182 mg/dL (ref 100–199)
HDL: 66 mg/dL (ref >39)
LDL Chol Calc (NIH): 92 mg/dL (ref 0–99)
Triglycerides: 137 mg/dL (ref 0–149)
VLDL Cholesterol Cal: 24 mg/dL (ref 5–40)

## 2019-04-16 LAB — PSA: Prostate Specific Ag, Serum: 0.9 ng/mL (ref 0.0–4.0)

## 2019-04-16 MED FILL — POTASSIUM CL ER 20 MEQ TAB: 20 | 30 days supply | Qty: 30 | Fill #0

## 2019-04-16 MED FILL — ALBUTEROL SULFATE HFA 108 (: 108 (90 BAS | 25 days supply | Qty: 18 | Fill #0

## 2019-04-17 ENCOUNTER — Telehealth: Payer: Self-pay | Admitting: Internal Medicine

## 2019-04-17 DIAGNOSIS — N289 Disorder of kidney and ureter, unspecified: Secondary | ICD-10-CM

## 2019-04-17 NOTE — Telephone Encounter (Signed)
PC placed to pt this a.m to discuss lab results.  I got his VM.  I did not leave a message.  I will have my CMA try to reach him later with his lab results and result of x-ray of LT knee.  Results for orders placed or performed in visit on 04/15/19  Lipid panel  Result Value Ref Range   Cholesterol, Total 182 100 - 199 mg/dL   Triglycerides 137 0 - 149 mg/dL   HDL 66 >39 mg/dL   VLDL Cholesterol Cal 24 5 - 40 mg/dL   LDL Chol Calc (NIH) 92 0 - 99 mg/dL   Chol/HDL Ratio 2.8 0.0 - 5.0 ratio  PSA  Result Value Ref Range   Prostate Specific Ag, Serum 0.9 0.0 - 4.0 ng/mL  Comprehensive metabolic panel  Result Value Ref Range   Glucose 100 (H) 65 - 99 mg/dL   BUN 33 (H) 6 - 24 mg/dL   Creatinine, Ser 1.80 (H) 0.76 - 1.27 mg/dL   GFR calc non Af Amer 42 (L) >59 mL/min/1.73   GFR calc Af Amer 49 (L) >59 mL/min/1.73   BUN/Creatinine Ratio 18 9 - 20   Sodium 144 134 - 144 mmol/L   Potassium 3.7 3.5 - 5.2 mmol/L   Chloride 100 96 - 106 mmol/L   CO2 28 20 - 29 mmol/L   Calcium 9.5 8.7 - 10.2 mg/dL   Total Protein 7.5 6.0 - 8.5 g/dL   Albumin 4.2 3.8 - 4.9 g/dL   Globulin, Total 3.3 1.5 - 4.5 g/dL   Albumin/Globulin Ratio 1.3 1.2 - 2.2   Bilirubin Total 0.4 0.0 - 1.2 mg/dL   Alkaline Phosphatase 109 39 - 117 IU/L   AST 23 0 - 40 IU/L   ALT 17 0 - 44 IU/L

## 2019-04-18 ENCOUNTER — Ambulatory Visit (HOSPITAL_COMMUNITY)
Admission: RE | Admit: 2019-04-18 | Discharge: 2019-04-18 | Disposition: A | Discharge status: Home / Self Care | Payer: BC Managed Care – PPO | Source: Ambulatory Visit | Attending: Internal Medicine | Admitting: Internal Medicine

## 2019-04-18 ENCOUNTER — Other Ambulatory Visit: Payer: Self-pay

## 2019-04-18 ENCOUNTER — Telehealth: Payer: Self-pay

## 2019-04-18 DIAGNOSIS — R600 Localized edema: Secondary | ICD-10-CM

## 2019-04-18 NOTE — Progress Notes (Signed)
LLE venous duplex       has been completed. Preliminary results can be found under CV proc through chart review. June Leap, BS, RDMS, RVT   Attempted call report to [facility]. No answer after waiting on hold. Will let patient leave.

## 2019-04-18 NOTE — Telephone Encounter (Signed)
Contacted pt to go over lab results pt didn't answer lvm asking pt to give me a call at his earliest convenience  

## 2019-04-22 ENCOUNTER — Telehealth: Payer: Self-pay | Admitting: Internal Medicine

## 2019-04-22 NOTE — Telephone Encounter (Signed)
Patient returned call regarding lab results. Please f/u  

## 2019-04-23 NOTE — Telephone Encounter (Signed)
Returned pt call to go over lab results pt didn't answer lvm informing pt this is the second attempt to contact him and I will be mailing a letter out per provider request and if he has any questions or concerns to give me a call

## 2019-04-23 NOTE — Telephone Encounter (Signed)
Patient was informed of letter and numerous attempts to be reached informed that a letter will also be sent out. Please follow up.

## 2019-04-26 MED FILL — GABAPENTIN 300 MG CAPSULE: 300 | 30 days supply | Qty: 90 | Fill #0

## 2019-05-01 ENCOUNTER — Telehealth: Payer: Self-pay | Admitting: Internal Medicine

## 2019-05-01 NOTE — Telephone Encounter (Signed)
Patient called requesting his x-ray results. Please f/u

## 2019-05-06 MED FILL — CYCLOBENZAPRINE 10 MG TAB: 10 | 20 days supply | Qty: 40 | Fill #0

## 2019-05-06 MED FILL — ISOSORBIDE DN 20 MG TABLET: 20 | 30 days supply | Qty: 180 | Fill #2

## 2019-05-06 MED FILL — AMLODIPINE BESYLATE 5 MG TA: 5 | 90 days supply | Qty: 45 | Fill #0

## 2019-05-06 MED FILL — ALLOPURINOL 300 MG TAB: 300 | 30 days supply | Qty: 30 | Fill #3

## 2019-05-06 MED FILL — hydrALAZINE HCL 25 MG TABS: 25 | 30 days supply | Qty: 270 | Fill #2

## 2019-05-06 MED FILL — CARVEDILOL 12.5 MG TABLET: 12.5 | 30 days supply | Qty: 60 | Fill #0

## 2019-05-13 NOTE — Progress Notes (Signed)
Cardiology Office Note   Date:  05/15/2019   ID:  Rickey Calderon, DOB [birth year], MRN [id number]  PCP:  Ladell Pier, MD  Cardiologist:   No primary care provider on file.   Chief Complaint  Patient presents with  . Cardiomyopathy      History of Present Illness: Rickey Calderon is a 53 y.o. male who presents for evaluation of cardiomyopathy.  I have not seen him since 2018.  He has had heart failure with an ejection fraction of 25-30%.   Follow up echo in Nov 2017 demonstrated an EF of 50%.  He returns for follow up and was thinking of having a back surgery.  This is a preop visit.  The patient denies any new symptoms such as chest discomfort, neck or arm discomfort. There has been no new shortness of breath, PND or orthopnea. There have been no reported palpitations, presyncope or syncope.  He is limited by some back pain.     Past Medical History:  Diagnosis Date  . Asthma   . Bronchitis   . CHF (congestive heart failure) (HCC)    EF 30% by echo,  EF 50% Nov 2017  . Depression   . 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 Gout   . Hypertension   . Morbid obesity ([place])   . Prostatitis   . Seasonal allergies   . Sleep apnea    BiPAP    Past Surgical History:  Procedure Laterality Date  . ABDOMINAL AORTAGRAM N/A 07/28/2014   Procedure: ABDOMINAL Maxcine Ham;  Surgeon: Angelia Mould, MD;  [facility] C[facility] LAB;  Service: Cardiovascular;  Laterality: N/A;  . LOWER EXTREMITY ANGIOGRAM Bilateral 07/28/2014   Procedure: LOWER EXTREMITY ANGIOGRAM;  Surgeon: Angelia Mould, MD;  Location: [facility] CATH LAB;  Service: Cardiovascular;  Laterality: Bilateral;  . THROMBECTOMY FEMORAL ARTERY Right 08/01/2014   Procedure: THROMBECTOMY RIGHT LEG SFA, Anterior tibial, and perineal arteries with intraoperative arteriograms;  Surgeon: Serafina Mitchell, MD;  Location: [facility] OR;  Service: Vascular;  Laterality: Right;     Current Outpatient Medications  Medication Sig Dispense Refill  .  albuterol (VENTOLIN HFA) 108 (90 Base) MCG/ACT inhaler Inhale 2 puffs into the lungs every 6 (six) hours as needed for wheezing or shortness of breath. 8 g 6  . allopurinol (ZYLOPRIM) 300 MG tablet TAKE 1 TABLET (300 MG TOTAL) BY MOUTH DAILY. 30 tablet 5  . amLODipine (NORVASC) 5 MG tablet TAKE 1 & 1/2 TABLETS (7.5 MG TOTAL) BY MOUTH ONCE A DAY 45 tablet 3  . carvedilol (COREG) 12.5 MG tablet Take 1 tablet (12.5 mg total) by mouth 2 (two) times daily with a meal. 60 tablet 3  . cetirizine (ZYRTEC) 10 MG tablet Take 1 tablet (10 mg total) by mouth daily. 30 tablet 3  . cyclobenzaprine (FLEXERIL) 10 MG tablet 1 tab PO BID prn 40 tablet 1  . Diclofenac Sodium 3 % GEL Apply 2-4 times daily to LT knee as needed 100 g 0  . furosemide (LASIX) 40 MG tablet Take 1 tablet (40 mg total) by mouth daily. 30 tablet 5  . gabapentin (NEURONTIN) 300 MG capsule Take 1 capsule (300 mg total) by mouth 3 (three) times daily. 90 capsule 5  . hydrALAZINE (APRESOLINE) 25 MG tablet Take 3 tablets (75 mg total) by mouth 3 (three) times daily. 270 tablet 2  . isosorbide dinitrate (ISORDIL) 20 MG tablet Take 2 tablets (40 mg total) by mouth 3 (three) times daily. 180 tablet 2  .  methocarbamol (ROBAXIN) 500 MG tablet Take 1 tablet (500 mg total) by mouth 2 (two) times daily. 20 tablet 0  . Potassium Chloride ER 20 MEQ TBCR Take 1 tablet by mouth daily. 30 tablet 5  . sertraline (ZOLOFT) 50 MG tablet Take 1 tablet (50 mg total) by mouth daily. 30 tablet 0  . tamsulosin (FLOMAX) 0.4 MG CAPS capsule TAKE 1 CAPSULE BY MOUTH DAILY AFTER SUPPER. 30 capsule 6  . traMADol (ULTRAM) 50 MG tablet Take 1 tablet (50 mg total) by mouth every 8 (eight) hours as needed. 90 tablet 0   No current facility-administered medications for this visit.     Allergies:   Patient has no known allergies.    Social History:  The patient  reports that he has never smoked. He has never used smokeless tobacco. He reports that he does not drink alcohol or  use drugs.   Family History:  The patient's family history includes Diabetes in his father; Hyperlipidemia in his father.    ROS:  Please see the history of present illness.   Otherwise, review of systems are positive for none.   All other systems are reviewed and negative.    PHYSICAL EXAM: VS:  BP 116/88   Pulse 82   Ht 5\' 7"  (1.702 m)   Wt (!) 322 lb (146.1 kg)   BMI 50.43 kg/m  , BMI Body mass index is 50.43 kg/m. GENERAL:  Well appearing HEENT:  Pupils equal round and reactive, fundi not visualized, oral mucosa unremarkable NECK:  No jugular venous distention, waveform within normal limits, carotid upstroke brisk and symmetric, no bruits, no thyromegaly LYMPHATICS:  No cervical, inguinal adenopathy LUNGS:  Clear to auscultation bilaterally BACK:  No CVA tenderness CHEST:  Unremarkable HEART:  PMI not displaced or sustained,S1 and S2 within normal limits, no S3, no S4, no clicks, no rubs, no murmurs ABD:  Flat, positive bowel sounds normal in frequency in pitch, no bruits, no rebound, no guarding, no midline pulsatile mass, no hepatomegaly, no splenomegaly EXT:  2 plus pulses throughout, mild bilateral lower leg edema, no cyanosis no clubbing SKIN:  No rashes no nodules NEURO:  Cranial nerves II through XII grossly intact, motor grossly intact throughout PSYCH:  Cognitively intact, oriented to person place and time    EKG:  EKG is ordered today. The ekg ordered today demonstrates Sinus rhythm, rate 82, axis within normal limits, intervals within normal limits, no acute ST-T wave changes.   Recent Labs: 12/12/2018: Hemoglobin 11.9; Platelets 173 04/15/2019: ALT 17; BUN 33; Creatinine, Ser 1.80; Potassium 3.7; Sodium 144    Lipid Panel    Component Value Date/Time   CHOL 182 04/15/2019 1634   TRIG 137 04/15/2019 1634   HDL 66 04/15/2019 1634   CHOLHDL 2.8 04/15/2019 1634   CHOLHDL 3.1 03/31/2015 1148   VLDL 18 03/31/2015 1148   LDLCALC 92 04/15/2019 1634      Wt  Readings from Last 3 Encounters:  05/14/19 (!) 322 lb (146.1 kg)  04/15/19 (!) 320 lb 12.8 oz (145.5 kg)  01/28/19 (!) 316 lb (143.3 kg)      Other studies Reviewed: Additional studies/ records that were reviewed today include: None. Review of the above records demonstrates:  Please see elsewhere in the note.     ASSESSMENT AND PLAN:  CARDIOMYOPATHY:    I will check an echo.  I suspect the EF is OK.  No change in therapy.   HTN:  This is controlled.  No change  in therapy.    SLEEP APNEA:   He is on BiPAP.     OBESITY:    We talked about specifics around weight loss.   PREOP:  He is not considering surgery at this time so I will not specifically address this with an open ended approval.  I  Current medicines are reviewed at length with the patient today.  The patient does not have concerns regarding medicines.  The following changes have been made:  no change  Labs/ tests ordered today include:   Orders Placed This Encounter  Procedures  . EKG 12-Lead  . ECHOCARDIOGRAM COMPLETE     Disposition:   FU with APP in one year.     Signed, Rollene RotundaJames Latorie Montesano, MD  05/15/2019 8:15 AM    [facility]

## 2019-05-14 ENCOUNTER — Encounter: Payer: Self-pay | Admitting: Cardiology

## 2019-05-14 ENCOUNTER — Ambulatory Visit: Payer: BC Managed Care – PPO | Admitting: Cardiology

## 2019-05-14 ENCOUNTER — Other Ambulatory Visit: Payer: Self-pay

## 2019-05-14 VITALS — BP 116/88 | HR 82 | Ht 67.0 in | Wt 322.0 lb

## 2019-05-14 DIAGNOSIS — I429 Cardiomyopathy, unspecified: Secondary | ICD-10-CM | POA: Diagnosis not present

## 2019-05-14 DIAGNOSIS — G4730 Sleep apnea, unspecified: Secondary | ICD-10-CM

## 2019-05-14 DIAGNOSIS — I10 Essential (primary) hypertension: Secondary | ICD-10-CM

## 2019-05-14 NOTE — Patient Instructions (Signed)
Medication Instructions:  Your physician recommends that you continue on your current medications as directed. Please refer to the Current Medication list given to you today.  If you need a refill on your cardiac medications before your next appointment, please call your pharmacy.   Lab work: NONE  Testing/Procedures: Your physician has requested that you have an echocardiogram. Echocardiography is a painless test that uses sound waves to create images of your heart. It provides your doctor with information about the size and shape of your heart and how well your heart's chambers and valves are working. This procedure takes approximately one hour. There are no restrictions for this procedure. [place] [address]  Follow-Up: At [facility], you and your health needs are our priority.  As part of our continuing mission to provide you with exceptional heart care, we have created designated Provider Care Teams.  These Care Teams include your primary Cardiologist (physician) and Advanced Practice Providers (APPs -  Physician Assistants and Nurse Practitioners) who all work together to provide you with the care you need, when you need it. You may see Dr Percival Spanish or one of the following Advanced Practice Providers on your designated Care Team:    Rosaria Ferries, PA-C  Jory Sims, DNP, ANP  Cadence Kathlen Mody, NP   Your physician wants you to follow-up in: 1 year with a physicians assistant. You will receive a reminder letter in the mail two months in advance. If you don't receive a letter, please call our office to schedule the follow-up appointment.

## 2019-05-15 ENCOUNTER — Encounter: Payer: Self-pay | Admitting: Cardiology

## 2019-05-15 ENCOUNTER — Telehealth: Payer: Self-pay | Admitting: Cardiology

## 2019-05-15 NOTE — Telephone Encounter (Signed)
Left message for patient to call and schedule ECHO ordered by Dr. Percival Spanish

## 2019-05-20 DIAGNOSIS — M4726 Other spondylosis with radiculopathy, lumbar region: Secondary | ICD-10-CM | POA: Diagnosis not present

## 2019-05-20 DIAGNOSIS — M5137 Other intervertebral disc degeneration, lumbosacral region: Secondary | ICD-10-CM | POA: Diagnosis not present

## 2019-05-20 DIAGNOSIS — M5136 Other intervertebral disc degeneration, lumbar region: Secondary | ICD-10-CM | POA: Diagnosis not present

## 2019-05-20 DIAGNOSIS — M48062 Spinal stenosis, lumbar region with neurogenic claudication: Secondary | ICD-10-CM | POA: Diagnosis not present

## 2019-05-30 ENCOUNTER — Other Ambulatory Visit (HOSPITAL_COMMUNITY): Payer: BC Managed Care – PPO

## 2019-06-03 ENCOUNTER — Ambulatory Visit: Payer: BC Managed Care – PPO | Attending: Internal Medicine | Admitting: Internal Medicine

## 2019-06-03 ENCOUNTER — Other Ambulatory Visit: Payer: Self-pay | Admitting: Internal Medicine

## 2019-06-03 DIAGNOSIS — M1712 Unilateral primary osteoarthritis, left knee: Secondary | ICD-10-CM

## 2019-06-03 DIAGNOSIS — F32A Depression, unspecified: Secondary | ICD-10-CM

## 2019-06-03 DIAGNOSIS — F329 Major depressive disorder, single episode, unspecified: Secondary | ICD-10-CM

## 2019-06-03 DIAGNOSIS — I10 Essential (primary) hypertension: Secondary | ICD-10-CM | POA: Diagnosis not present

## 2019-06-03 DIAGNOSIS — I429 Cardiomyopathy, unspecified: Secondary | ICD-10-CM

## 2019-06-03 DIAGNOSIS — N1831 Chronic kidney disease, stage 3a: Secondary | ICD-10-CM | POA: Diagnosis not present

## 2019-06-03 MED ORDER — HYDRALAZINE HCL 25 MG PO TABS: 75.0000 mg | ORAL_TABLET | Freq: Three times a day (TID) | ORAL | 2 refills | Status: DC

## 2019-06-03 MED ORDER — SERTRALINE HCL 50 MG PO TABS: 50.0000 mg | ORAL_TABLET | Freq: Every day | ORAL | 5 refills | Status: DC

## 2019-06-03 MED ORDER — AMLODIPINE BESYLATE 5 MG PO TABS: ORAL_TABLET | ORAL | 3 refills | Status: DC

## 2019-06-03 MED ORDER — TRAMADOL HCL 50 MG PO TABS: 50.0000 mg | ORAL_TABLET | Freq: Three times a day (TID) | ORAL | 0 refills | Status: DC | PRN

## 2019-06-03 MED ORDER — ISOSORBIDE DINITRATE 20 MG PO TABS: 40.0000 mg | ORAL_TABLET | Freq: Three times a day (TID) | ORAL | 6 refills | Status: DC

## 2019-06-03 MED FILL — traMADol HCL 50 MG TABS: 50 | 7 days supply | Qty: 21 | Fill #3

## 2019-06-03 MED FILL — CYCLOBENZAPRINE 10 MG TAB: 10 | 20 days supply | Qty: 40 | Fill #1

## 2019-06-03 MED FILL — SERTRALINE HCL 50 MG TABS: 50 | 30 days supply | Qty: 30 | Fill #0

## 2019-06-03 MED FILL — GABAPENTIN 300 MG CAPSULE: 300 | 30 days supply | Qty: 90 | Fill #1

## 2019-06-03 NOTE — Progress Notes (Signed)
Virtual Visit via Telephone Note Due to current restrictions/limitations of in-office visits due to the COVID-19 pandemic, this scheduled clinical appointment was converted to a telehealth visit  I connected with Rickey Calderon on 06/03/19 at 4:31 p.m by telephone and verified that I am speaking with the correct person using two identifiers. I am in my office.  The patient is at home.  Only the patient and myself participated in this encounter.  I discussed the limitations, risks, security and privacy concerns of performing an evaluation and management service by telephone and the availability of in person appointments. I also discussed with the patient that there may be a patient responsible charge related to this service. The patient expressed understanding and agreed to proceed.   History of Present Illness: Pt with hx of HTN, OSA on BiPAP, CKD2- 3, gout, asthma (mild intermittent)and mild COPD (PFTs 04/2016), chronic LBP (on Tylenol #3, Flexeril and Gabapentin), obesity, depression,cardiomyopathy with EF inc from 25% to 45-50% 04/2016 (thought to be due to HTN. 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  Patient was last seen in October.  Purpose of today's visit is follow-up post cardiology evaluation for preoperative eval   Cardiomyopathy:  Saw Dr. Percival Spanish since last visit.  Echo ordered. -decided to try the ESI to his back first before deciding on having surgery.  Scheduled for ESI later this mth -started taking the Furosemide 4 days a wk. reports her lower extremity edema has decreased significant amount  CKD: eGFR dec from 60 to 49 on blood test done on last visit.Marland Kitchen  He tells me that he has been taking Aleve 2-3 x a day for his back pain and left knee pain.  Last prescription for tramadol was given in July of this year.  He tells me that his insurance only allows fill for 21  pills at a time.  LT knee pain: X-ray revealed moderate tricompartment OA changes.  He reports the knee is very bothersome to him especially when he is working.  Needing refill on some of his blood pressure medications and Zoloft. Outpatient Encounter Medications as of 06/03/2019  Medication Sig  . albuterol (VENTOLIN HFA) 108 (90 Base) MCG/ACT inhaler Inhale 2 puffs into the lungs every 6 (six) hours as needed for wheezing or shortness of breath.  . allopurinol (ZYLOPRIM) 300 MG tablet TAKE 1 TABLET (300 MG TOTAL) BY MOUTH DAILY.  Marland Kitchen amLODipine (NORVASC) 5 MG tablet TAKE 1 & 1/2 TABLETS (7.5 MG TOTAL) BY MOUTH ONCE A DAY  . carvedilol (COREG) 12.5 MG tablet Take 1 tablet (12.5 mg total) by mouth 2 (two) times daily with a meal.  . cetirizine (ZYRTEC) 10 MG tablet Take 1 tablet (10 mg total) by mouth daily.  . cyclobenzaprine (FLEXERIL) 10 MG tablet 1 tab PO BID prn  . Diclofenac Sodium 3 % GEL Apply 2-4 times daily to LT knee as needed  . furosemide (LASIX) 40 MG tablet Take 1 tablet (40 mg total) by mouth daily.  Marland Kitchen gabapentin (NEURONTIN) 300 MG capsule Take 1 capsule (300 mg total) by mouth 3 (three) times daily.  . hydrALAZINE (APRESOLINE) 25 MG tablet Take 3 tablets (75 mg total) by mouth 3 (three) times daily.  . isosorbide dinitrate (ISORDIL) 20 MG tablet Take 2 tablets (40 mg total) by mouth 3 (three) times daily.  . methocarbamol (ROBAXIN) 500 MG tablet Take 1 tablet (500 mg total) by mouth  2 (two) times daily.  . Potassium Chloride ER 20 MEQ TBCR Take 1 tablet by mouth daily.  . sertraline (ZOLOFT) 50 MG tablet Take 1 tablet (50 mg total) by mouth daily.  . tamsulosin (FLOMAX) 0.4 MG CAPS capsule TAKE 1 CAPSULE BY MOUTH DAILY AFTER SUPPER.  . traMADol (ULTRAM) 50 MG tablet Take 1 tablet (50 mg total) by mouth every 8 (eight) hours as needed.   No facility-administered encounter medications on file as of 06/03/2019.     Observations/Objective: Results for orders placed or performed  in visit on 04/15/19  Lipid panel  Result Value Ref Range   Cholesterol, Total 182 100 - 199 mg/dL   Triglycerides 137 0 - 149 mg/dL   HDL 66 >39 mg/dL   VLDL Cholesterol Cal 24 5 - 40 mg/dL   LDL Chol Calc (NIH) 92 0 - 99 mg/dL   Chol/HDL Ratio 2.8 0.0 - 5.0 ratio  PSA  Result Value Ref Range   Prostate Specific Ag, Serum 0.9 0.0 - 4.0 ng/mL  Comprehensive metabolic panel  Result Value Ref Range   Glucose 100 (H) 65 - 99 mg/dL   BUN 33 (H) 6 - 24 mg/dL   Creatinine, Ser 1.80 (H) 0.76 - 1.27 mg/dL   GFR calc non Af Amer 42 (L) >59 mL/min/1.73   GFR calc Af Amer 49 (L) >59 mL/min/1.73   BUN/Creatinine Ratio 18 9 - 20   Sodium 144 134 - 144 mmol/L   Potassium 3.7 3.5 - 5.2 mmol/L   Chloride 100 96 - 106 mmol/L   CO2 28 20 - 29 mmol/L   Calcium 9.5 8.7 - 10.2 mg/dL   Total Protein 7.5 6.0 - 8.5 g/dL   Albumin 4.2 3.8 - 4.9 g/dL   Globulin, Total 3.3 1.5 - 4.5 g/dL   Albumin/Globulin Ratio 1.3 1.2 - 2.2   Bilirubin Total 0.4 0.0 - 1.2 mg/dL   Alkaline Phosphatase 109 39 - 117 IU/L   AST 23 0 - 40 IU/L   ALT 17 0 - 44 IU/L     Assessment and Plan: 1. Cardiomyopathy, unspecified type [facility]) He has seen the cardiologist.  Echo scheduled for later this month. Patient does not plan to have back surgery as yet.  He is trying nonoperative measures first. - isosorbide dinitrate (ISORDIL) 20 MG tablet; Take 2 tablets (40 mg total) by mouth 3 (three) times daily.  Dispense: 180 tablet; Refill: 6  2. Primary osteoarthritis of left knee Advised that he stop Aleve and all NSAIDs as these will make his kidney function worse.  We will use the tramadol instead.  Controlled substance prescribing agreement was updated 12/2018.  I have reviewed the [place] controlled substance reporting system. Stressed importance of weight loss through better eating habits.  Consider referral to orthopedics once he is gotten his back issue under control - traMADol (ULTRAM) 50 MG tablet; Take 1 tablet (50  mg total) by mouth every 8 (eight) hours as needed.  Dispense: 90 tablet; Refill: 0  3. Stage 3a chronic kidney disease Advised to stop all NSAIDs.  Return to the lab in about 2 to 3 weeks for recheck of kidney function.  4. Depression, unspecified depression type - sertraline (ZOLOFT) 50 MG tablet; Take 1 tablet (50 mg total) by mouth daily.  Dispense: 30 tablet; Refill: 5  5. Essential hypertension - isosorbide dinitrate (ISORDIL) 20 MG tablet; Take 2 tablets (40 mg total) by mouth 3 (three) times daily.  Dispense: 180 tablet; Refill: 6 -  amLODipine (NORVASC) 5 MG tablet; TAKE 1 & 1/2 TABLETS (7.5 MG TOTAL) BY MOUTH ONCE A DAY  Dispense: 45 tablet; Refill: 3   Follow Up Instructions: 3 mths   I discussed the assessment and treatment plan with the patient. The patient was provided an opportunity to ask questions and all were answered. The patient agreed with the plan and demonstrated an understanding of the instructions.   The patient was advised to call back or seek an in-person evaluation if the symptoms worsen or if the condition fails to improve as anticipated.  I provided 13 minutes of non-face-to-face time during this encounter.   Karle Plumber, MD

## 2019-06-04 ENCOUNTER — Telehealth: Payer: Self-pay

## 2019-06-04 MED FILL — ISOSORBIDE DINITRATE 20 MG: 20 | 30 days supply | Qty: 180 | Fill #0

## 2019-06-04 MED FILL — hydrALAZINE HCL 25 MG TABS: 25 | 30 days supply | Qty: 270 | Fill #0

## 2019-06-04 NOTE — Telephone Encounter (Signed)
Contacted pt to schedule 3 month f/u pt didn't answer lvm asking pt to give a call back to schedule  

## 2019-06-06 MED FILL — ALLOPURINOL 300 MG TAB: 300 | 30 days supply | Qty: 30 | Fill #4

## 2019-06-06 MED FILL — CARVEDILOL 12.5 MG TABLET: 12.5 | 30 days supply | Qty: 60 | Fill #1

## 2019-06-12 MED FILL — traMADol HCL 50 MG TABS: 50 | 30 days supply | Qty: 90 | Fill #0

## 2019-06-14 DIAGNOSIS — M48062 Spinal stenosis, lumbar region with neurogenic claudication: Secondary | ICD-10-CM | POA: Diagnosis not present

## 2019-06-14 DIAGNOSIS — Z6841 Body Mass Index (BMI) 40.0 and over, adult: Secondary | ICD-10-CM | POA: Diagnosis not present

## 2019-06-14 DIAGNOSIS — M5416 Radiculopathy, lumbar region: Secondary | ICD-10-CM | POA: Diagnosis not present

## 2019-06-14 DIAGNOSIS — I10 Essential (primary) hypertension: Secondary | ICD-10-CM | POA: Diagnosis not present

## 2019-06-18 ENCOUNTER — Other Ambulatory Visit: Payer: Self-pay

## 2019-06-18 ENCOUNTER — Ambulatory Visit (HOSPITAL_COMMUNITY): Payer: BC Managed Care – PPO | Attending: Cardiology

## 2019-06-18 DIAGNOSIS — I429 Cardiomyopathy, unspecified: Secondary | ICD-10-CM | POA: Insufficient documentation

## 2019-07-12 ENCOUNTER — Other Ambulatory Visit: Payer: Self-pay | Admitting: Internal Medicine

## 2019-07-12 DIAGNOSIS — M1712 Unilateral primary osteoarthritis, left knee: Secondary | ICD-10-CM

## 2019-07-12 DIAGNOSIS — G8929 Other chronic pain: Secondary | ICD-10-CM

## 2019-07-12 MED FILL — SERTRALINE HCL 50 MG TABS: 50 | 30 days supply | Qty: 30 | Fill #0

## 2019-07-12 MED FILL — hydrALAZINE HCL 25 MG TABS: 25 | 30 days supply | Qty: 270 | Fill #1

## 2019-07-12 MED FILL — ALLOPURINOL 300 MG TAB: 300 | 30 days supply | Qty: 30 | Fill #5

## 2019-07-12 MED FILL — CYCLOBENZAPRINE 10 MG TAB: 10 | 20 days supply | Qty: 40 | Fill #0

## 2019-07-12 MED FILL — GABAPENTIN 300 MG CAPSULE: 300 | 30 days supply | Qty: 90 | Fill #2

## 2019-07-12 MED FILL — CARVEDILOL 12.5 MG TABLET: 12.5 | 30 days supply | Qty: 60 | Fill #2

## 2019-07-12 MED FILL — ISOSORBIDE DN 20 MG TABLET: 20 | 30 days supply | Qty: 180 | Fill #1

## 2019-07-15 MED FILL — AMLODIPINE BESYLATE 5 MG TA: 5 | 30 days supply | Qty: 45 | Fill #1

## 2019-07-15 MED FILL — traMADol HCL 50 MG TABS: 50 | 30 days supply | Qty: 90 | Fill #0

## 2019-07-23 MED FILL — ISOSORBIDE DN 20 MG TABLET: 20 | 30 days supply | Qty: 180 | Fill #1

## 2019-07-26 ENCOUNTER — Telehealth: Payer: Self-pay | Admitting: Internal Medicine

## 2019-07-26 DIAGNOSIS — M1712 Unilateral primary osteoarthritis, left knee: Secondary | ICD-10-CM

## 2019-07-26 NOTE — Telephone Encounter (Signed)
Returned pt call to get more information. Pt is requesting a referral to ortho for left knee pain. Pt states it is getting to the point to where it is hard for him to walk.

## 2019-07-26 NOTE — Telephone Encounter (Signed)
Patient calling in regards to arthritis. Would like to try injections due to pain and aching.   Please contact patient at [phone].

## 2019-08-07 ENCOUNTER — Ambulatory Visit (INDEPENDENT_AMBULATORY_CARE_PROVIDER_SITE_OTHER): Payer: BC Managed Care – PPO | Admitting: Orthopaedic Surgery

## 2019-08-07 ENCOUNTER — Encounter: Payer: Self-pay | Admitting: Orthopaedic Surgery

## 2019-08-07 ENCOUNTER — Other Ambulatory Visit: Payer: Self-pay

## 2019-08-07 ENCOUNTER — Ambulatory Visit: Payer: Self-pay

## 2019-08-07 VITALS — Ht 67.0 in | Wt 328.4 lb

## 2019-08-07 DIAGNOSIS — M1712 Unilateral primary osteoarthritis, left knee: Secondary | ICD-10-CM | POA: Diagnosis not present

## 2019-08-07 MED ORDER — METHYLPREDNISOLONE ACETATE 40 MG/ML IJ SUSP
40.0000 mg | INTRAMUSCULAR | Status: AC | PRN
  Administered 2019-08-07: 40 mg via INTRA_ARTICULAR

## 2019-08-07 MED ORDER — LIDOCAINE HCL 1 % IJ SOLN
2.0000 mL | INTRAMUSCULAR | Status: AC | PRN
  Administered 2019-08-07: 2 mL

## 2019-08-07 MED ORDER — BUPIVACAINE HCL 0.25 % IJ SOLN
2.0000 mL | INTRAMUSCULAR | Status: AC | PRN
  Administered 2019-08-07: 2 mL via INTRA_ARTICULAR

## 2019-08-07 NOTE — Progress Notes (Signed)
Office Visit Note   Patient: Rickey Calderon           Date of Birth: Nov 23, 1965           MRN: [id number] Visit Date: 08/07/2019              Requested by: Ladell Pier, MD [address] [place],  [place] [address] PCP: Ladell Pier, MD   Assessment & Plan: Visit Diagnoses:  1. Primary osteoarthritis of left knee     Plan: Impression is advanced generative changes left knee.  We injected the left knee with cortisone today.  He is going to try make all efforts at weight loss as definitive treatment will be a total knee replacement.  He currently has a BMI of 51.43 and 2 be under 40.0 he needs to get to about 250 pounds.  He understands and agrees.  He will follow-up with [place] as needed.  Follow-Up Instructions: Return if symptoms worsen or fail to improve.   Orders:  Orders Placed This Encounter  Procedures  . Large Joint Inj: L knee  . XR KNEE 3 VIEW LEFT   No orders of the defined types were placed in this encounter.     Procedures: Large Joint Inj: L knee on 08/07/2019 4:42 PM Indications: pain Details: 22 G needle, anterolateral approach Medications: 2 mL lidocaine 1 %; 2 mL bupivacaine 0.25 %; 40 mg methylPREDNISolone acetate 40 MG/ML      Clinical Data: No additional findings.   Subjective: Chief Complaint  Patient presents with  . Left Knee - Pain    HPI patient is a pleasant 54 year old gentleman who comes in today with left knee pain.  This is been ongoing for the past year and is progressively worsened.  He did fall last Friday on the anterior aspect which seem to exacerbated his symptoms.  Pain he has is to the entire knee with associated stiffness.  No mechanical symptoms.  Walking as well as going from a seated to standing position worsens his symptoms.  He has been taking tramadol without significant relief of symptoms.  No previous injection or surgical intervention to the left knee.  Review of Systems as detailed in HPI.  All others  reviewed and are negative.   Objective: Vital Signs: Ht 5\' 7"  (1.702 m)   Wt (!) 328 lb 6.4 oz (149 kg)   BMI 51.43 kg/m   Physical Exam well-developed well-nourished gentleman in no acute distress.  Alert and oriented x3.  Ortho Exam examination of left knee shows range of motion from 0 to 90 degrees.  Minimal joint line tenderness.  Moderate patellofemoral crepitus.  He is neurovascular intact distally.  Specialty Comments:  No specialty comments available.  Imaging: XR KNEE 3 VIEW LEFT  Result Date: 08/07/2019 Bone on bone medial compartment with patellofemoral and lateral compartment degenerative changes    PMFS History: Patient Active Problem List   Diagnosis Date Noted  . Primary osteoarthritis of left knee 06/03/2019  . Disturbance in sleep behavior 08/01/2017  . Hypertensive cardiomyopathy, without heart failure ([place]) 05/17/2016  . OSA (obstructive sleep apnea) 04/22/2016  . Exertional dyspnea 04/22/2016  . Obesity hypoventilation syndrome ([place]) 04/22/2016  . CKD (chronic kidney disease) stage 3, GFR 30-59 ml/min 02/02/2016  . Chronic combined systolic and diastolic heart failure, NYHA class 2 ([place]) 09/29/2015  . Chronic neck pain 09/21/2015  . Asthma   . Depression 07/10/2015  . Financial difficulties 04/30/2015  . Morbid obesity ([place]) 03/31/2015  .  Chronic low back pain 03/31/2015  . OSA on CPAP 07/25/2014  . Essential hypertension   . Gout   . Seasonal allergies   . Chronic prostatitis   . Hip pain 01/11/2013  . Otitis media of left ear 01/11/2013  . Plantar wart of left foot 01/11/2013   Past Medical History:  Diagnosis Date  . Asthma   . Bronchitis   . CHF (congestive heart failure) (HCC)    EF 30% by echo,  EF 50% Nov 2017  . Depression   . Femoral thrombosis (HCC)   . Gout   . Hypertension   . Morbid obesity (HCC)   . Prostatitis   . Seasonal allergies   . Sleep apnea    BiPAP    Family History  Problem Relation Age of Onset  .  Diabetes Father   . Hyperlipidemia Father     Past Surgical History:  Procedure Laterality Date  . ABDOMINAL AORTAGRAM N/A 07/28/2014   Procedure: ABDOMINAL Ronny Flurry;  Surgeon: Chuck Hint, MD;  Location: [facility] CATH LAB;  Service: Cardiovascular;  Laterality: N/A;  . LOWER EXTREMITY ANGIOGRAM Bilateral 07/28/2014   Procedure: LOWER EXTREMITY ANGIOGRAM;  Surgeon: Chuck Hint, MD;  Location: [facility] CATH LAB;  Service: Cardiovascular;  Laterality: Bilateral;  . THROMBECTOMY FEMORAL ARTERY Right 08/01/2014   Procedure: THROMBECTOMY RIGHT LEG SFA, Anterior tibial, and perineal arteries with intraoperative arteriograms;  Surgeon: Nada Libman, MD;  Location: [facility] OR;  Service: Vascular;  Laterality: Right;   Social History   Occupational History  . Not on file  Tobacco Use  . Smoking status: Never Smoker  . Smokeless tobacco: Never Used  Substance and Sexual Activity  . Alcohol use: No  . Drug use: No  . Sexual activity: Not on file

## 2019-08-08 ENCOUNTER — Other Ambulatory Visit: Payer: Self-pay

## 2019-08-08 ENCOUNTER — Encounter (HOSPITAL_COMMUNITY): Payer: Self-pay | Admitting: Emergency Medicine

## 2019-08-08 ENCOUNTER — Emergency Department (HOSPITAL_COMMUNITY)
Admission: EM | Admit: 2019-08-08 | Discharge: 2019-08-08 | Disposition: A | Discharge status: Home / Self Care | Payer: BC Managed Care – PPO | Attending: Emergency Medicine | Admitting: Emergency Medicine

## 2019-08-08 DIAGNOSIS — M5410 Radiculopathy, site unspecified: Secondary | ICD-10-CM | POA: Diagnosis not present

## 2019-08-08 DIAGNOSIS — J45909 Unspecified asthma, uncomplicated: Secondary | ICD-10-CM | POA: Diagnosis not present

## 2019-08-08 DIAGNOSIS — I5042 Chronic combined systolic (congestive) and diastolic (congestive) heart failure: Secondary | ICD-10-CM | POA: Insufficient documentation

## 2019-08-08 DIAGNOSIS — N183 Chronic kidney disease, stage 3 unspecified: Secondary | ICD-10-CM | POA: Diagnosis not present

## 2019-08-08 DIAGNOSIS — Z79899 Other long term (current) drug therapy: Secondary | ICD-10-CM | POA: Diagnosis not present

## 2019-08-08 DIAGNOSIS — I130 Hypertensive heart and chronic kidney disease with heart failure and stage 1 through stage 4 chronic kidney disease, or unspecified chronic kidney disease: Secondary | ICD-10-CM | POA: Insufficient documentation

## 2019-08-08 DIAGNOSIS — M79652 Pain in left thigh: Secondary | ICD-10-CM | POA: Insufficient documentation

## 2019-08-08 DIAGNOSIS — M545 Low back pain: Secondary | ICD-10-CM | POA: Diagnosis not present

## 2019-08-08 DIAGNOSIS — M79605 Pain in left leg: Secondary | ICD-10-CM | POA: Diagnosis not present

## 2019-08-08 MED ORDER — CYCLOBENZAPRINE HCL 10 MG PO TABS: 10.0000 mg | ORAL_TABLET | Freq: Two times a day (BID) | ORAL | 0 refills | Status: DC | PRN

## 2019-08-08 MED FILL — CYCLOBENZAPRINE 10 MG TAB: 10 | 10 days supply | Qty: 20 | Fill #0

## 2019-08-08 NOTE — ED Provider Notes (Signed)
[facility] EMERGENCY DEPARTMENT Provider Note   CSN: [id number] Arrival date & time: 08/08/19  9509     History Chief Complaint  Patient presents with  . Sciatica    Rickey Calderon is a 54 y.o. male.  The history is provided by the patient and medical records. No language interpreter was used.     54 year old male with history of gout, CHF, hypertension, obesity, osteoarthritis, history of sciatica presenting complaining of radicular leg pain.  Patient report yesterday he had an injection of steroid to his left knee due to chronic knee pain.  This was done by his orthopedist.  It did alleviate his knee pain.  However last night he noticed pain that started in his left low back radiates towards the lateral aspect of his left thigh.  Pain is described as a sharp shooting sensation worsening with movement and somewhat reminiscent of prior sciatic pain.  Pain is rated as 6 out of 10.  No associated fever or chills no abdominal pain no hematuria dysuria bowel bladder incontinence or saddle anesthesia.  Pain does not radiates down to his foot.  Denies any recent injury but did recall falling a week ago landing on his left knee.  He denies any focal numbness or focal weakness.  No specific treatment tried.  Past Medical History:  Diagnosis Date  . Asthma   . Bronchitis   . CHF (congestive heart failure) (HCC)    EF 30% by echo,  EF 50% Nov 2017  . Depression   . Femoral thrombosis (HCC)   . Gout   . Hypertension   . Morbid obesity (HCC)   . Prostatitis   . Seasonal allergies   . Sleep apnea    BiPAP    Patient Active Problem List   Diagnosis Date Noted  . Primary osteoarthritis of left knee 06/03/2019  . Disturbance in sleep behavior 08/01/2017  . Hypertensive cardiomyopathy, without heart failure (HCC) 05/17/2016  . OSA (obstructive sleep apnea) 04/22/2016  . Exertional dyspnea 04/22/2016  . Obesity hypoventilation syndrome (HCC) 04/22/2016  . CKD (chronic  kidney disease) stage 3, GFR 30-59 ml/min 02/02/2016  . Chronic combined systolic and diastolic heart failure, NYHA class 2 (HCC) 09/29/2015  . Chronic neck pain 09/21/2015  . Asthma   . Depression 07/10/2015  . Financial difficulties 04/30/2015  . Morbid obesity (HCC) 03/31/2015  . Chronic low back pain 03/31/2015  . OSA on CPAP 07/25/2014  . Essential hypertension   . Gout   . Seasonal allergies   . Chronic prostatitis   . Hip pain 01/11/2013  . Otitis media of left ear 01/11/2013  . Plantar wart of left foot 01/11/2013    Past Surgical History:  Procedure Laterality Date  . ABDOMINAL AORTAGRAM N/A 07/28/2014   Procedure: ABDOMINAL Ronny Flurry;  Surgeon: Chuck Hint, MD;  Location: [facility] CATH LAB;  Service: Cardiovascular;  Laterality: N/A;  . LOWER EXTREMITY ANGIOGRAM Bilateral 07/28/2014   Procedure: LOWER EXTREMITY ANGIOGRAM;  Surgeon: Chuck Hint, MD;  Location: [facility] CATH LAB;  Service: Cardiovascular;  Laterality: Bilateral;  . THROMBECTOMY FEMORAL ARTERY Right 08/01/2014   Procedure: THROMBECTOMY RIGHT LEG SFA, Anterior tibial, and perineal arteries with intraoperative arteriograms;  Surgeon: Nada Libman, MD;  Location: [facility] OR;  Service: Vascular;  Laterality: Right;       Family History  Problem Relation Age of Onset  . Diabetes Father   . Hyperlipidemia Father     Social History   Tobacco Use  .  Smoking status: Never Smoker  . Smokeless tobacco: Never Used  Substance Use Topics  . Alcohol use: No  . Drug use: No    Home Medications Prior to Admission medications   Medication Sig Start Date End Date Taking? Authorizing Provider  albuterol (VENTOLIN HFA) 108 (90 Base) MCG/ACT inhaler Inhale 2 puffs into the lungs every 6 (six) hours as needed for wheezing or shortness of breath. 04/15/19   Marcine Matar, MD  allopurinol (ZYLOPRIM) 300 MG tablet TAKE 1 TABLET (300 MG TOTAL) BY MOUTH DAILY. 02/05/19   Marcine Matar, MD  amLODipine (NORVASC)  5 MG tablet TAKE 1 & 1/2 TABLETS (7.5 MG TOTAL) BY MOUTH ONCE A DAY 06/03/19   Marcine Matar, MD  carvedilol (COREG) 12.5 MG tablet Take 1 tablet (12.5 mg total) by mouth 2 (two) times daily with a meal. 04/15/19   Marcine Matar, MD  cetirizine (ZYRTEC) 10 MG tablet Take 1 tablet (10 mg total) by mouth daily. 09/22/17   Marcine Matar, MD  cyclobenzaprine (FLEXERIL) 10 MG tablet TAKE 1 TABLET BY MOUTH TWO TIMES DAILY AS NEEDED 07/12/19   Marcine Matar, MD  Diclofenac Sodium 3 % GEL Apply 2-4 times daily to LT knee as needed 04/15/19   Marcine Matar, MD  furosemide (LASIX) 40 MG tablet Take 1 tablet (40 mg total) by mouth daily. 04/15/19   Marcine Matar, MD  gabapentin (NEURONTIN) 300 MG capsule Take 1 capsule (300 mg total) by mouth 3 (three) times daily. 04/15/19   Marcine Matar, MD  hydrALAZINE (APRESOLINE) 25 MG tablet Take 3 tablets (75 mg total) by mouth 3 (three) times daily. 06/03/19   Marcine Matar, MD  isosorbide dinitrate (ISORDIL) 20 MG tablet Take 2 tablets (40 mg total) by mouth 3 (three) times daily. 06/03/19   Marcine Matar, MD  methocarbamol (ROBAXIN) 500 MG tablet Take 1 tablet (500 mg total) by mouth 2 (two) times daily. 11/29/18   Bethel Born, PA-C  Potassium Chloride ER 20 MEQ TBCR Take 1 tablet by mouth daily. 04/15/19   Marcine Matar, MD  sertraline (ZOLOFT) 50 MG tablet Take 1 tablet (50 mg total) by mouth daily. 06/03/19   Marcine Matar, MD  tamsulosin (FLOMAX) 0.4 MG CAPS capsule TAKE 1 CAPSULE BY MOUTH DAILY AFTER SUPPER. 06/26/18   McClung, Marzella Schlein, PA-C  traMADol (ULTRAM) 50 MG tablet TAKE 1 TABLET (50 MG TOTAL) BY MOUTH EVERY 8 (EIGHT) HOURS AS NEEDED. 07/13/19   Marcine Matar, MD    Allergies    Patient has no known allergies.  Review of Systems   Review of Systems  Constitutional: Negative for fever.  Musculoskeletal: Positive for arthralgias and back pain.  Skin: Negative for wound.  Neurological:  Negative for numbness.    Physical Exam Updated Vital Signs BP (!) 145/111 (BP Location: Left Wrist)   Pulse 84   Temp 99.5 F (37.5 C) (Oral)   Resp 16   SpO2 100%   Physical Exam Vitals and nursing note reviewed.  Constitutional:      General: He is not in acute distress.    Appearance: He is well-developed. He is obese.  HENT:     Head: Atraumatic.  Eyes:     Conjunctiva/sclera: Conjunctivae normal.  Musculoskeletal:        General: Tenderness (Tenderness to left lumbar sacral and left lateral hip and thigh with decreased hip flexion) present.     Cervical back: Neck  supple.     Comments: Ambulate with a limp favoring R leg.   Skin:    Capillary Refill: Capillary refill takes less than 2 seconds.     Findings: No rash.  Neurological:     Mental Status: He is alert.     Comments: Patella DTR intact bilaterally, no foot drops     ED Results / Procedures / Treatments   Labs (all labs ordered are listed, but only abnormal results are displayed) Labs Reviewed - No data to display  EKG None  Radiology XR KNEE 3 VIEW LEFT  Result Date: 08/07/2019 Bone on bone medial compartment with patellofemoral and lateral compartment degenerative changes   Procedures Procedures (including critical care time)  Medications Ordered in ED Medications - No data to display  ED Course  I have reviewed the triage vital signs and the nursing notes.  Pertinent labs & imaging results that were available during my care of the patient were reviewed by me and considered in my medical decision making (see chart for details).    MDM Rules/Calculators/A&P                      BP (!) 145/111 (BP Location: Left Wrist)   Pulse 84   Temp 99.5 F (37.5 C) (Oral)   Resp 16   SpO2 100%   Final Clinical Impression(s) / ED Diagnoses Final diagnoses:  Radicular leg pain    Rx / DC Orders ED Discharge Orders         Ordered    cyclobenzaprine (FLEXERIL) 10 MG tablet  2 times daily  PRN     08/08/19 1024         10:23 AM Patient here with radicular left leg pain which could be sciatica versus muscle strain.  Negative straight leg raise.  Suspect muscle strain.  Will prescribe muscle relaxant and rice therapy.  Do not think steroid is indicated at this time.  No red flags.   Domenic Moras, PA-C 08/08/19 1025    Isla Pence, MD 08/08/19 1040

## 2019-08-08 NOTE — ED Triage Notes (Signed)
Patient c/o left side pain onset while at work today. States it could be a sciatica flare up.

## 2019-08-08 NOTE — ED Notes (Signed)
Patient given discharge instructions patient verbalizes understanding. 

## 2019-08-12 ENCOUNTER — Other Ambulatory Visit: Payer: Self-pay | Admitting: Internal Medicine

## 2019-08-12 DIAGNOSIS — M1712 Unilateral primary osteoarthritis, left knee: Secondary | ICD-10-CM

## 2019-08-12 MED FILL — SERTRALINE HCL 50 MG TABLET: 50 | 30 days supply | Qty: 30 | Fill #1

## 2019-08-12 MED FILL — ALLOPURINOL 300 MG TAB: 300 | 30 days supply | Qty: 30 | Fill #0

## 2019-08-12 MED FILL — GABAPENTIN 300 MG CAPSULE: 300 | 30 days supply | Qty: 90 | Fill #3

## 2019-08-12 MED FILL — AMLODIPINE BESYLATE 5 MG TA: 5 | 30 days supply | Qty: 45 | Fill #2

## 2019-08-12 MED FILL — CARVEDILOL 12.5 MG TABLET: 12.5 | 30 days supply | Qty: 60 | Fill #3

## 2019-08-12 MED FILL — hydrALAZINE HCL 25 MG TABS: 25 | 30 days supply | Qty: 270 | Fill #2

## 2019-08-14 MED FILL — traMADol HCL 50 MG TABS: 50 | 30 days supply | Qty: 90 | Fill #0

## 2019-08-29 MED FILL — ISOSORBIDE DN 20 MG TABLET: 20 | 30 days supply | Qty: 180 | Fill #2

## 2019-09-05 ENCOUNTER — Encounter (HOSPITAL_COMMUNITY): Payer: Self-pay

## 2019-09-05 ENCOUNTER — Emergency Department (HOSPITAL_COMMUNITY): Payer: BC Managed Care – PPO

## 2019-09-05 ENCOUNTER — Emergency Department (HOSPITAL_COMMUNITY)
Admission: EM | Admit: 2019-09-05 | Discharge: 2019-09-05 | Disposition: A | Discharge status: Home / Self Care | Payer: BC Managed Care – PPO | Attending: Emergency Medicine | Admitting: Emergency Medicine

## 2019-09-05 ENCOUNTER — Emergency Department (HOSPITAL_BASED_OUTPATIENT_CLINIC_OR_DEPARTMENT_OTHER)
Admit: 2019-09-05 | Discharge: 2019-09-05 | Disposition: A | Discharge status: Home / Self Care | Payer: BC Managed Care – PPO | Attending: Emergency Medicine | Admitting: Emergency Medicine

## 2019-09-05 ENCOUNTER — Other Ambulatory Visit: Payer: Self-pay

## 2019-09-05 DIAGNOSIS — W19XXXA Unspecified fall, initial encounter: Secondary | ICD-10-CM

## 2019-09-05 DIAGNOSIS — I509 Heart failure, unspecified: Secondary | ICD-10-CM | POA: Diagnosis not present

## 2019-09-05 DIAGNOSIS — I10 Essential (primary) hypertension: Secondary | ICD-10-CM | POA: Diagnosis not present

## 2019-09-05 DIAGNOSIS — Y929 Unspecified place or not applicable: Secondary | ICD-10-CM | POA: Diagnosis not present

## 2019-09-05 DIAGNOSIS — R600 Localized edema: Secondary | ICD-10-CM | POA: Insufficient documentation

## 2019-09-05 DIAGNOSIS — Y999 Unspecified external cause status: Secondary | ICD-10-CM | POA: Insufficient documentation

## 2019-09-05 DIAGNOSIS — J45909 Unspecified asthma, uncomplicated: Secondary | ICD-10-CM | POA: Insufficient documentation

## 2019-09-05 DIAGNOSIS — M7989 Other specified soft tissue disorders: Secondary | ICD-10-CM | POA: Diagnosis not present

## 2019-09-05 DIAGNOSIS — Z79899 Other long term (current) drug therapy: Secondary | ICD-10-CM | POA: Diagnosis not present

## 2019-09-05 DIAGNOSIS — R609 Edema, unspecified: Secondary | ICD-10-CM

## 2019-09-05 DIAGNOSIS — Y939 Activity, unspecified: Secondary | ICD-10-CM | POA: Insufficient documentation

## 2019-09-05 DIAGNOSIS — S8392XA Sprain of unspecified site of left knee, initial encounter: Secondary | ICD-10-CM | POA: Diagnosis not present

## 2019-09-05 DIAGNOSIS — S8992XA Unspecified injury of left lower leg, initial encounter: Secondary | ICD-10-CM | POA: Diagnosis not present

## 2019-09-05 DIAGNOSIS — W01198A Fall on same level from slipping, tripping and stumbling with subsequent striking against other object, initial encounter: Secondary | ICD-10-CM | POA: Diagnosis not present

## 2019-09-05 NOTE — ED Provider Notes (Signed)
[facility] EMERGENCY DEPARTMENT Provider Note   CSN: [id number] Arrival date & time: 09/05/19  1504     History Chief Complaint  Patient presents with  . Knee Pain    Rickey Calderon is a 54 y.o. male.  The history is provided by the patient and medical records. No language interpreter was used.     54 year old morbidly obese male with history of arthritis presenting complaining of left knee pain and left leg swelling.  Patient report approximately a month ago he tripped on carpet and landed on his left knee.  Since then he has noticed increasing pain to his knee.  Pain is described as a sharp throbbing sensation about the knee worse with bending and with ambulation.  Increasing pain when he walks up the steps.  Pain is moderate in severity.  Furthermore, he also have noticed increased swelling to his left lower extremity since the injury.  Swelling is accompanied with pressure sensation to his calf region.  Remote history of blood clot in his right lower extremity.  Is currently not on any blood thinner medication.  He does take tramadol on occasion to alleviate his pain.  No complaints of fever chills chest pain shortness of breath or hemoptysis.  No complaints of hip pain or ankle pain.  No rash.  Past Medical History:  Diagnosis Date  . Asthma   . Bronchitis   . CHF (congestive heart failure) (HCC)    EF 30% by echo,  EF 50% Nov 2017  . Depression   . Femoral thrombosis (HCC)   . Gout   . Hypertension   . Morbid obesity (HCC)   . Prostatitis   . Seasonal allergies   . Sleep apnea    BiPAP    Patient Active Problem List   Diagnosis Date Noted  . Primary osteoarthritis of left knee 06/03/2019  . Disturbance in sleep behavior 08/01/2017  . Hypertensive cardiomyopathy, without heart failure (HCC) 05/17/2016  . OSA (obstructive sleep apnea) 04/22/2016  . Exertional dyspnea 04/22/2016  . Obesity hypoventilation syndrome (HCC) 04/22/2016  . CKD (chronic  kidney disease) stage 3, GFR 30-59 ml/min 02/02/2016  . Chronic combined systolic and diastolic heart failure, NYHA class 2 (HCC) 09/29/2015  . Chronic neck pain 09/21/2015  . Asthma   . Depression 07/10/2015  . Financial difficulties 04/30/2015  . Morbid obesity (HCC) 03/31/2015  . Chronic low back pain 03/31/2015  . OSA on CPAP 07/25/2014  . Essential hypertension   . Gout   . Seasonal allergies   . Chronic prostatitis   . Hip pain 01/11/2013  . Otitis media of left ear 01/11/2013  . Plantar wart of left foot 01/11/2013    Past Surgical History:  Procedure Laterality Date  . ABDOMINAL AORTAGRAM N/A 07/28/2014   Procedure: ABDOMINAL Ronny Flurry;  Surgeon: Chuck Hint, MD;  Location: [facility] C[facility] LAB;  Service: Cardiovascular;  Laterality: N/A;  . LOWER EXTREMITY ANGIOGRAM Bilateral 07/28/2014   Procedure: LOWER EXTREMITY ANGIOGRAM;  Surgeon: Chuck Hint, MD;  Location: [facility] CATH LAB;  Service: Cardiovascular;  Laterality: Bilateral;  . THROMBECTOMY FEMORAL ARTERY Right 08/01/2014   Procedure: THROMBECTOMY RIGHT LEG SFA, Anterior tibial, and perineal arteries with intraoperative arteriograms;  Surgeon: Nada Libman, MD;  Location: [facility] OR;  Service: Vascular;  Laterality: Right;       Family History  Problem Relation Age of Onset  . Diabetes Father   . Hyperlipidemia Father     Social History   Tobacco Use  .  Smoking status: Never Smoker  . Smokeless tobacco: Never Used  Substance Use Topics  . Alcohol use: No  . Drug use: No    Home Medications Prior to Admission medications   Medication Sig Start Date End Date Taking? Authorizing Provider  albuterol (VENTOLIN HFA) 108 (90 Base) MCG/ACT inhaler Inhale 2 puffs into the lungs every 6 (six) hours as needed for wheezing or shortness of breath. 04/15/19   Ladell Pier, MD  allopurinol (ZYLOPRIM) 300 MG tablet TAKE 1 TABLET (300 MG TOTAL) BY MOUTH DAILY. 08/12/19   Ladell Pier, MD  amLODipine (NORVASC)  5 MG tablet TAKE 1 & 1/2 TABLETS (7.5 MG TOTAL) BY MOUTH ONCE A DAY 06/03/19   Ladell Pier, MD  carvedilol (COREG) 12.5 MG tablet Take 1 tablet (12.5 mg total) by mouth 2 (two) times daily with a meal. 04/15/19   Ladell Pier, MD  cetirizine (ZYRTEC) 10 MG tablet Take 1 tablet (10 mg total) by mouth daily. 09/22/17   Ladell Pier, MD  cyclobenzaprine (FLEXERIL) 10 MG tablet Take 1 tablet (10 mg total) by mouth 2 (two) times daily as needed for muscle spasms. 08/08/19   Domenic Moras, PA-C  Diclofenac Sodium 3 % GEL Apply 2-4 times daily to LT knee as needed 04/15/19   Ladell Pier, MD  furosemide (LASIX) 40 MG tablet Take 1 tablet (40 mg total) by mouth daily. 04/15/19   Ladell Pier, MD  gabapentin (NEURONTIN) 300 MG capsule Take 1 capsule (300 mg total) by mouth 3 (three) times daily. 04/15/19   Ladell Pier, MD  hydrALAZINE (APRESOLINE) 25 MG tablet Take 3 tablets (75 mg total) by mouth 3 (three) times daily. 06/03/19   Ladell Pier, MD  isosorbide dinitrate (ISORDIL) 20 MG tablet Take 2 tablets (40 mg total) by mouth 3 (three) times daily. 06/03/19   Ladell Pier, MD  methocarbamol (ROBAXIN) 500 MG tablet Take 1 tablet (500 mg total) by mouth 2 (two) times daily. 11/29/18   Recardo Evangelist, PA-C  Potassium Chloride ER 20 MEQ TBCR Take 1 tablet by mouth daily. 04/15/19   Ladell Pier, MD  sertraline (ZOLOFT) 50 MG tablet Take 1 tablet (50 mg total) by mouth daily. 06/03/19   Ladell Pier, MD  tamsulosin (FLOMAX) 0.4 MG CAPS capsule TAKE 1 CAPSULE BY MOUTH DAILY AFTER SUPPER. 06/26/18   McClung, Dionne Bucy, PA-C  traMADol (ULTRAM) 50 MG tablet Take 1 tablet (50 mg total) by mouth every 8 (eight) hours as needed. Fill on or after 08/14/2019 08/12/19   Ladell Pier, MD    Allergies    Patient has no known allergies.  Review of Systems   Review of Systems  All other systems reviewed and are negative.   Physical Exam Updated Vital  Signs BP 135/87 (BP Location: Left Wrist)   Pulse 83   Temp 98.7 F (37.1 C) (Oral)   Resp 18   Ht 5\' 7"  (1.702 m)   Wt (!) 140.6 kg   SpO2 96%   BMI 48.55 kg/m   Physical Exam Constitutional:      General: He is not in acute distress.    Appearance: He is well-developed. He is obese.  HENT:     Head: Atraumatic.  Eyes:     Conjunctiva/sclera: Conjunctivae normal.  Cardiovascular:     Rate and Rhythm: Normal rate and regular rhythm.     Pulses: Normal pulses.     Heart sounds: Normal  heart sounds.  Pulmonary:     Breath sounds: Normal breath sounds. No wheezing.  Musculoskeletal:        General: Swelling (Left lower extremity with 2+ edema extending towards the knee with intact distal pulses.) and tenderness (Left knee: Tenderness to medial joint line on palpation.  Normal knee flexion and extension, patella is located.) present.     Cervical back: Normal range of motion and neck supple.  Skin:    Capillary Refill: Capillary refill takes less than 2 seconds.     Findings: No rash.  Neurological:     Mental Status: He is alert.     Comments: Able to ambulate.     ED Results / Procedures / Treatments   Labs (all labs ordered are listed, but only abnormal results are displayed) Labs Reviewed - No data to display  EKG None  Radiology DG Knee Left Port  Result Date: 09/05/2019 CLINICAL DATA:  Tripped on rug swelling EXAM: PORTABLE LEFT KNEE - 1-2 VIEW COMPARISON:  08/07/2019 FINDINGS: No acute displaced fracture or malalignment is seen. Mild to moderate patellofemoral degenerative change with mild lateral joint space degenerative change. Advanced degenerative changes of the medial joint space with effaced joint space, sclerosis, and osteophyte. Diffuse soft tissue swelling. IMPRESSION: Tricompartment arthritis.  No definite acute osseous abnormality Electronically Signed   By: Jasmine Pang M.D.   On: 09/05/2019 15:42   VAS [place] LOWER EXTREMITY VENOUS (DVT) ([facility] and [facility]  7a-7p)  Result Date: 09/05/2019  Lower Venous DVTStudy Indications: Edema.  Risk Factors: None identified. Limitations: Body habitus, poor ultrasound/tissue interface and patient positioning. Comparison Study: No prior studies. Performing Technologist: Chanda Busing RVT  Examination Guidelines: A complete evaluation includes B-mode imaging, spectral Doppler, color Doppler, and power Doppler as needed of all accessible portions of each vessel. Bilateral testing is considered an integral part of a complete examination. Limited examinations for reoccurring indications may be performed as noted. The reflux portion of the exam is performed with the patient in reverse Trendelenburg.  +-----+---------------+---------+-----------+----------+--------------+ RIGHTCompressibilityPhasicitySpontaneityPropertiesThrombus Aging +-----+---------------+---------+-----------+----------+--------------+ CFV  Full           Yes      Yes                                 +-----+---------------+---------+-----------+----------+--------------+   +---------+---------------+---------+-----------+----------+--------------+ LEFT     CompressibilityPhasicitySpontaneityPropertiesThrombus Aging +---------+---------------+---------+-----------+----------+--------------+ CFV      Full           Yes      Yes                                 +---------+---------------+---------+-----------+----------+--------------+ SFJ      Full                                                        +---------+---------------+---------+-----------+----------+--------------+ FV Prox  Full                                                        +---------+---------------+---------+-----------+----------+--------------+ FV Mid   Full                                                        +---------+---------------+---------+-----------+----------+--------------+  FV Distal                                              Not visualized +---------+---------------+---------+-----------+----------+--------------+ PFV      Full                                                        +---------+---------------+---------+-----------+----------+--------------+ POP      Full           Yes      Yes                                 +---------+---------------+---------+-----------+----------+--------------+ PTV      Full                                                        +---------+---------------+---------+-----------+----------+--------------+ PERO                                                  Not visualized +---------+---------------+---------+-----------+----------+--------------+     Summary: RIGHT: - No evidence of common femoral vein obstruction.  LEFT: - There is no evidence of deep vein thrombosis in the lower extremity. However, portions of this examination were limited- see technologist comments above.  - No cystic structure found in the popliteal fossa.  *See table(s) above for measurements and observations.    Preliminary     Procedures Procedures (including critical care time)  Medications Ordered in ED Medications - No data to display  ED Course  I have reviewed the triage vital signs and the nursing notes.  Pertinent labs & imaging results that were available during my care of the patient were reviewed by me and considered in my medical decision making (see chart for details).    MDM Rules/Calculators/A&P                      BP 135/87 (BP Location: Left Wrist)   Pulse 83   Temp 98.7 F (37.1 C) (Oral)   Resp 18   Ht 5\' 7"  (1.702 m)   Wt (!) 140.6 kg   SpO2 96%   BMI 48.55 kg/m   Final Clinical Impression(s) / ED Diagnoses Final diagnoses:  Fall  Sprain of left knee, unspecified ligament, initial encounter  Edema of left lower leg    Rx / DC Orders ED Discharge Orders    None     3:31 PM Patient with history of obesity here with left knee pain for more  than a month after he tripped and landed on that knee.  Furthermore he also complaining of swelling about the left lower extremity for the same duration.  Report remote history of DVT.  Plan to obtain x-ray of the left knee as well as venous Doppler study to assess further.  Patient otherwise neurovascular intact.  5:00 PM X-ray of the left knee shows evidence of tricompartmental osteoarthritis but no evidence of acute injury.  It is Doppler studies show no evidence of DVT on left lower extremity.  At this time I encourage rice therapy which includes Voltaren gel cream, continue with tramadol, Ace wrap for the leg swelling, and follow-up with orthopedist for further care.  Work note provided as requested.  Return precaution discussed.  Patient able to ambulate.   Fayrene Helperran, Barry Faircloth, PA-C 09/05/19 1704    Milagros Lollykstra, Richard S, MD 09/11/19 [phone]

## 2019-09-05 NOTE — Discharge Instructions (Signed)
Continue to apply your diclofenac cream to the affected area 2-3 times daily as needed for pain.  Continue to take tramadol previously prescribed by your doctor for pain.  You may alternate that with Tylenol for your arthritis.  Use Ace wrap and wrapped your left leg to help with swelling.  Follow-up with orthopedist for further evaluation of your condition.

## 2019-09-05 NOTE — ED Triage Notes (Signed)
Pt reports left knee pain after mechanical fall 1 month ago, hx of arthritis in that knee.

## 2019-09-05 NOTE — Progress Notes (Signed)
Left lower extremity venous duplex has been completed. Preliminary results can be found in CV Proc through chart review.  Results were given to Fayrene Helper PA.  09/05/19 4:23 PM Olen Cordial RVT

## 2019-09-05 NOTE — ED Notes (Signed)
Patient verbalizes understanding of discharge instructions. Opportunity for questioning and answers were provided. Armband removed by staff, pt discharged from ED.  

## 2019-09-06 ENCOUNTER — Other Ambulatory Visit: Payer: Self-pay | Admitting: Internal Medicine

## 2019-09-06 DIAGNOSIS — M1712 Unilateral primary osteoarthritis, left knee: Secondary | ICD-10-CM

## 2019-09-06 DIAGNOSIS — I10 Essential (primary) hypertension: Secondary | ICD-10-CM

## 2019-09-06 MED FILL — AMLODIPINE BESYLATE 5 MG TA: 5 | 30 days supply | Qty: 45 | Fill #3

## 2019-09-06 MED FILL — CYCLOBENZAPRINE 10 MG TAB: 10 | 20 days supply | Qty: 40 | Fill #1

## 2019-09-06 MED FILL — hydrALAZINE HCL 25 MG TABS: 25 | 10 days supply | Qty: 90 | Fill #3

## 2019-09-06 MED FILL — SERTRALINE HCL 50 MG TABLET: 50 | 30 days supply | Qty: 30 | Fill #2

## 2019-09-06 MED FILL — ALLOPURINOL 300 MG TAB: 300 | 30 days supply | Qty: 30 | Fill #1

## 2019-09-09 NOTE — Telephone Encounter (Signed)
Please fill if appropriate.  

## 2019-09-10 MED FILL — CARVEDILOL 12.5 MG TABLET: 12.5 | 30 days supply | Qty: 60 | Fill #0

## 2019-09-10 MED FILL — traMADol HCL 50 MG TABS: 50 | 30 days supply | Qty: 90 | Fill #0

## 2019-09-10 MED FILL — DICLOFENAC SODIUM 3% GEL: 3 | 30 days supply | Qty: 100 | Fill #0

## 2019-09-30 ENCOUNTER — Other Ambulatory Visit: Payer: Self-pay | Admitting: Internal Medicine

## 2019-09-30 MED FILL — ISOSORBIDE DN 20 MG TABLET: 20 | 30 days supply | Qty: 180 | Fill #3

## 2019-10-01 MED FILL — hydrALAZINE HCL 25 MG TABS: 25 | 30 days supply | Qty: 270 | Fill #0

## 2019-10-07 ENCOUNTER — Other Ambulatory Visit: Payer: Self-pay | Admitting: Internal Medicine

## 2019-10-07 DIAGNOSIS — I10 Essential (primary) hypertension: Secondary | ICD-10-CM

## 2019-10-07 MED FILL — SERTRALINE HCL 50 MG TABLET: 50 | 30 days supply | Qty: 30 | Fill #3

## 2019-10-08 MED FILL — CYCLOBENZAPRINE 10 MG TAB: 10 | 20 days supply | Qty: 40 | Fill #0

## 2019-10-08 MED FILL — AMLODIPINE BESYLATE 5 MG TA: 5 | 30 days supply | Qty: 45 | Fill #0

## 2019-10-14 ENCOUNTER — Other Ambulatory Visit: Payer: Self-pay

## 2019-10-14 ENCOUNTER — Ambulatory Visit: Payer: BC Managed Care – PPO | Attending: Internal Medicine | Admitting: Internal Medicine

## 2019-10-14 ENCOUNTER — Encounter: Payer: Self-pay | Admitting: Internal Medicine

## 2019-10-14 VITALS — BP 133/87 | HR 76 | Temp 97.9°F | Resp 16 | Wt 316.0 lb

## 2019-10-14 DIAGNOSIS — Z79899 Other long term (current) drug therapy: Secondary | ICD-10-CM

## 2019-10-14 DIAGNOSIS — I10 Essential (primary) hypertension: Secondary | ICD-10-CM

## 2019-10-14 DIAGNOSIS — I119 Hypertensive heart disease without heart failure: Secondary | ICD-10-CM | POA: Diagnosis not present

## 2019-10-14 DIAGNOSIS — F32A Depression, unspecified: Secondary | ICD-10-CM

## 2019-10-14 DIAGNOSIS — M1712 Unilateral primary osteoarthritis, left knee: Secondary | ICD-10-CM

## 2019-10-14 DIAGNOSIS — N1831 Chronic kidney disease, stage 3a: Secondary | ICD-10-CM

## 2019-10-14 DIAGNOSIS — I43 Cardiomyopathy in diseases classified elsewhere: Secondary | ICD-10-CM

## 2019-10-14 DIAGNOSIS — F329 Major depressive disorder, single episode, unspecified: Secondary | ICD-10-CM

## 2019-10-14 DIAGNOSIS — Z2821 Immunization not carried out because of patient refusal: Secondary | ICD-10-CM | POA: Insufficient documentation

## 2019-10-14 DIAGNOSIS — Z1211 Encounter for screening for malignant neoplasm of colon: Secondary | ICD-10-CM

## 2019-10-14 MED ORDER — FUROSEMIDE 40 MG PO TABS: 40.0000 mg | ORAL_TABLET | Freq: Every day | ORAL | 5 refills | Status: AC

## 2019-10-14 MED ORDER — ACETAMINOPHEN-CODEINE 300-30 MG PO TABS: 1.0000 | ORAL_TABLET | Freq: Four times a day (QID) | ORAL | 0 refills | Status: AC | PRN

## 2019-10-14 MED ORDER — ISOSORBIDE DINITRATE 20 MG PO TABS: 40.0000 mg | ORAL_TABLET | Freq: Three times a day (TID) | ORAL | 6 refills | Status: AC

## 2019-10-14 MED ORDER — SERTRALINE HCL 50 MG PO TABS: 50.0000 mg | ORAL_TABLET | Freq: Every day | ORAL | 5 refills | Status: AC

## 2019-10-14 MED ORDER — AMLODIPINE BESYLATE 5 MG PO TABS: 7.5000 mg | ORAL_TABLET | Freq: Every day | ORAL | 6 refills | Status: AC

## 2019-10-14 NOTE — Patient Instructions (Addendum)
We have changed the tramadol to Tylenol with codeine.  I have referred you to medical weight management program.  They will call you with an appointment.  Fall Prevention in the Home, Adult Falls can cause injuries. They can happen to people of all ages. There are many things you can do to make your home safe and to help prevent falls. Ask for help when making these changes, if needed. What actions can I take to prevent falls? General Instructions  Use good lighting in all rooms. Replace any light bulbs that burn out.  Turn on the lights when you go into a dark area. Use night-lights.  Keep items that you use often in easy-to-reach places. Lower the shelves around your home if necessary.  Set up your furniture so you have a clear path. Avoid moving your furniture around.  Do not have throw rugs and other things on the floor that can make you trip.  Avoid walking on wet floors.  If any of your floors are uneven, fix them.  Add color or contrast paint or tape to clearly mark and help you see: ? Any grab bars or handrails. ? First and last steps of stairways. ? Where the edge of each step is.  If you use a stepladder: ? Make sure that it is fully opened. Do not climb a closed stepladder. ? Make sure that both sides of the stepladder are locked into place. ? Ask someone to hold the stepladder for you while you use it.  If there are any pets around you, be aware of where they are. What can I do in the bathroom?      Keep the floor dry. Clean up any water that spills onto the floor as soon as it happens.  Remove soap buildup in the tub or shower regularly.  Use non-skid mats or decals on the floor of the tub or shower.  Attach bath mats securely with double-sided, non-slip rug tape.  If you need to sit down in the shower, use a plastic, non-slip stool.  Install grab bars by the toilet and in the tub and shower. Do not use towel bars as grab bars. What can I do in the  bedroom?  Make sure that you have a light by your bed that is easy to reach.  Do not use any sheets or blankets that are too big for your bed. They should not hang down onto the floor.  Have a firm chair that has side arms. You can use this for support while you get dressed. What can I do in the kitchen?  Clean up any spills right away.  If you need to reach something above you, use a strong step stool that has a grab bar.  Keep electrical cords out of the way.  Do not use floor polish or wax that makes floors slippery. If you must use wax, use non-skid floor wax. What can I do with my stairs?  Do not leave any items on the stairs.  Make sure that you have a light switch at the top of the stairs and the bottom of the stairs. If you do not have them, ask someone to add them for you.  Make sure that there are handrails on both sides of the stairs, and use them. Fix handrails that are broken or loose. Make sure that handrails are as long as the stairways.  Install non-slip stair treads on all stairs in your home.  Avoid having throw rugs  at the top or bottom of the stairs. If you do have throw rugs, attach them to the floor with carpet tape.  Choose a carpet that does not hide the edge of the steps on the stairway.  Check any carpeting to make sure that it is firmly attached to the stairs. Fix any carpet that is loose or worn. What can I do on the outside of my home?  Use bright outdoor lighting.  Regularly fix the edges of walkways and driveways and fix any cracks.  Remove anything that might make you trip as you walk through a door, such as a raised step or threshold.  Trim any bushes or trees on the path to your home.  Regularly check to see if handrails are loose or broken. Make sure that both sides of any steps have handrails.  Install guardrails along the edges of any raised decks and porches.  Clear walking paths of anything that might make someone trip, such as tools  or rocks.  Have any leaves, snow, or ice cleared regularly.  Use sand or salt on walking paths during winter.  Clean up any spills in your garage right away. This includes grease or oil spills. What other actions can I take?  Wear shoes that: ? Have a low heel. Do not wear high heels. ? Have rubber bottoms. ? Are comfortable and fit you well. ? Are closed at the toe. Do not wear open-toe sandals.  Use tools that help you move around (mobility aids) if they are needed. These include: ? Canes. ? Walkers. ? Scooters. ? Crutches.  Review your medicines with your doctor. Some medicines can make you feel dizzy. This can increase your chance of falling. Ask your doctor what other things you can do to help prevent falls. Where to find more information  Centers for Disease Control and Prevention, [employer]: HealthcareCounselor.com.pt  [employer] on Aging: RingConnections.si Contact a doctor if:  You are afraid of falling at home.  You feel weak, drowsy, or dizzy at home.  You fall at home. Summary  There are many simple things that you can do to make your home safe and to help prevent falls.  Ways to make your home safe include removing tripping hazards and installing grab bars in the bathroom.  Ask for help when making these changes in your home. This information is not intended to replace advice given to you by your health care provider. Make sure you discuss any questions you have with your health care provider. Document Revised: 10/04/2018 Document Reviewed: 01/26/2017 Elsevier Patient Education  2020 [employer].

## 2019-10-14 NOTE — Progress Notes (Signed)
Patient ID: Rickey Calderon, male    DOB: 05-27-1966  MRN: [id number]  CC: Hypertension   Subjective: Rickey Calderon is a 54 y.o. male who presents for chronic ds management. 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   OA knee    OA knee LT knee:  Saw Dr. Roda Shutters 08/07/2019 and had inj.  Did not find it helpful.  Told he would need to get wgh below 250 lbs before they would consider him for wgh reduction surgery.  Taking Tramadol Q 8hrs but feels that it is no longer helpful.  He is wondering whether I can prescribe something stronger.  He used to walk to the bus stop from his house which was about 1 mile to go to work but can no longer do that because the left knee hurts too bad.  He has to take a cab every day to work.  He denies any falls.    HYPERTENSION Currently taking: see medication list Med Adherence: [x]  Yes    []  No Medication side effects: []  Yes    [x]  No Adherence with salt restriction: [x]  Yes    []  No Home Monitoring?: []  Yes    [x]  No Monitoring Frequency: []  Yes    []  No Home BP results range: []  Yes    []  No SOB? []  Yes    [x]  No Chest Pain?: []  Yes    [x]  No Leg swelling?: [x]  Yes but admits that he does not take the furosemide and potassium consistently.   []  No Headaches?: []  Yes    [x]  No Dizziness? []  Yes    [x]  No Comments: Had echo done in December.  This reveals that his heart function had normalized.  OSA:  Waiting for some supplies to come in for his BiPAP.  Last uses a few wks ago.   Morbid Obesity:  Not eating as much.  "But for me its the snacks - cookies, popcorn, pretzels that I need to cut back on." Use to walk 1  mile a day to get to bus station.  Now taking cab to work  Patient Active Problem List   Diagnosis Date Noted  . Primary osteoarthritis of left knee 06/03/2019  . Disturbance in sleep behavior 08/01/2017  . Hypertensive cardiomyopathy, without heart failure (HCC) 05/17/2016  . OSA (obstructive sleep apnea) 04/22/2016  . Exertional dyspnea 04/22/2016  . Obesity hypoventilation syndrome (HCC) 04/22/2016  . CKD (chronic kidney disease) stage 3, GFR 30-59 ml/min 02/02/2016  . Chronic combined systolic and diastolic heart failure, NYHA class 2 (HCC) 09/29/2015  . Chronic neck pain 09/21/2015  . Asthma   . Depression 07/10/2015  . Financial difficulties 04/30/2015  . Morbid obesity (HCC) 03/31/2015  . Chronic low back pain 03/31/2015  . OSA on CPAP 07/25/2014  . Essential hypertension   . Gout   . Seasonal allergies   . Chronic prostatitis   . Hip pain 01/11/2013  . Otitis media of left ear 01/11/2013  . Plantar wart of left foot 01/11/2013     Current Outpatient Medications on File Prior to Visit  Medication Sig Dispense Refill  . albuterol (VENTOLIN HFA) 108 (90 Base) MCG/ACT inhaler Inhale  2 puffs into the lungs every 6 (six) hours as needed for wheezing or shortness of breath. 8 g 6  . allopurinol (ZYLOPRIM) 300 MG tablet TAKE 1 TABLET (300 MG TOTAL) BY MOUTH DAILY. 30 tablet 5  . amLODipine (NORVASC) 5 MG tablet TAKE 1 & 1/2 TABLETS (7.5 MG TOTAL) BY MOUTH ONCE A DAY 45 tablet 0  . carvedilol (COREG) 12.5 MG tablet TAKE 1 TABLET (12.5 MG TOTAL) BY MOUTH 2 (TWO) TIMES DAILY WITH A MEAL. 60 tablet 3  . cetirizine (ZYRTEC) 10 MG tablet Take 1 tablet (10 mg total) by mouth daily. 30 tablet 3  . cyclobenzaprine (FLEXERIL) 10 MG tablet TAKE 1 TABLET BY MOUTH TWO TIMES DAILY AS NEEDED 40 tablet 0  . Diclofenac Sodium 3 % GEL APPLY 2-4 TIMES DAILY TO LT KNEE AS NEEDED 100 g 0  . furosemide (LASIX) 40 MG tablet Take 1 tablet (40 mg total) by mouth daily. 30 tablet 5  . gabapentin  (NEURONTIN) 300 MG capsule Take 1 capsule (300 mg total) by mouth 3 (three) times daily. 90 capsule 5  . hydrALAZINE (APRESOLINE) 25 MG tablet TAKE 3 TABLETS (75 MG TOTAL) BY MOUTH 3 (THREE) TIMES DAILY. 270 tablet 2  . isosorbide dinitrate (ISORDIL) 20 MG tablet Take 2 tablets (40 mg total) by mouth 3 (three) times daily. 180 tablet 6  . methocarbamol (ROBAXIN) 500 MG tablet Take 1 tablet (500 mg total) by mouth 2 (two) times daily. 20 tablet 0  . Potassium Chloride ER 20 MEQ TBCR Take 1 tablet by mouth daily. 30 tablet 5  . sertraline (ZOLOFT) 50 MG tablet Take 1 tablet (50 mg total) by mouth daily. 30 tablet 5  . tamsulosin (FLOMAX) 0.4 MG CAPS capsule TAKE 1 CAPSULE BY MOUTH DAILY AFTER SUPPER. 30 capsule 6  . traMADol (ULTRAM) 50 MG tablet TAKE 1 TABLET (50 MG TOTAL) BY MOUTH EVERY 8 (EIGHT) HOURS AS NEEDED. FILL ON OR AFTER 08/14/2019 90 tablet 0   No current facility-administered medications on file prior to visit.    No Known Allergies  Social History   Socioeconomic History  . Marital status: Married    Spouse name: Not on file  . Number of children: Not on file  . Years of education: Not on file  . Highest education level: Not on file  Occupational History  . Not on file  Tobacco Use  . Smoking status: Never Smoker  . Smokeless tobacco: Never Used  Substance and Sexual Activity  . Alcohol use: No  . Drug use: No  . Sexual activity: Not on file  Other Topics Concern  . Not on file  Social History Narrative  . Not on file   Social Determinants of Health   Financial Resource Strain:   . Difficulty of Paying Living Expenses:   Food Insecurity:   . Worried About Charity fundraiser in the Last Year:   . Arboriculturist in the Last Year:   Transportation Needs:   . Film/video editor (Medical):   Marland Kitchen Lack of Transportation (Non-Medical):   Physical Activity:   . Days of Exercise per Week:   . Minutes of Exercise per Session:   Stress:   . Feeling of Stress :     Social Connections:   . Frequency of Communication with Friends and Family:   . Frequency of Social Gatherings with Friends and Family:   . Attends Religious Services:   . Active Member of Clubs or Organizations:   .  Attends Banker Meetings:   Marland Kitchen Marital Status:   Intimate Partner Violence:   . Fear of Current or Ex-Partner:   . Emotionally Abused:   Marland Kitchen Physically Abused:   . Sexually Abused:     Family History  Problem Relation Age of Onset  . Diabetes Father   . Hyperlipidemia Father     Past Surgical History:  Procedure Laterality Date  . ABDOMINAL AORTAGRAM N/A 07/28/2014   Procedure: ABDOMINAL Ronny Flurry;  Surgeon: Chuck Hint, MD;  Location: [facility] CATH LAB;  Service: Cardiovascular;  Laterality: N/A;  . LOWER EXTREMITY ANGIOGRAM Bilateral 07/28/2014   Procedure: LOWER EXTREMITY ANGIOGRAM;  Surgeon: Chuck Hint, MD;  Location: [facility] CATH LAB;  Service: Cardiovascular;  Laterality: Bilateral;  . THROMBECTOMY FEMORAL ARTERY Right 08/01/2014   Procedure: THROMBECTOMY RIGHT LEG SFA, Anterior tibial, and perineal arteries with intraoperative arteriograms;  Surgeon: Nada Libman, MD;  Location: [facility] OR;  Service: Vascular;  Laterality: Right;    ROS: Review of Systems Negative except as stated above  PHYSICAL EXAM: BP 133/87   Pulse 76   Temp 97.9 F (36.6 C)   Resp 16   Wt (!) 316 lb (143.3 kg)   SpO2 97%   BMI 49.49 kg/m   . Wt Readings from Last 3 Encounters:  10/14/19 (!) 316 lb (143.3 kg)  09/05/19 (!) 310 lb (140.6 kg)  08/07/19 (!) 328 lb 6.4 oz (149 kg)    Physical Exam  General appearance - alert, well appearing, and in no distress Mental status - normal mood, behavior, speech, dress, motor activity, and thought processes Neck - supple, no significant adenopathy Chest - clear to auscultation, no wheezes, rales or rhonchi, symmetric air entry Heart - normal rate, regular rhythm, normal S1, S2, no murmurs, rubs, clicks or  gallops Extremities -1+ lower extremity edema MSK: Patient has valgus deformity of the left knee.  He has been antalgic gait favoring his left knee.  He is unable to get on the exam table. CMP Latest Ref Rng & Units 04/15/2019 12/12/2018 06/26/2018  Glucose 65 - 99 mg/dL 106(Y) 694(W) 77  BUN 6 - 24 mg/dL 54(O) 27(O) 35(K)  Creatinine 0.76 - 1.27 mg/dL 0.93(G) 1.82 9.93(Z)  Sodium 134 - 144 mmol/L 144 140 148(H)  Potassium 3.5 - 5.2 mmol/L 3.7 3.6 4.6  Chloride 96 - 106 mmol/L 100 105 101  CO2 20 - 29 mmol/L 28 27 22   Calcium 8.7 - 10.2 mg/dL 9.5 ) 9.0  Total Protein 6.0 - 8.5 g/dL 7.5 - 7.2  Total Bilirubin 0.0 - 1.2 mg/dL 0.4 - 0.6  Alkaline Phos 39 - 117 IU/L 109 - 88  AST 0 - 40 IU/L 23 - 13  ALT 0 - 44 IU/L 17 - 12   Lipid Panel     Component Value Date/Time   CHOL 182 04/15/2019 1634   TRIG 137 04/15/2019 1634   HDL 66 04/15/2019 1634   CHOLHDL 2.8 04/15/2019 1634   CHOLHDL 3.1 03/31/2015 1148   VLDL 18 03/31/2015 1148   LDLCALC 92 04/15/2019 1634    CBC    Component Value Date/Time   WBC 9.2 12/12/2018 0654   RBC 4.15 (L) 12/12/2018 0654   HGB 11.9 (L) 12/12/2018 0654   HGB 13.6 06/26/2018 1206   HCT 39.4 12/12/2018 0654   HCT 41.8 06/26/2018 1206   PLT 173 12/12/2018 0654   PLT 210 06/26/2018 1206   MCV 94.9 12/12/2018 0654   MCV 89 06/26/2018 1206  MCH 28.7 12/12/2018 0654   MCHC 30.2 12/12/2018 0654   RDW 14.8 12/12/2018 0654   RDW 12.9 06/26/2018 1206   LYMPHSABS 1.6 12/12/2018 0654   LYMPHSABS 2.6 06/26/2018 1206   MONOABS 1.0 12/12/2018 0654   EOSABS 0.4 12/12/2018 0654   EOSABS 0.1 06/26/2018 1206   BASOSABS 0.0 12/12/2018 0654   BASOSABS 0.0 06/26/2018 1206    ASSESSMENT AND PLAN:  1. Essential hypertension Not at goal.  He has not taken all of his medicines as yet for today.  Continue to encourage compliance.  Encouraged him to take the furosemide if only several days a week to help decrease the swelling in his legs. - furosemide (LASIX)  40 MG tablet; Take 1 tablet (40 mg total) by mouth daily.  Dispense: 30 tablet; Refill: 5 - amLODipine (NORVASC) 5 MG tablet; Take 1.5 tablets (7.5 mg total) by mouth daily.  Dispense: 45 tablet; Refill: 6 - isosorbide dinitrate (ISORDIL) 20 MG tablet; Take 2 tablets (40 mg total) by mouth 3 (three) times daily.  Dispense: 180 tablet; Refill: 6  2. Primary osteoarthritis of left knee Weight loss strongly encouraged. Recommend referral to pain management versus trying him with Tylenol with codeine.  Patient wants to try the Tylenol with Codeine first.  We updated his controlled substance prescribing agreement and he was agreeable to doing a urine drug screen today.  [place] controlled substance reporting system reviewed. - Acetaminophen-Codeine (TYLENOL/CODEINE #3) 300-30 MG tablet; Take 1 tablet by mouth every 6 (six) hours as needed for pain.  Dispense: 120 tablet; Refill: 0  3. Morbid obesity due to excess calories (HCC) Weight loss strongly encouraged.  Discussed healthy eating habits.  Recommend healthier snacks like fruits of vegetables.  He is agreeable to referral to medical weight management - Amb Ref to Medical Weight Management  4. Cardiomyopathy due to hypertension, without heart failure (HCC) Heart function significantly improved.  Continue current medications  5. Depression, unspecified depression type Patient requested refill on Zoloft - sertraline (ZOLOFT) 50 MG tablet; Take 1 tablet (50 mg total) by mouth daily.  Dispense: 30 tablet; Refill: 5  6. COVID-19 virus vaccination declined Discussed COVID-19 vaccine and recommend that he gets the vaccine.  Patient declined  7. Screening for colon cancer He is overdue for colon cancer screening.  We discussed colon cancer screening methods.  He is agreeable to doing Cologuard instead of colonoscopy - Cologuard  8. Stage 3a chronic kidney disease - Basic Metabolic Panel  9. Controlled substance agreement signed - [id number]  11+Oxyco+Alc+Crt-Bund   Patient was given the opportunity to ask questions.  Patient verbalized understanding of the plan and was able to repeat key elements of the plan.   No orders of the defined types were placed in this encounter.    Requested Prescriptions    No prescriptions requested or ordered in this encounter    No follow-ups on file.  Jonah Blue, MD, FACP

## 2019-10-15 DIAGNOSIS — G4733 Obstructive sleep apnea (adult) (pediatric): Secondary | ICD-10-CM | POA: Diagnosis not present

## 2019-10-15 LAB — BASIC METABOLIC PANEL
BUN/Creatinine Ratio: 16 (ref 9–20)
BUN: 21 mg/dL (ref 6–24)
CO2: 29 mmol/L (ref 20–29)
Calcium: 9.4 mg/dL (ref 8.7–10.2)
Chloride: 99 mmol/L (ref 96–106)
Creatinine, Ser: 1.35 mg/dL — ABNORMAL HIGH (ref 0.76–1.27)
GFR calc Af Amer: 68 mL/min/{1.73_m2} (ref >59)
GFR calc non Af Amer: 59 mL/min/{1.73_m2} — ABNORMAL LOW (ref >59)
Glucose: 90 mg/dL (ref 65–99)
Potassium: 4 mmol/L (ref 3.5–5.2)
Sodium: 143 mmol/L (ref 134–144)

## 2019-10-15 MED FILL — FUROSEMIDE 40 MG TAB: 40 | 30 days supply | Qty: 30 | Fill #0

## 2019-10-15 MED FILL — ACETAMINOPHEN/COD #3 TABLET: 300-30 | 30 days supply | Qty: 120 | Fill #0

## 2019-10-17 LAB — DRUG SCREEN 764883 11+OXYCO+ALC+CRT-BUND
Amphetamines, Urine: NEGATIVE ng/mL
BENZODIAZ UR QL: NEGATIVE ng/mL
Barbiturate: NEGATIVE ng/mL
Cannabinoid Quant, Ur: NEGATIVE ng/mL
Cocaine (Metabolite): NEGATIVE ng/mL
Creatinine: 226.9 mg/dL (ref 20.0–300.0)
Ethanol: NEGATIVE %
Meperidine: NEGATIVE ng/mL
Methadone Screen, Urine: NEGATIVE ng/mL
OPIATE SCREEN URINE: NEGATIVE ng/mL
Oxycodone/Oxymorphone, Urine: NEGATIVE ng/mL
Phencyclidine: NEGATIVE ng/mL
Propoxyphene: NEGATIVE ng/mL
pH, Urine: 5.1 (ref 4.5–8.9)

## 2019-10-17 LAB — TRAMADOL GC/MS, URINE
Tramadol gc/ms Conf: >10000 ng/mL
Tramadol: POSITIVE — AB

## 2019-10-18 ENCOUNTER — Other Ambulatory Visit: Payer: Self-pay

## 2019-10-18 ENCOUNTER — Ambulatory Visit (HOSPITAL_COMMUNITY)
Admission: EM | Admit: 2019-10-18 | Discharge: 2019-10-18 | Disposition: A | Discharge status: Home / Self Care | Payer: BC Managed Care – PPO

## 2019-10-18 DIAGNOSIS — M25562 Pain in left knee: Secondary | ICD-10-CM | POA: Diagnosis not present

## 2019-10-18 DIAGNOSIS — M159 Polyosteoarthritis, unspecified: Secondary | ICD-10-CM

## 2019-10-18 DIAGNOSIS — I10 Essential (primary) hypertension: Secondary | ICD-10-CM

## 2019-10-18 DIAGNOSIS — I5042 Chronic combined systolic (congestive) and diastolic (congestive) heart failure: Secondary | ICD-10-CM

## 2019-10-18 DIAGNOSIS — R030 Elevated blood-pressure reading, without diagnosis of hypertension: Secondary | ICD-10-CM

## 2019-10-18 DIAGNOSIS — M5136 Other intervertebral disc degeneration, lumbar region: Secondary | ICD-10-CM

## 2019-10-18 NOTE — ED Provider Notes (Signed)
[facility]   MRN: [id number] DOB: 11/24/1965  Subjective:   Rickey Calderon is a 54 y.o. male presenting for 1 day history of right-sided knee pain.  Patient states that he bent down slightly at the level of his knee and waist to pick up a heavy object and felt a sudden sharp pain.  Also reports hearing a pop.  He just filled Tylenol #3 and has used diclofenac gel with some relief.  Patient does have an orthopedist, Dr. Roda Shutters, is working with him on getting a left knee replacement for primary OA.  Also has degenerative disc disease of the cervical and lumbar spine.  Has a history of combined systolic and diastolic heart failure.  Denies history of diabetes.  Denies fall, trauma, redness, warmth, history of gout.  No current facility-administered medications for this encounter.  Current Outpatient Medications:  .  Acetaminophen-Codeine (TYLENOL/CODEINE #3) 300-30 MG tablet, Take 1 tablet by mouth every 6 (six) hours as needed for pain., Disp: 120 tablet, Rfl: 0 .  albuterol (VENTOLIN HFA) 108 (90 Base) MCG/ACT inhaler, Inhale 2 puffs into the lungs every 6 (six) hours as needed for wheezing or shortness of breath., Disp: 8 g, Rfl: 6 .  allopurinol (ZYLOPRIM) 300 MG tablet, TAKE 1 TABLET (300 MG TOTAL) BY MOUTH DAILY., Disp: 30 tablet, Rfl: 5 .  amLODipine (NORVASC) 5 MG tablet, Take 1.5 tablets (7.5 mg total) by mouth daily., Disp: 45 tablet, Rfl: 6 .  carvedilol (COREG) 12.5 MG tablet, TAKE 1 TABLET (12.5 MG TOTAL) BY MOUTH 2 (TWO) TIMES DAILY WITH A MEAL., Disp: 60 tablet, Rfl: 3 .  cetirizine (ZYRTEC) 10 MG tablet, Take 1 tablet (10 mg total) by mouth daily., Disp: 30 tablet, Rfl: 3 .  cyclobenzaprine (FLEXERIL) 10 MG tablet, TAKE 1 TABLET BY MOUTH TWO TIMES DAILY AS NEEDED, Disp: 40 tablet, Rfl: 0 .  Diclofenac Sodium 3 % GEL, APPLY 2-4 TIMES DAILY TO LT KNEE AS NEEDED, Disp: 100 g, Rfl: 0 .  furosemide (LASIX) 40 MG tablet, Take 1 tablet (40 mg total) by mouth daily., Disp: 30 tablet,  Rfl: 5 .  gabapentin (NEURONTIN) 300 MG capsule, Take 1 capsule (300 mg total) by mouth 3 (three) times daily., Disp: 90 capsule, Rfl: 5 .  hydrALAZINE (APRESOLINE) 25 MG tablet, TAKE 3 TABLETS (75 MG TOTAL) BY MOUTH 3 (THREE) TIMES DAILY., Disp: 270 tablet, Rfl: 2 .  isosorbide dinitrate (ISORDIL) 20 MG tablet, Take 2 tablets (40 mg total) by mouth 3 (three) times daily., Disp: 180 tablet, Rfl: 6 .  methocarbamol (ROBAXIN) 500 MG tablet, Take 1 tablet (500 mg total) by mouth 2 (two) times daily., Disp: 20 tablet, Rfl: 0 .  Potassium Chloride ER 20 MEQ TBCR, Take 1 tablet by mouth daily., Disp: 30 tablet, Rfl: 5 .  sertraline (ZOLOFT) 50 MG tablet, Take 1 tablet (50 mg total) by mouth daily., Disp: 30 tablet, Rfl: 5 .  tamsulosin (FLOMAX) 0.4 MG CAPS capsule, TAKE 1 CAPSULE BY MOUTH DAILY AFTER SUPPER., Disp: 30 capsule, Rfl: 6   No Known Allergies  Past Medical History:  Diagnosis Date  . Asthma   . Bronchitis   . CHF (congestive heart failure) (HCC)    EF 30% by echo,  EF 50% Nov 2017  . Depression   . Femoral thrombosis (HCC)   . Gout   . Hypertension   . Morbid obesity (HCC)   . Prostatitis   . Seasonal allergies   . Sleep apnea    BiPAP  Past Surgical History:  Procedure Laterality Date  . ABDOMINAL AORTAGRAM N/A 07/28/2014   Procedure: ABDOMINAL Maxcine Ham;  Surgeon: Angelia Mould, MD;  Location: [facility] CATH LAB;  Service: Cardiovascular;  Laterality: N/A;  . LOWER EXTREMITY ANGIOGRAM Bilateral 07/28/2014   Procedure: LOWER EXTREMITY ANGIOGRAM;  Surgeon: Angelia Mould, MD;  Location: [facility] CATH LAB;  Service: Cardiovascular;  Laterality: Bilateral;  . THROMBECTOMY FEMORAL ARTERY Right 08/01/2014   Procedure: THROMBECTOMY RIGHT LEG SFA, Anterior tibial, and perineal arteries with intraoperative arteriograms;  Surgeon: Serafina Mitchell, MD;  Location: [facility] OR;  Service: Vascular;  Laterality: Right;    Family History  Problem Relation Age of Onset  . Diabetes Father   .  Hyperlipidemia Father     Social History   Tobacco Use  . Smoking status: Never Smoker  . Smokeless tobacco: Never Used  Substance Use Topics  . Alcohol use: No  . Drug use: No    ROS   Objective:   Vitals: BP (!) 156/107   Pulse 74   Temp 98.4 F (36.9 C)   Resp 16   SpO2 100%   Physical Exam Constitutional:      General: He is not in acute distress.    Appearance: Normal appearance. He is well-developed and normal weight. He is not ill-appearing, toxic-appearing or diaphoretic.  HENT:     Head: Normocephalic and atraumatic.     Right Ear: External ear normal.     Left Ear: External ear normal.     Nose: Nose normal.     Mouth/Throat:     Pharynx: Oropharynx is clear.  Eyes:     General: No scleral icterus.       Right eye: No discharge.        Left eye: No discharge.     Extraocular Movements: Extraocular movements intact.     Pupils: Pupils are equal, round, and reactive to light.  Cardiovascular:     Rate and Rhythm: Normal rate.  Pulmonary:     Effort: Pulmonary effort is normal.  Musculoskeletal:     Cervical back: Normal range of motion.     Right knee: Bony tenderness present. No swelling, deformity, effusion, erythema, ecchymosis, lacerations or crepitus. Decreased range of motion. Tenderness (over area outlined) present. Normal alignment and normal patellar mobility.       Legs:  Neurological:     Mental Status: He is alert and oriented to person, place, and time.  Psychiatric:        Mood and Affect: Mood normal.        Behavior: Behavior normal.        Thought Content: Thought content normal.        Judgment: Judgment normal.      Assessment and Plan :   PDMP not reviewed this encounter.  1. Acute pain of left knee   2. Osteoarthritis of multiple joints, unspecified osteoarthritis type   3. Degenerative disc disease, lumbar   4. Morbid obesity ([place])   5. Essential hypertension   6. Elevated blood pressure reading   7. Chronic  combined systolic and diastolic congestive heart failure ([place])     Patient is high risk for both NSAID and steroid course given his hypertension, combined systolic and diastolic CHF.  He already has Tylenol #3, would be inappropriate for me to prescribe another narcotic pain medication especially since he just filled his current prescription.  Recommended that his best option is to follow-up with Dr. Erlinda Hong for a local  steroid injection to his right knee for his osteoarthritis.  Information to Dr. Warren Danes practice provided back to the patient. Note for work provided to the patient.  Counseled patient on potential for adverse effects with medications prescribed/recommended today, ER and return-to-clinic precautions discussed, patient verbalized understanding.    Wallis Bamberg, PA-C 10/18/19 1010

## 2019-10-18 NOTE — Discharge Instructions (Addendum)
Please make sure you contact Dr. Roda Shutters for a knee injection of your right knee. Schedule Tylenol 500mg -650mg  once every 6 hours as needed for pain. Use Tylenol #3 for breakthrough pain. Unfortunately, I cannot do steroids and risk putting you back into heart failure. But with Dr. , you can get by this by receiving a steroid injection to your right knee.

## 2019-10-18 NOTE — ED Triage Notes (Signed)
Pt c/o R knee pain after bending over to pick up heavy object and hearing a "pop"

## 2019-11-01 ENCOUNTER — Telehealth: Payer: Self-pay | Admitting: Internal Medicine

## 2019-11-01 DIAGNOSIS — M1712 Unilateral primary osteoarthritis, left knee: Secondary | ICD-10-CM

## 2019-11-01 NOTE — Telephone Encounter (Signed)
Patient called requesting a referral to the pain clinic. Patient states it's because of his knee pain.

## 2019-11-01 NOTE — Telephone Encounter (Signed)
Will forward to pcp

## 2019-11-06 ENCOUNTER — Encounter: Payer: Self-pay | Admitting: Internal Medicine

## 2019-11-06 ENCOUNTER — Telehealth: Payer: Self-pay | Admitting: Internal Medicine

## 2019-11-06 ENCOUNTER — Ambulatory Visit: Payer: BC Managed Care – PPO | Admitting: Orthopaedic Surgery

## 2019-11-06 NOTE — Telephone Encounter (Signed)
Patient's wife stopped by the office today and turned in death certificate form for pcp to fill out.

## 2019-11-06 NOTE — Progress Notes (Signed)
I received notice from [facility] [employer] [facility] yesterday requesting me to sign off on the patient's death certificate.  Phone call placed to the funeral home this morning.  I spoke with [name].  Informed me that the patient died at home on 11-28-19.  I told her that I will need to contact the patient's spouse to get more information surrounding his death before I can fill out the death certificate. Phone call placed to patient's wife Rickey Calderon.  She tells me that the patient lived with his girlfriend Rickey Calderon and that I would probably need to speak with her to get information.  The only thing she was that he collapsed at home. I contacted the girlfriend Rickey Calderon.  She told me that on Saturday the patient was complaining of his legs hurting mainly his knee.  He got up several times to use the bathroom.  The last time he got up she heard a big thump.  When she went to check on him she had difficulty getting the bathroom door open because he was on the floor laying between the toilet and the tub.  She called 911 around 12 PM.  When they arrived they worked on him including having to shock him several times but was unable to revive him.  She states being told by one of the EMS workers that he most likely had a heart attack.  EMS left the dwelling around 1:30 PM.  I thanked her for the information and gave her my condolences.

## 2019-11-07 ENCOUNTER — Telehealth: Payer: Self-pay | Admitting: Internal Medicine

## 2019-11-07 NOTE — Telephone Encounter (Signed)
A rep from [employer] came into the office and picked up the patients death certificate.

## 2019-11-07 NOTE — Telephone Encounter (Signed)
Contacted funeral home to get the time of death per Dr. Laural Benes. Per funeral home pt dies at 12:56pm. Also made funeral home aware that death certificate is ready for pick up.   Gave certificate to [place] up front

## 2019-11-26 DEATH — deceased
